# Patient Record
Sex: Male | Born: 1960 | Race: White | Hispanic: No | Marital: Married | Smoking: Light tobacco smoker
Health system: Southern US, Community
[De-identification: ages and names within clinical notes are randomized; demographics above are authoritative.]

## PROBLEM LIST (undated history)

## (undated) DIAGNOSIS — N529 Male erectile dysfunction, unspecified: Secondary | ICD-10-CM

## (undated) DIAGNOSIS — I10 Essential (primary) hypertension: Secondary | ICD-10-CM

## (undated) DIAGNOSIS — R0600 Dyspnea, unspecified: Secondary | ICD-10-CM

## (undated) DIAGNOSIS — Z8679 Personal history of other diseases of the circulatory system: Secondary | ICD-10-CM

## (undated) DIAGNOSIS — Z9289 Personal history of other medical treatment: Secondary | ICD-10-CM

## (undated) DIAGNOSIS — C61 Malignant neoplasm of prostate: Secondary | ICD-10-CM

## (undated) DIAGNOSIS — K219 Gastro-esophageal reflux disease without esophagitis: Secondary | ICD-10-CM

## (undated) DIAGNOSIS — Z87442 Personal history of urinary calculi: Secondary | ICD-10-CM

## (undated) DIAGNOSIS — Z8619 Personal history of other infectious and parasitic diseases: Secondary | ICD-10-CM

## (undated) DIAGNOSIS — Z9189 Other specified personal risk factors, not elsewhere classified: Secondary | ICD-10-CM

## (undated) DIAGNOSIS — E119 Type 2 diabetes mellitus without complications: Secondary | ICD-10-CM

## (undated) DIAGNOSIS — S83206A Unspecified tear of unspecified meniscus, current injury, right knee, initial encounter: Secondary | ICD-10-CM

## (undated) DIAGNOSIS — Z87448 Personal history of other diseases of urinary system: Secondary | ICD-10-CM

## (undated) DIAGNOSIS — Z973 Presence of spectacles and contact lenses: Secondary | ICD-10-CM

## (undated) HISTORY — PX: EXTRACORPOREAL SHOCK WAVE LITHOTRIPSY: SHX1557

## (undated) HISTORY — DX: Personal history of urinary calculi: Z87.442

## (undated) HISTORY — PX: PERCUTANEOUS NEPHROSTOLITHOTOMY: SHX2207

## (undated) HISTORY — PX: URETEROLITHOTOMY: SHX71

## (undated) HISTORY — DX: Essential (primary) hypertension: I10

## (undated) HISTORY — DX: Type 2 diabetes mellitus without complications: E11.9

## (undated) HISTORY — PX: TRIGGER FINGER RELEASE: SHX641

## (undated) HISTORY — DX: Male erectile dysfunction, unspecified: N52.9

## (undated) HISTORY — PX: CATARACT EXTRACTION W/ INTRAOCULAR LENS  IMPLANT, BILATERAL: SHX1307

## (undated) HISTORY — PX: PROSTATE BIOPSY: SHX241

## (undated) HISTORY — PX: ORCHIECTOMY: SHX2116

## (undated) HISTORY — PX: TONSILLECTOMY AND ADENOIDECTOMY: SUR1326

---

## 1998-08-13 ENCOUNTER — Encounter: Payer: Self-pay | Admitting: Urology

## 1998-08-13 ENCOUNTER — Emergency Department (HOSPITAL_COMMUNITY): Admission: EM | Admit: 1998-08-13 | Discharge: 1998-08-13 | Payer: Self-pay | Admitting: Emergency Medicine

## 1998-08-13 ENCOUNTER — Ambulatory Visit (HOSPITAL_COMMUNITY): Admission: RE | Admit: 1998-08-13 | Discharge: 1998-08-13 | Payer: Self-pay | Admitting: Urology

## 2005-02-27 ENCOUNTER — Encounter: Admission: RE | Admit: 2005-02-27 | Discharge: 2005-02-27 | Payer: Self-pay | Admitting: Urology

## 2005-03-05 ENCOUNTER — Ambulatory Visit (HOSPITAL_COMMUNITY): Admission: RE | Admit: 2005-03-05 | Discharge: 2005-03-05 | Payer: Self-pay | Admitting: Urology

## 2005-03-05 ENCOUNTER — Ambulatory Visit (HOSPITAL_BASED_OUTPATIENT_CLINIC_OR_DEPARTMENT_OTHER): Admission: RE | Admit: 2005-03-05 | Discharge: 2005-03-05 | Payer: Self-pay | Admitting: Urology

## 2010-09-10 ENCOUNTER — Other Ambulatory Visit: Payer: Self-pay | Admitting: Gastroenterology

## 2010-09-10 DIAGNOSIS — B182 Chronic viral hepatitis C: Secondary | ICD-10-CM

## 2010-10-03 ENCOUNTER — Ambulatory Visit
Admission: RE | Admit: 2010-10-03 | Discharge: 2010-10-03 | Disposition: A | Discharge status: Home / Self Care | Payer: 59 | Source: Ambulatory Visit | Attending: Gastroenterology | Admitting: Gastroenterology

## 2010-10-03 DIAGNOSIS — B182 Chronic viral hepatitis C: Secondary | ICD-10-CM

## 2011-11-03 ENCOUNTER — Ambulatory Visit (INDEPENDENT_AMBULATORY_CARE_PROVIDER_SITE_OTHER): Payer: 59 | Admitting: Internal Medicine

## 2011-11-03 ENCOUNTER — Encounter: Payer: Self-pay | Admitting: Internal Medicine

## 2011-11-03 VITALS — BP 121/79 | HR 68 | Temp 98.4°F | Resp 16 | Ht 67.25 in | Wt 232.8 lb

## 2011-11-03 DIAGNOSIS — I10 Essential (primary) hypertension: Secondary | ICD-10-CM

## 2011-11-03 DIAGNOSIS — B1920 Unspecified viral hepatitis C without hepatic coma: Secondary | ICD-10-CM

## 2011-11-03 DIAGNOSIS — N200 Calculus of kidney: Secondary | ICD-10-CM

## 2011-11-03 DIAGNOSIS — Z79899 Other long term (current) drug therapy: Secondary | ICD-10-CM

## 2011-11-03 DIAGNOSIS — N529 Male erectile dysfunction, unspecified: Secondary | ICD-10-CM

## 2011-11-03 MED ORDER — LISINOPRIL-HYDROCHLOROTHIAZIDE 20-12.5 MG PO TABS: 1.0000 | ORAL_TABLET | Freq: Every day | ORAL | Status: DC

## 2011-11-03 MED ORDER — SILDENAFIL CITRATE 100 MG PO TABS: 100.0000 mg | ORAL_TABLET | Freq: Every day | ORAL | Status: DC | PRN

## 2011-11-04 NOTE — Progress Notes (Signed)
  Subjective:    Patient ID: Travis Bowman, male    DOB: [birth year], 51 y.o.   MRN: [id number]  HPI  Doing well  Review of Systems     Objective:   Physical Exam  Normal      Assessment & Plan:  Continue same rx

## 2011-12-22 ENCOUNTER — Other Ambulatory Visit: Payer: Self-pay | Admitting: Family Medicine

## 2011-12-22 DIAGNOSIS — R140 Abdominal distension (gaseous): Secondary | ICD-10-CM

## 2011-12-23 ENCOUNTER — Ambulatory Visit
Admission: RE | Admit: 2011-12-23 | Discharge: 2011-12-23 | Disposition: A | Discharge status: Home / Self Care | Payer: 59 | Source: Ambulatory Visit | Attending: Family Medicine | Admitting: Family Medicine

## 2011-12-23 DIAGNOSIS — R140 Abdominal distension (gaseous): Secondary | ICD-10-CM

## 2011-12-24 ENCOUNTER — Other Ambulatory Visit: Payer: Self-pay | Admitting: Family Medicine

## 2011-12-24 DIAGNOSIS — K869 Disease of pancreas, unspecified: Secondary | ICD-10-CM

## 2011-12-25 ENCOUNTER — Ambulatory Visit
Admission: RE | Admit: 2011-12-25 | Discharge: 2011-12-25 | Disposition: A | Discharge status: Home / Self Care | Payer: 59 | Source: Ambulatory Visit | Attending: Family Medicine | Admitting: Family Medicine

## 2011-12-25 DIAGNOSIS — K869 Disease of pancreas, unspecified: Secondary | ICD-10-CM

## 2011-12-25 MED ORDER — IOHEXOL 350 MG/ML SOLN
125.0000 mL | Freq: Once | INTRAVENOUS | Status: AC | PRN
  Administered 2011-12-25: 125 mL via INTRAVENOUS

## 2012-01-29 ENCOUNTER — Other Ambulatory Visit: Payer: Self-pay | Admitting: Internal Medicine

## 2013-07-22 ENCOUNTER — Other Ambulatory Visit: Payer: Self-pay | Admitting: Gastroenterology

## 2013-07-22 DIAGNOSIS — B1920 Unspecified viral hepatitis C without hepatic coma: Secondary | ICD-10-CM

## 2013-07-27 ENCOUNTER — Ambulatory Visit
Admission: RE | Admit: 2013-07-27 | Discharge: 2013-07-27 | Disposition: A | Discharge status: Home / Self Care | Payer: 59 | Source: Ambulatory Visit | Attending: Gastroenterology | Admitting: Gastroenterology

## 2013-07-27 DIAGNOSIS — B1920 Unspecified viral hepatitis C without hepatic coma: Secondary | ICD-10-CM

## 2013-08-19 ENCOUNTER — Other Ambulatory Visit: Payer: Self-pay | Admitting: Gastroenterology

## 2013-08-19 DIAGNOSIS — B1920 Unspecified viral hepatitis C without hepatic coma: Secondary | ICD-10-CM

## 2013-08-26 ENCOUNTER — Ambulatory Visit
Admission: RE | Admit: 2013-08-26 | Discharge: 2013-08-26 | Disposition: A | Discharge status: Home / Self Care | Payer: 59 | Source: Ambulatory Visit | Attending: Gastroenterology | Admitting: Gastroenterology

## 2013-08-26 ENCOUNTER — Other Ambulatory Visit: Payer: 59

## 2013-08-26 DIAGNOSIS — B1920 Unspecified viral hepatitis C without hepatic coma: Secondary | ICD-10-CM

## 2013-08-26 MED ORDER — GADOXETATE DISODIUM 0.25 MMOL/ML IV SOLN
10.0000 mL | Freq: Once | INTRAVENOUS | Status: AC | PRN
  Administered 2013-08-26: 10 mL via INTRAVENOUS

## 2013-11-11 ENCOUNTER — Ambulatory Visit
Admission: RE | Admit: 2013-11-11 | Discharge: 2013-11-11 | Disposition: A | Discharge status: Home / Self Care | Payer: 59 | Source: Ambulatory Visit | Attending: Family Medicine | Admitting: Family Medicine

## 2013-11-11 ENCOUNTER — Other Ambulatory Visit: Payer: Self-pay | Admitting: Family Medicine

## 2013-11-11 DIAGNOSIS — M25439 Effusion, unspecified wrist: Secondary | ICD-10-CM

## 2013-11-25 ENCOUNTER — Ambulatory Visit
Admission: RE | Admit: 2013-11-25 | Discharge: 2013-11-25 | Disposition: A | Discharge status: Home / Self Care | Payer: 59 | Source: Ambulatory Visit | Attending: Family Medicine | Admitting: Family Medicine

## 2013-11-25 ENCOUNTER — Other Ambulatory Visit: Payer: Self-pay | Admitting: Family Medicine

## 2013-11-25 DIAGNOSIS — R0602 Shortness of breath: Secondary | ICD-10-CM

## 2013-12-05 ENCOUNTER — Inpatient Hospital Stay (HOSPITAL_COMMUNITY)
Admission: AD | Admit: 2013-12-05 | Discharge: 2013-12-09 | DRG: 682 | Disposition: A | Discharge status: Home / Self Care | Payer: 59 | Source: Ambulatory Visit | Attending: Internal Medicine | Admitting: Internal Medicine

## 2013-12-05 ENCOUNTER — Encounter (HOSPITAL_COMMUNITY): Payer: Self-pay | Admitting: *Deleted

## 2013-12-05 DIAGNOSIS — N049 Nephrotic syndrome with unspecified morphologic changes: Secondary | ICD-10-CM | POA: Diagnosis present

## 2013-12-05 DIAGNOSIS — Z8249 Family history of ischemic heart disease and other diseases of the circulatory system: Secondary | ICD-10-CM

## 2013-12-05 DIAGNOSIS — R809 Proteinuria, unspecified: Secondary | ICD-10-CM

## 2013-12-05 DIAGNOSIS — R7989 Other specified abnormal findings of blood chemistry: Secondary | ICD-10-CM

## 2013-12-05 DIAGNOSIS — I10 Essential (primary) hypertension: Secondary | ICD-10-CM

## 2013-12-05 DIAGNOSIS — Z9079 Acquired absence of other genital organ(s): Secondary | ICD-10-CM

## 2013-12-05 DIAGNOSIS — I509 Heart failure, unspecified: Secondary | ICD-10-CM | POA: Diagnosis present

## 2013-12-05 DIAGNOSIS — I129 Hypertensive chronic kidney disease with stage 1 through stage 4 chronic kidney disease, or unspecified chronic kidney disease: Secondary | ICD-10-CM | POA: Diagnosis present

## 2013-12-05 DIAGNOSIS — N189 Chronic kidney disease, unspecified: Secondary | ICD-10-CM | POA: Diagnosis present

## 2013-12-05 DIAGNOSIS — E1110 Type 2 diabetes mellitus with ketoacidosis without coma: Secondary | ICD-10-CM

## 2013-12-05 DIAGNOSIS — Z79899 Other long term (current) drug therapy: Secondary | ICD-10-CM

## 2013-12-05 DIAGNOSIS — B1920 Unspecified viral hepatitis C without hepatic coma: Secondary | ICD-10-CM | POA: Diagnosis present

## 2013-12-05 DIAGNOSIS — R319 Hematuria, unspecified: Secondary | ICD-10-CM

## 2013-12-05 DIAGNOSIS — B182 Chronic viral hepatitis C: Secondary | ICD-10-CM | POA: Diagnosis present

## 2013-12-05 DIAGNOSIS — N200 Calculus of kidney: Secondary | ICD-10-CM

## 2013-12-05 DIAGNOSIS — I160 Hypertensive urgency: Secondary | ICD-10-CM

## 2013-12-05 DIAGNOSIS — E119 Type 2 diabetes mellitus without complications: Secondary | ICD-10-CM

## 2013-12-05 DIAGNOSIS — Z87891 Personal history of nicotine dependence: Secondary | ICD-10-CM

## 2013-12-05 DIAGNOSIS — N179 Acute kidney failure, unspecified: Principal | ICD-10-CM | POA: Diagnosis present

## 2013-12-05 DIAGNOSIS — E1310 Other specified diabetes mellitus with ketoacidosis without coma: Secondary | ICD-10-CM

## 2013-12-05 LAB — COMPREHENSIVE METABOLIC PANEL
ALT: 14 U/L (ref 0–53)
AST: 19 U/L (ref 0–37)
Albumin: 3.1 g/dL — ABNORMAL LOW (ref 3.5–5.2)
Alkaline Phosphatase: 35 U/L — ABNORMAL LOW (ref 39–117)
BUN: 44 mg/dL — ABNORMAL HIGH (ref 6–23)
CO2: 24 mEq/L (ref 19–32)
Calcium: 10 mg/dL (ref 8.4–10.5)
Chloride: 104 mEq/L (ref 96–112)
Creatinine, Ser: 1.42 mg/dL — ABNORMAL HIGH (ref 0.50–1.35)
GFR calc Af Amer: 64 mL/min — ABNORMAL LOW (ref >90)
GFR calc non Af Amer: 55 mL/min — ABNORMAL LOW (ref >90)
Glucose, Bld: 96 mg/dL (ref 70–99)
Potassium: 4 mEq/L (ref 3.7–5.3)
Sodium: 141 mEq/L (ref 137–147)
Total Bilirubin: 0.5 mg/dL (ref 0.3–1.2)
Total Protein: 6.5 g/dL (ref 6.0–8.3)

## 2013-12-05 LAB — CBC WITH DIFFERENTIAL/PLATELET
Basophils Absolute: 0 10*3/uL (ref 0.0–0.1)
Basophils Relative: 0 % (ref 0–1)
Eosinophils Absolute: 0.3 10*3/uL (ref 0.0–0.7)
Eosinophils Relative: 3 % (ref 0–5)
HCT: 33.4 % — ABNORMAL LOW (ref 39.0–52.0)
Hemoglobin: 11.2 g/dL — ABNORMAL LOW (ref 13.0–17.0)
Lymphocytes Relative: 33 % (ref 12–46)
Lymphs Abs: 2.7 10*3/uL (ref 0.7–4.0)
MCH: 30.5 pg (ref 26.0–34.0)
MCHC: 33.5 g/dL (ref 30.0–36.0)
MCV: 91 fL (ref 78.0–100.0)
Monocytes Absolute: 0.6 10*3/uL (ref 0.1–1.0)
Monocytes Relative: 8 % (ref 3–12)
Neutro Abs: 4.7 10*3/uL (ref 1.7–7.7)
Neutrophils Relative %: 56 % (ref 43–77)
Platelets: 140 10*3/uL — ABNORMAL LOW (ref 150–400)
RBC: 3.67 MIL/uL — ABNORMAL LOW (ref 4.22–5.81)
RDW: 13.5 % (ref 11.5–15.5)
WBC: 8.3 10*3/uL (ref 4.0–10.5)

## 2013-12-05 LAB — GLUCOSE, CAPILLARY: Glucose-Capillary: 97 mg/dL (ref 70–99)

## 2013-12-05 LAB — PHOSPHORUS: Phosphorus: 5.7 mg/dL — ABNORMAL HIGH (ref 2.3–4.6)

## 2013-12-05 LAB — PRO B NATRIURETIC PEPTIDE: Pro B Natriuretic peptide (BNP): 1188 pg/mL — ABNORMAL HIGH (ref 0–125)

## 2013-12-05 LAB — TROPONIN I: Troponin I: <0.3 ng/mL (ref <0.30)

## 2013-12-05 LAB — MAGNESIUM: Magnesium: 2.1 mg/dL (ref 1.5–2.5)

## 2013-12-05 MED ORDER — FUROSEMIDE 80 MG PO TABS
80.0000 mg | ORAL_TABLET | Freq: Two times a day (BID) | ORAL | Status: DC
  Administered 2013-12-06 – 2013-12-09 (×7): 80 mg via ORAL
  Filled 2013-12-05 (×9): qty 1

## 2013-12-05 MED ORDER — ACETAMINOPHEN 650 MG RE SUPP: 650.0000 mg | Freq: Four times a day (QID) | RECTAL | Status: DC | PRN

## 2013-12-05 MED ORDER — ACETAMINOPHEN 325 MG PO TABS: 650.0000 mg | ORAL_TABLET | Freq: Four times a day (QID) | ORAL | Status: DC | PRN

## 2013-12-05 MED ORDER — INSULIN ASPART 100 UNIT/ML ~~LOC~~ SOLN
0.0000 [IU] | SUBCUTANEOUS | Status: DC
  Administered 2013-12-06: 1 [IU] via SUBCUTANEOUS
  Administered 2013-12-07: 5 [IU] via SUBCUTANEOUS
  Administered 2013-12-08: 2 [IU] via SUBCUTANEOUS
  Administered 2013-12-08: 5 [IU] via SUBCUTANEOUS
  Administered 2013-12-08 (×3): 2 [IU] via SUBCUTANEOUS

## 2013-12-05 MED ORDER — CLONIDINE HCL 0.1 MG PO TABS
0.1000 mg | ORAL_TABLET | Freq: Two times a day (BID) | ORAL | Status: DC
  Filled 2013-12-05 (×2): qty 1

## 2013-12-05 MED ORDER — SODIUM CHLORIDE 0.9 % IJ SOLN
3.0000 mL | Freq: Two times a day (BID) | INTRAMUSCULAR | Status: DC
  Administered 2013-12-05 – 2013-12-09 (×7): 3 mL via INTRAVENOUS

## 2013-12-05 MED ORDER — SODIUM CHLORIDE 0.9 % IJ SOLN: 3.0000 mL | INTRAMUSCULAR | Status: DC | PRN

## 2013-12-05 MED ORDER — HYDROCODONE-ACETAMINOPHEN 5-325 MG PO TABS: 1.0000 | ORAL_TABLET | ORAL | Status: DC | PRN

## 2013-12-05 MED ORDER — DOCUSATE SODIUM 100 MG PO CAPS
100.0000 mg | ORAL_CAPSULE | Freq: Two times a day (BID) | ORAL | Status: DC
Start: 2013-12-05 — End: 2013-12-06
  Filled 2013-12-05 (×3): qty 1

## 2013-12-05 MED ORDER — ONDANSETRON HCL 4 MG/2ML IJ SOLN: 4.0000 mg | Freq: Four times a day (QID) | INTRAMUSCULAR | Status: DC | PRN

## 2013-12-05 MED ORDER — ONDANSETRON HCL 4 MG PO TABS: 4.0000 mg | ORAL_TABLET | Freq: Four times a day (QID) | ORAL | Status: DC | PRN

## 2013-12-05 MED ORDER — LISINOPRIL 20 MG PO TABS
20.0000 mg | ORAL_TABLET | Freq: Every day | ORAL | Status: DC
  Filled 2013-12-05 (×2): qty 1

## 2013-12-05 MED ORDER — HYDRALAZINE HCL 20 MG/ML IJ SOLN
10.0000 mg | INTRAMUSCULAR | Status: DC | PRN
  Filled 2013-12-05 (×2): qty 0.5

## 2013-12-05 MED ORDER — SODIUM CHLORIDE 0.9 % IV SOLN: 250.0000 mL | INTRAVENOUS | Status: DC | PRN

## 2013-12-05 MED ORDER — AMLODIPINE BESYLATE 10 MG PO TABS
10.0000 mg | ORAL_TABLET | Freq: Every day | ORAL | Status: DC
  Administered 2013-12-06 – 2013-12-08 (×3): 10 mg via ORAL
  Filled 2013-12-05 (×4): qty 1

## 2013-12-05 NOTE — H&P (Signed)
PCP:  Lujean Amel, MD  [facility]  Chief Complaint:  Direct admit for further work up  HPI: Travis Bowman is a 53 y.o. male   has a past medical history of Hypertension; Hep C w/o coma, chronic; and ED (erectile dysfunction).   Patient have had 1 week hx of leg swelling and shortness of breath. CXR showed evidence of CHF. Patient states that  His PCP found blood and protein in his urine and he was sent to nephrology for further evaluation. Priror to this patient had HTN that was under good control but for the past 1 week has deteriorated. He was started on lasix 80 mg BID, amlodipine and clonidine. He denies any chest pain or tightness. Denies ever having a cardiac echo done.  Nephrology arranged for direct admit for further work up and possible kidney biopsy in AM Patient has hx of chronic hep C and was recently treated with harvoni.    Hospitalist was called for admission for further work up of possible nephrotic syndrome  Review of Systems:    Pertinent positives include:  Bilateral lower extremity swelling  shortness of breath at rest.foamy urine  Constitutional:  No weight loss, night sweats, Fevers, chills, fatigue, weight loss  HEENT:  No headaches, Difficulty swallowing,Tooth/dental problems,Sore throat,  No sneezing, itching, ear ache, nasal congestion, post nasal drip,  Cardio-vascular:  No chest pain, Orthopnea, PND, anasarca, dizziness, palpitations.no  GI:  No heartburn, indigestion, abdominal pain, nausea, vomiting, diarrhea, change in bowel habits, loss of appetite, melena, blood in stool, hematemesis Resp:   No dyspnea on exertion, No excess mucus, no productive cough, No non-productive cough, No coughing up of blood.No change in color of mucus.No wheezing. Skin:  no rash or lesions. No jaundice GU:  no dysuria, change in color of urine, no urgency or frequency. No straining to urinate.  No flank pain.  Musculoskeletal:  No joint pain or no  joint swelling. No decreased range of motion. No back pain.  Psych:  No change in mood or affect. No depression or anxiety. No memory loss.  Neuro: no localizing neurological complaints, no tingling, no weakness, no double vision, no gait abnormality, no slurred speech, no confusion  Otherwise ROS are negative except for above, 10 systems were reviewed  Past Medical History: Past Medical History  Diagnosis Date  . Hypertension   . Hep C w/o coma, chronic   . ED (erectile dysfunction)    Past Surgical History  Procedure Laterality Date  . Eye surgery    . Kidney stone surgery    . Testicle removed      age 89  . Tonsillectomy      adenoids removed also     Medications: Prior to Admission medications   Medication Sig Start Date End Date Taking? Authorizing Provider  acetaminophen (TYLENOL) 500 MG tablet Take 500-1,000 mg by mouth every 6 (six) hours as needed for headache.   Yes Historical Provider, MD  amLODipine (NORVASC) 10 MG tablet Take 10 mg by mouth at bedtime.  12/05/13  Yes Historical Provider, MD  cloNIDine (CATAPRES) 0.1 MG tablet Take 0.1 mg by mouth 2 (two) times daily.  12/05/13  Yes Historical Provider, MD  furosemide (LASIX) 40 MG tablet Take 80 mg by mouth 2 (two) times daily.  11/25/13  Yes Historical Provider, MD  glimepiride (AMARYL) 1 MG tablet Take 1 mg by mouth daily with breakfast.  12/05/13  Yes Historical Provider, MD  lisinopril (PRINIVIL,ZESTRIL) 20 MG tablet  Take 20 mg by mouth at bedtime.  11/25/13  Yes Historical Provider, MD  sildenafil (VIAGRA) 100 MG tablet Take 100 mg by mouth daily as needed for erectile dysfunction.   Yes Historical Provider, MD    Allergies:   Allergies  Allergen Reactions  . Morphine And Related Nausea And Vomiting    Social History:  Ambulatory   independently   Lives at home   With family     reports that he quit smoking about 8 years ago. He has never used smokeless tobacco. He reports that he drinks alcohol. He  reports that he does not use illicit drugs.    Family History: family history includes Alcohol abuse in his father; CAD in his mother; Stroke in his mother.    Physical Exam: Patient Vitals for the past 24 hrs:  BP Temp Temp src Pulse Resp SpO2 Height Weight  12/05/13 2244 173/90 mmHg 98 F (36.7 C) Oral 63 20 93 % - -  12/05/13 1851 195/102 mmHg 98.7 F (37.1 C) Oral 72 20 95 % 5\' 8"  (1.727 m) 104.327 kg (230 lb)    1. General:  in No Acute distress 2. Psychological: Alert and Oriented 3. Head/ENT:   Moist  Mucous Membranes                          Head Non traumatic, neck supple                          Normal  Dentition 4. SKIN: normal  skin turgor,  Skin clean Dry and intact no rash 5. Heart: Regular rate and rhythm no Murmur, Rub or gallop 6. Lungs:   no wheezes mild crackles   7. Abdomen: Soft, non-tender, Non distended 8. Lower extremities: no clubbing, cyanosis, trace edema 9. Neurologically Grossly intact, moving all 4 extremities equally 10. MSK: Normal range of motion  body mass index is 34.98 kg/(m^2).   Labs on Admission:  No results found for this basename: NA, K, CL, CO2, GLUCOSE, BUN, CREATININE, CALCIUM, MG, PHOS,  in the last 72 hours No results found for this basename: AST, ALT, ALKPHOS, BILITOT, PROT, ALBUMIN,  in the last 72 hours No results found for this basename: LIPASE, AMYLASE,  in the last 72 hours No results found for this basename: WBC, NEUTROABS, HGB, HCT, MCV, PLT,  in the last 72 hours No results found for this basename: CKTOTAL, CKMB, CKMBINDEX, TROPONINI,  in the last 72 hours No results found for this basename: TSH, T4TOTAL, FREET3, T3FREE, THYROIDAB,  in the last 72 hours No results found for this basename: VITAMINB12, FOLATE, FERRITIN, TIBC, IRON, RETICCTPCT,  in the last 72 hours No results found for this basename: HGBA1C    CrCl is unknown because no creatinine reading has been taken. ABG No results found for this basename: phart,  pco2, po2, hco3, tco2, acidbasedef, o2sat     No results found for this basename: DDIMER    BNP (last 3 results) No results found for this basename: PROBNP,  in the last 8760 hours  Filed Weights   12/05/13 1851  Weight: 104.327 kg (230 lb)   Cultures: No results found for this basename: sdes, specrequest, cult, reptstatus   Radiological Exams on Admission: No results found.  Chart has been reviewed  Assessment/Plan  53 year old gentleman with reportedly history of leg swelling and evidence of pulmonary edema over the past 2 weeks with proteinuria  and hematouria with worsening hypertension direct admit from nephrology for possible kidney biopsy in the morning  Present on Admission:  . Hypertensive urgency, malignant - will continue previously prescribed medications including Norvasc clonidine Lasix and lisinopril currently blood pressure improving  . DM (diabetes mellitus) type 2, uncontrolled, with ketoacidosis - sensitive sliding scale hold by mouth meds Proteinuria - as per renal may need renal biopsy pneumonia will keep n.p.o. obtain basic labs check INR History hepatitis C reportedly has been treated 2 weeks ago with Harvoni it seems to be leg swelling developed after the treatment. It is unclear this could be a complication of the therapy.  Prophylaxis: SCD   CODE STATUS:  FULL CODE   Other plan as per orders.  I have spent a total of 55 min on this admission  Travis Bowman 12/05/2013, 10:48 PM  [facility]  Pager [phone]   If 7AM-7PM, please contact the day team taking care of the patient  [email or web address]  Password [email or web address]

## 2013-12-05 NOTE — Significant Event (Signed)
Call received from Dr. Joelyn Oms of nephrology about this 53 year male with recent diagnosis of hepatitis he presented to his office with persisting edema hypertension and proteinuria found to have markedly elevated pressures 200/100 despite IV Lasix being given at some point. Patient is hemodynamically stable vital signs are stable and MedSurg bed was requested. Patient has been ordered multiple labs including serologies to rule out rapid aggressive pulmonary nephritis. Dr. Adin Hector opinion is that patient can be treated with by mouth antihypertensives including Lasix and is stable for MedSurg floor. Patient accepted to MedSurg bed team can please page or call flow manager on arrival to the floor for assignement of the patient for admission  Verneita Griffes, MD [facility] (P) [phone]

## 2013-12-06 ENCOUNTER — Encounter (HOSPITAL_COMMUNITY): Payer: Self-pay | Admitting: Radiology

## 2013-12-06 DIAGNOSIS — R799 Abnormal finding of blood chemistry, unspecified: Secondary | ICD-10-CM

## 2013-12-06 DIAGNOSIS — I517 Cardiomegaly: Secondary | ICD-10-CM

## 2013-12-06 DIAGNOSIS — R809 Proteinuria, unspecified: Secondary | ICD-10-CM | POA: Diagnosis present

## 2013-12-06 DIAGNOSIS — E119 Type 2 diabetes mellitus without complications: Secondary | ICD-10-CM

## 2013-12-06 DIAGNOSIS — R7989 Other specified abnormal findings of blood chemistry: Secondary | ICD-10-CM | POA: Diagnosis present

## 2013-12-06 DIAGNOSIS — R319 Hematuria, unspecified: Secondary | ICD-10-CM

## 2013-12-06 HISTORY — PX: TRANSTHORACIC ECHOCARDIOGRAM: SHX275

## 2013-12-06 LAB — CBC
HCT: 33.2 % — ABNORMAL LOW (ref 39.0–52.0)
Hemoglobin: 11.6 g/dL — ABNORMAL LOW (ref 13.0–17.0)
MCH: 31.5 pg (ref 26.0–34.0)
MCHC: 34.9 g/dL (ref 30.0–36.0)
MCV: 90.2 fL (ref 78.0–100.0)
Platelets: 150 10*3/uL (ref 150–400)
RBC: 3.68 MIL/uL — ABNORMAL LOW (ref 4.22–5.81)
RDW: 13.5 % (ref 11.5–15.5)
WBC: 8 10*3/uL (ref 4.0–10.5)

## 2013-12-06 LAB — COMPREHENSIVE METABOLIC PANEL
ALT: 14 U/L (ref 0–53)
AST: 20 U/L (ref 0–37)
Albumin: 3.1 g/dL — ABNORMAL LOW (ref 3.5–5.2)
Alkaline Phosphatase: 36 U/L — ABNORMAL LOW (ref 39–117)
BUN: 42 mg/dL — ABNORMAL HIGH (ref 6–23)
CO2: 24 mEq/L (ref 19–32)
Calcium: 9.8 mg/dL (ref 8.4–10.5)
Chloride: 103 mEq/L (ref 96–112)
Creatinine, Ser: 1.32 mg/dL (ref 0.50–1.35)
GFR calc Af Amer: 70 mL/min — ABNORMAL LOW (ref >90)
GFR calc non Af Amer: 61 mL/min — ABNORMAL LOW (ref >90)
Glucose, Bld: 101 mg/dL — ABNORMAL HIGH (ref 70–99)
Potassium: 3.9 mEq/L (ref 3.7–5.3)
Sodium: 140 mEq/L (ref 137–147)
Total Bilirubin: 0.5 mg/dL (ref 0.3–1.2)
Total Protein: 6.5 g/dL (ref 6.0–8.3)

## 2013-12-06 LAB — URINALYSIS, ROUTINE W REFLEX MICROSCOPIC
Bilirubin Urine: NEGATIVE
Glucose, UA: NEGATIVE mg/dL
Ketones, ur: NEGATIVE mg/dL
Leukocytes, UA: NEGATIVE
Nitrite: NEGATIVE
Protein, ur: 100 mg/dL — AB
Specific Gravity, Urine: 1.006 (ref 1.005–1.030)
Urobilinogen, UA: 0.2 mg/dL (ref 0.0–1.0)
pH: 6.5 (ref 5.0–8.0)

## 2013-12-06 LAB — URINE MICROSCOPIC-ADD ON

## 2013-12-06 LAB — GLUCOSE, CAPILLARY
Glucose-Capillary: 98 mg/dL (ref 70–99)
Glucose-Capillary: 107 mg/dL — ABNORMAL HIGH (ref 70–99)
Glucose-Capillary: 100 mg/dL — ABNORMAL HIGH (ref 70–99)
Glucose-Capillary: 109 mg/dL — ABNORMAL HIGH (ref 70–99)
Glucose-Capillary: 128 mg/dL — ABNORMAL HIGH (ref 70–99)

## 2013-12-06 LAB — PROTIME-INR
INR: 1.02 (ref 0.00–1.49)
Prothrombin Time: 13.2 seconds (ref 11.6–15.2)

## 2013-12-06 LAB — HEMOGLOBIN A1C
Hgb A1c MFr Bld: 6 % — ABNORMAL HIGH (ref <5.7)
Mean Plasma Glucose: 126 mg/dL — ABNORMAL HIGH (ref <117)

## 2013-12-06 LAB — TSH: TSH: 1.83 u[IU]/mL (ref 0.350–4.500)

## 2013-12-06 MED ORDER — CLONIDINE HCL 0.1 MG PO TABS
0.1000 mg | ORAL_TABLET | Freq: Three times a day (TID) | ORAL | Status: DC
  Administered 2013-12-06 (×2): 0.1 mg via ORAL
  Filled 2013-12-06 (×5): qty 1

## 2013-12-06 MED ORDER — HYDRALAZINE HCL 20 MG/ML IJ SOLN
10.0000 mg | Freq: Once | INTRAMUSCULAR | Status: AC
  Administered 2013-12-06: 10 mg via INTRAVENOUS
  Filled 2013-12-06: qty 0.5

## 2013-12-06 NOTE — Progress Notes (Addendum)
Progress Note   Travis Bowman IRJ:[id number] DOB: 1961-04-06 DOA: 12/05/2013 PCP: Lujean Amel, MD   Brief Narrative:   Travis Bowman is an 53 y.o. male with a PMH of hypertension and hepatitis C who was directly admitted on 12/05/13 with a chief complaint of one week history of leg swelling, worsening hypertension and shortness of breath. Upon initial evaluation in the hospital, chest x-ray showed CHF. He was also noted to have blood and protein in his urine by his PCP, and was sent to nephrology for further evaluation, who arranged for his erectile admission for further evaluation and possible kidney biopsy.  Assessment/Plan:   Principal Problem: Hypertensive urgency, malignant / elevated BNP  Continue Norvasc, clonidine, Lasix and lisinopril. Increase clonidine to 3 times a day.  Continue as needed hydralazine.  Followup 2-D echocardiogram.  Active Problems: Hepatitis C  Treated with Harvoni 2 weeks ago.  DM (diabetes mellitus) type 2  Continue insulin sensitive sliding scale.  CBGs 97-98.  [place]  Nephrology on board. Possible kidney biopsy today, if his blood pressure can be better controlled.  Albumin 3.1. 100 mg/dL protein on urinalysis, with small hemoglobin (7-10 RBCs per high-power field).  DVT Prophylaxis  SCDs ordered.  Code Status: Full. Family Communication: Wife at bedside. Disposition Plan: Home when stable.   IV Access:    Peripheral IV   Procedures:    None.   Medical Consultants:    Nephrology.   Other Consultants:    None.   Anti-Infectives:    None.  Subjective:   Travis Bowman has had some shortness of breath and lower extremity swelling. Otherwise, feels well.  Objective:    Filed Vitals:   12/05/13 1851 12/05/13 2244 12/06/13 0224 12/06/13 0443  BP: 195/102 173/90 162/87 173/91  Pulse: 72 63 66 67  Temp: 98.7 F (37.1 C) 98 F (36.7 C) 98.4 F (36.9 C) 98.2 F (36.8 C)  TempSrc:  Oral Oral Oral Oral  Resp: 20 20 18 18   Height: 5\' 8"  (1.727 m)     Weight: 104.327 kg (230 lb)     SpO2: 95% 93% 97% 93%    Intake/Output Summary (Last 24 hours) at 12/06/13 0809 Last data filed at 12/05/13 2100  Gross per 24 hour  Intake    240 ml  Output      0 ml  Net    240 ml    Exam: Gen:  NAD Cardiovascular:  RRR, No M/R/G Respiratory:  Lungs CTAB Gastrointestinal:  Abdomen soft, NT/ND, + BS Extremities:  2+ edema   Data Reviewed:   Labs: Basic Metabolic Panel:  Recent Labs Lab 12/05/13 2257 12/06/13 0425  NA 141 140  K 4.0 3.9  CL 104 103  CO2 24 24  GLUCOSE 96 101*  BUN 44* 42*  CREATININE 1.42* 1.32  CALCIUM 10.0 9.8  MG 2.1  --   PHOS 5.7*  --    GFR Estimated Creatinine Clearance: 76.7 ml/min (by C-G formula based on Cr of 1.32). Liver Function Tests:  Recent Labs Lab 12/05/13 2257 12/06/13 0425  AST 19 20  ALT 14 14  ALKPHOS 35* 36*  BILITOT 0.5 0.5  PROT 6.5 6.5  ALBUMIN 3.1* 3.1*   Coagulation profile  Recent Labs Lab 12/06/13 0425  INR 1.02    CBC:  Recent Labs Lab 12/05/13 2257 12/06/13 0425  WBC 8.3 8.0  NEUTROABS 4.7  --   HGB 11.2* 11.6*  HCT 33.4* 33.2*  MCV 91.0 90.2  PLT 140* 150   Cardiac Enzymes:  Recent Labs Lab 12/05/13 2257  TROPONINI <0.30   BNP (last 3 results)  Recent Labs  12/05/13 2257  PROBNP 1188.0*   CBG:  Recent Labs Lab 12/05/13 2356 12/06/13 0440  GLUCAP 97 98   Hgb A1c:  Recent Labs  12/05/13 2257  HGBA1C 6.0*   Thyroid function studies:  Recent Labs  12/05/13 2257  TSH 1.830   Microbiology No results found for this or any previous visit (from the past 240 hour(s)).   Radiographs/Studies:   Dg Chest 2 View  11/25/2013   CLINICAL DATA:  Five day history of bilateral lower extremity edema. Acute onset shortness of breath.  EXAM: CHEST  2 VIEW  COMPARISON:  Two-view chest x-ray 02/27/2005.  FINDINGS: Cardiac silhouette upper normal in size to slightly  enlarged. Hilar and mediastinal contours otherwise unremarkable. Mild diffuse interstitial pulmonary edema. Small bilateral pleural effusions. No confluent airspace consolidation. Mild degenerative changes involving the thoracic spine.  IMPRESSION: Mild diffuse interstitial pulmonary edema and small bilateral pleural effusions consistent with mild CHF and/or fluid overload.  These results will be called to the ordering clinician or representative by the Radiologist Assistant, and communication documented in the PACS or zVision Dashboard.   Electronically Signed   By: Evangeline Dakin M.D.   On: 11/25/2013 15:51   Dg Wrist Complete Right  11/11/2013   CLINICAL DATA:  Right wrist effusion  EXAM: RIGHT WRIST - COMPLETE 3+ VIEW  COMPARISON:  None.  FINDINGS: Four views of the right wrist submitted. No acute fracture or subluxation. No radiopaque foreign body.  IMPRESSION: Negative.   Electronically Signed   By: Lahoma Crocker M.D.   On: 11/11/2013 13:58    Medications:   . amLODipine  10 mg Oral QHS  . cloNIDine  0.1 mg Oral BID  . docusate sodium  100 mg Oral BID  . furosemide  80 mg Oral BID  . insulin aspart  0-9 Units Subcutaneous 6 times per day  . lisinopril  20 mg Oral QHS  . sodium chloride  3 mL Intravenous Q12H   Continuous Infusions:   Time spent: 35 minutes with > 50% of time discussing current diagnostic test results, clinical impression and plan of care.    LOS: 1 day   [place]  [facility] Pager [phone]. If unable to reach me by pager, please call my cell phone at [phone].  *Please refer to [email or web address], password [email or web address] to get updated schedule on who will round on this patient, as hospitalists switch teams weekly. If 7PM-7AM, please contact night-coverage at [email or web address], password [email or web address] for any overnight needs.  12/06/2013, 8:09 AM    **Disclaimer: This note was dictated with voice recognition software. Similar sounding words can inadvertently be transcribed and  this note may contain transcription errors which may not have been corrected upon publication of note.**

## 2013-12-06 NOTE — Progress Notes (Signed)
Echocardiogram 2D Echocardiogram has been performed.  Travis Bowman Travis Bowman 12/06/2013, 1:20 PM

## 2013-12-06 NOTE — Progress Notes (Signed)
S: no new CO.  Edema better O:BP 178/87  Pulse 68  Temp(Src) 97.8 F (36.6 C) (Oral)  Resp 16  Ht 5\' 8"  (1.727 m)  Wt 104.327 kg (230 lb)  BMI 34.98 kg/m2  SpO2 97%  Intake/Output Summary (Last 24 hours) at 12/06/13 1231 Last data filed at 12/05/13 2100  Gross per 24 hour  Intake    240 ml  Output      0 ml  Net    240 ml   Weight change:  QDI:YMEBR and alert CVS:RRR Resp: few basilar crackles Abd:+ BS NTND Ext:1-2+ edema NEURO:CNI M&SI OX3 Skin  Follicular, erythematous rash on face   . amLODipine  10 mg Oral QHS  . cloNIDine  0.1 mg Oral TID  . furosemide  80 mg Oral BID  . insulin aspart  0-9 Units Subcutaneous 6 times per day  . lisinopril  20 mg Oral QHS  . sodium chloride  3 mL Intravenous Q12H   No results found. BMET    Component Value Date/Time   NA 140 12/06/2013 0425   K 3.9 12/06/2013 0425   CL 103 12/06/2013 0425   CO2 24 12/06/2013 0425   GLUCOSE 101* 12/06/2013 0425   BUN 42* 12/06/2013 0425   CREATININE 1.32 12/06/2013 0425   CALCIUM 9.8 12/06/2013 0425   GFRNONAA 61* 12/06/2013 0425   GFRAA 70* 12/06/2013 0425   CBC    Component Value Date/Time   WBC 8.0 12/06/2013 0425   RBC 3.68* 12/06/2013 0425   HGB 11.6* 12/06/2013 0425   HCT 33.2* 12/06/2013 0425   PLT 150 12/06/2013 0425   MCV 90.2 12/06/2013 0425   MCH 31.5 12/06/2013 0425   MCHC 34.9 12/06/2013 0425   RDW 13.5 12/06/2013 0425   LYMPHSABS 2.7 12/05/2013 2257   MONOABS 0.6 12/05/2013 2257   EOSABS 0.3 12/05/2013 2257   BASOSABS 0.0 12/05/2013 2257     Assessment: 1. AKI sec nephrotic syndrome.  Pr/Cr [id number]. 2. HTN 3.  Hep C SP 12 weeks of Harvoni  Plan: 1.  He had a full set of serologic studies done in the office and will await the return of those. 2.  Renal Bx planned when BP controlled 3.  Will hold lisinopril for now until define his renal lesion and know that renal fx will be stable 4. Daily Scr 5. Cont lasix   Travis Bowman

## 2013-12-06 NOTE — Care Management Note (Signed)
    Page 1 of 1   12/06/2013     12:14:27 PM CARE MANAGEMENT NOTE 12/06/2013  Patient:  Travis Bowman, Travis Bowman   Account Number:  [id number]  Date Initiated:  12/06/2013  Documentation initiated by:  Sunday Spillers  Subjective/Objective Assessment:   53 yo male admitted with HTN, r/o nephrotic syndrome. PTA lived at home with spouse.     Action/Plan:   Home when stable   Anticipated DC Date:  12/09/2013   Anticipated DC Plan:  [place]  CM consult      Choice offered to / List presented to:             Status of service:  Completed, signed off Medicare Important Message given?  NA - LOS <3 / Initial given by admissions (If response is "NO", the following Medicare IM given date fields will be blank) Date Medicare IM given:   Date Additional Medicare IM given:    Discharge Disposition:  HOME/SELF CARE  Per UR Regulation:  Reviewed for med. necessity/level of care/duration of stay  If discussed at [facility] of Stay Meetings, dates discussed:    Comments:

## 2013-12-06 NOTE — Consult Note (Signed)
HPI: Travis Bowman is an 53 y.o. male who has been admitted with uncontrolled hypertension, ARF, and proteinuria. As part of his workup, a random renal biopsy has been ordered. Chart, PMHx, meds, and relevant imaging reviewed. Pt has been NPO today and is otherwise feeling ok.  Past Medical History:  Past Medical History  Diagnosis Date  . Hypertension   . Hep C w/o coma, chronic   . ED (erectile dysfunction)     Past Surgical History:  Past Surgical History  Procedure Laterality Date  . Eye surgery    . Kidney stone surgery    . Testicle removed      age 46  . Tonsillectomy      adenoids removed also    Family History:  Family History  Problem Relation Age of Onset  . Stroke Mother   . CAD Mother   . Alcohol abuse Father     Social History:  reports that he quit smoking about 8 years ago. He has never used smokeless tobacco. He reports that he drinks alcohol. He reports that he does not use illicit drugs.  Allergies:  Allergies  Allergen Reactions  . Morphine And Related Nausea And Vomiting    Medications:   Medication List    ASK your doctor about these medications       acetaminophen 500 MG tablet  Commonly known as:  TYLENOL  Take 500-1,000 mg by mouth every 6 (six) hours as needed for headache.     amLODipine 10 MG tablet  Commonly known as:  NORVASC  Take 10 mg by mouth at bedtime.     cloNIDine 0.1 MG tablet  Commonly known as:  CATAPRES  Take 0.1 mg by mouth 2 (two) times daily.     furosemide 40 MG tablet  Commonly known as:  LASIX  Take 80 mg by mouth 2 (two) times daily.     glimepiride 1 MG tablet  Commonly known as:  AMARYL  Take 1 mg by mouth daily with breakfast.     lisinopril 20 MG tablet  Commonly known as:  PRINIVIL,ZESTRIL  Take 20 mg by mouth at bedtime.     sildenafil 100 MG tablet  Commonly known as:  VIAGRA  Take 100 mg by mouth daily as needed for erectile dysfunction.        Please HPI for pertinent positives,  otherwise complete 10 system ROS negative.  Physical Exam: BP 173/91  Pulse 67  Temp(Src) 98.2 F (36.8 C) (Oral)  Resp 18  Ht '5\' 8"'  (1.727 m)  Wt 230 lb (104.327 kg)  BMI 34.98 kg/m2  SpO2 93% Body mass index is 34.98 kg/(m^2).   General Appearance:  Alert, cooperative, no distress, appears stated age  Head:  Normocephalic, without obvious abnormality, atraumatic  ENT: Unremarkable  Neck: Supple, symmetrical, trachea midline  Lungs:   Clear to auscultation bilaterally, no w/r/r, respirations unlabored without use of accessory muscles.  Chest Wall:  No tenderness or deformity  Heart:  Regular rate and rhythm, S1, S2 normal, no murmur, rub or gallop.  Abdomen:   Soft, non-tender, non distended.  Neurologic: Normal affect, no gross deficits.   Results for orders placed during the hospital encounter of 12/05/13 (from the past 48 hour(s))  HEMOGLOBIN A1C     Status: Abnormal   Collection Time    12/05/13 10:57 PM      Result Value Ref Range   Hemoglobin A1C 6.0 (*) <5.7 %   Comment: (NOTE)  According to the ADA Clinical Practice Recommendations for 2011, when     HbA1c is used as a screening test:      >=6.5%   Diagnostic of Diabetes Mellitus               (if abnormal result is confirmed)     5.7-6.4%   Increased risk of developing Diabetes Mellitus     References:Diagnosis and Classification of Diabetes Mellitus,Diabetes     GNOI,3704,88(QBVQX 1):S62-S69 and Standards of Medical Care in             Diabetes - 2011,Diabetes IHWT,8882,80 (Suppl 1):S11-S61.   Mean Plasma Glucose 126 (*) <117 mg/dL   Comment: Performed at [place]     Status: None   Collection Time    12/05/13 10:57 PM      Result Value Ref Range   Magnesium 2.1  1.5 - 2.5 mg/dL  PHOSPHORUS     Status: Abnormal   Collection Time    12/05/13 10:57 PM      Result Value Ref Range   Phosphorus 5.7 (*) 2.3 - 4.6  mg/dL  TSH     Status: None   Collection Time    12/05/13 10:57 PM      Result Value Ref Range   TSH 1.830  0.350 - 4.500 uIU/mL   Comment: Performed at [place]     Status: Abnormal   Collection Time    12/05/13 10:57 PM      Result Value Ref Range   Pro B Natriuretic peptide (BNP) 1188.0 (*) 0 - 125 pg/mL  TROPONIN I     Status: None   Collection Time    12/05/13 10:57 PM      Result Value Ref Range   Troponin I <0.30  <0.30 ng/mL   Comment:            Due to the release kinetics of cTnI,     a negative result within the first hours     of the onset of symptoms does not rule out     myocardial infarction with certainty.     If myocardial infarction is still suspected,     repeat the test at appropriate intervals.  COMPREHENSIVE METABOLIC PANEL     Status: Abnormal   Collection Time    12/05/13 10:57 PM      Result Value Ref Range   Sodium 141  137 - 147 mEq/L   Potassium 4.0  3.7 - 5.3 mEq/L   Chloride 104  96 - 112 mEq/L   CO2 24  19 - 32 mEq/L   Glucose, Bld 96  70 - 99 mg/dL   BUN 44 (*) 6 - 23 mg/dL   Creatinine, Ser 1.42 (*) 0.50 - 1.35 mg/dL   Calcium 10.0  8.4 - 10.5 mg/dL   Total Protein 6.5  6.0 - 8.3 g/dL   Albumin 3.1 (*) 3.5 - 5.2 g/dL   AST 19  0 - 37 U/L   ALT 14  0 - 53 U/L   Alkaline Phosphatase 35 (*) 39 - 117 U/L   Total Bilirubin 0.5  0.3 - 1.2 mg/dL   GFR calc non Af Amer 55 (*) >90 mL/min   GFR calc Af Amer 64 (*) >90 mL/min   Comment: (NOTE)     The eGFR has been calculated using the CKD EPI equation.     This calculation has not been validated  in all clinical situations.     eGFR's persistently <90 mL/min signify possible Chronic Kidney     Disease.  CBC WITH DIFFERENTIAL     Status: Abnormal   Collection Time    12/05/13 10:57 PM      Result Value Ref Range   WBC 8.3  4.0 - 10.5 K/uL   RBC 3.67 (*) 4.22 - 5.81 MIL/uL   Hemoglobin 11.2 (*) 13.0 - 17.0 g/dL   HCT 33.4 (*) 39.0 - 52.0 %   MCV 91.0   78.0 - 100.0 fL   MCH 30.5  26.0 - 34.0 pg   MCHC 33.5  30.0 - 36.0 g/dL   RDW 13.5  11.5 - 15.5 %   Platelets 140 (*) 150 - 400 K/uL   Neutrophils Relative % 56  43 - 77 %   Neutro Abs 4.7  1.7 - 7.7 K/uL   Lymphocytes Relative 33  12 - 46 %   Lymphs Abs 2.7  0.7 - 4.0 K/uL   Monocytes Relative 8  3 - 12 %   Monocytes Absolute 0.6  0.1 - 1.0 K/uL   Eosinophils Relative 3  0 - 5 %   Eosinophils Absolute 0.3  0.0 - 0.7 K/uL   Basophils Relative 0  0 - 1 %   Basophils Absolute 0.0  0.0 - 0.1 K/uL  URINALYSIS, ROUTINE W REFLEX MICROSCOPIC     Status: Abnormal   Collection Time    12/05/13 11:48 PM      Result Value Ref Range   Color, Urine YELLOW  YELLOW   APPearance CLEAR  CLEAR   Specific Gravity, Urine 1.006  1.005 - 1.030   pH 6.5  5.0 - 8.0   Glucose, UA NEGATIVE  NEGATIVE mg/dL   Hgb urine dipstick SMALL (*) NEGATIVE   Bilirubin Urine NEGATIVE  NEGATIVE   Ketones, ur NEGATIVE  NEGATIVE mg/dL   Protein, ur 100 (*) NEGATIVE mg/dL   Urobilinogen, UA 0.2  0.0 - 1.0 mg/dL   Nitrite NEGATIVE  NEGATIVE   Leukocytes, UA NEGATIVE  NEGATIVE  URINE MICROSCOPIC-ADD ON     Status: None   Collection Time    12/05/13 11:48 PM      Result Value Ref Range   Squamous Epithelial / LPF RARE  RARE   WBC, UA 0-2  <3 WBC/hpf   RBC / HPF 7-10  <3 RBC/hpf   Bacteria, UA RARE  RARE  GLUCOSE, CAPILLARY     Status: None   Collection Time    12/05/13 11:56 PM      Result Value Ref Range   Glucose-Capillary 97  70 - 99 mg/dL   Comment 1 Notify RN    COMPREHENSIVE METABOLIC PANEL     Status: Abnormal   Collection Time    12/06/13  4:25 AM      Result Value Ref Range   Sodium 140  137 - 147 mEq/L   Potassium 3.9  3.7 - 5.3 mEq/L   Chloride 103  96 - 112 mEq/L   CO2 24  19 - 32 mEq/L   Glucose, Bld 101 (*) 70 - 99 mg/dL   BUN 42 (*) 6 - 23 mg/dL   Creatinine, Ser 1.32  0.50 - 1.35 mg/dL   Calcium 9.8  8.4 - 10.5 mg/dL   Total Protein 6.5  6.0 - 8.3 g/dL   Albumin 3.1 (*) 3.5 - 5.2 g/dL    AST 20  0 - 37 U/L   ALT  14  0 - 53 U/L   Alkaline Phosphatase 36 (*) 39 - 117 U/L   Total Bilirubin 0.5  0.3 - 1.2 mg/dL   GFR calc non Af Amer 61 (*) >90 mL/min   GFR calc Af Amer 70 (*) >90 mL/min   Comment: (NOTE)     The eGFR has been calculated using the CKD EPI equation.     This calculation has not been validated in all clinical situations.     eGFR's persistently <90 mL/min signify possible Chronic Kidney     Disease.  CBC     Status: Abnormal   Collection Time    12/06/13  4:25 AM      Result Value Ref Range   WBC 8.0  4.0 - 10.5 K/uL   RBC 3.68 (*) 4.22 - 5.81 MIL/uL   Hemoglobin 11.6 (*) 13.0 - 17.0 g/dL   HCT 33.2 (*) 39.0 - 52.0 %   MCV 90.2  78.0 - 100.0 fL   MCH 31.5  26.0 - 34.0 pg   MCHC 34.9  30.0 - 36.0 g/dL   RDW 13.5  11.5 - 15.5 %   Platelets 150  150 - 400 K/uL  PROTIME-INR     Status: None   Collection Time    12/06/13  4:25 AM      Result Value Ref Range   Prothrombin Time 13.2  11.6 - 15.2 seconds   INR 1.02  0.00 - 1.49  GLUCOSE, CAPILLARY     Status: None   Collection Time    12/06/13  4:40 AM      Result Value Ref Range   Glucose-Capillary 98  70 - 99 mg/dL   Comment 1 Notify RN    GLUCOSE, CAPILLARY     Status: Abnormal   Collection Time    12/06/13  8:44 AM      Result Value Ref Range   Glucose-Capillary 107 (*) 70 - 99 mg/dL   Comment 1 Notify RN     Comment 2 Documented in Chart     No results found.  Assessment/Plan Acute renal insufficiency, proteinuria Uncontrolled HTN, improving since admission Discussed procedure with pt and family. Explained risks and complications including bleeding, hematoma, hematuria, infection, risks of sedation. Consent obtained BP currently too high to perform bx safely, but if BP continues to improve (<140/90) we can move forward with procedure. Labs reviewed, ok  Ascencion Dike PA-C 12/06/2013, 10:50 AM

## 2013-12-07 ENCOUNTER — Inpatient Hospital Stay (HOSPITAL_COMMUNITY): Payer: 59

## 2013-12-07 DIAGNOSIS — I10 Essential (primary) hypertension: Secondary | ICD-10-CM

## 2013-12-07 LAB — BASIC METABOLIC PANEL
BUN: 46 mg/dL — ABNORMAL HIGH (ref 6–23)
CO2: 24 mEq/L (ref 19–32)
Calcium: 9.3 mg/dL (ref 8.4–10.5)
Chloride: 103 mEq/L (ref 96–112)
Creatinine, Ser: 1.46 mg/dL — ABNORMAL HIGH (ref 0.50–1.35)
GFR calc Af Amer: 62 mL/min — ABNORMAL LOW (ref >90)
GFR calc non Af Amer: 54 mL/min — ABNORMAL LOW (ref >90)
Glucose, Bld: 107 mg/dL — ABNORMAL HIGH (ref 70–99)
Potassium: 4.2 mEq/L (ref 3.7–5.3)
Sodium: 140 mEq/L (ref 137–147)

## 2013-12-07 LAB — GLUCOSE, CAPILLARY
Glucose-Capillary: 106 mg/dL — ABNORMAL HIGH (ref 70–99)
Glucose-Capillary: 115 mg/dL — ABNORMAL HIGH (ref 70–99)
Glucose-Capillary: 111 mg/dL — ABNORMAL HIGH (ref 70–99)
Glucose-Capillary: 101 mg/dL — ABNORMAL HIGH (ref 70–99)
Glucose-Capillary: 87 mg/dL (ref 70–99)
Glucose-Capillary: 252 mg/dL — ABNORMAL HIGH (ref 70–99)

## 2013-12-07 MED ORDER — SODIUM CHLORIDE 0.9 % IV SOLN
500.0000 mg | Freq: Every day | INTRAVENOUS | Status: AC
  Administered 2013-12-07 – 2013-12-09 (×3): 500 mg via INTRAVENOUS
  Filled 2013-12-07 (×3): qty 4

## 2013-12-07 MED ORDER — MIDAZOLAM HCL 2 MG/2ML IJ SOLN
INTRAMUSCULAR | Status: AC | PRN
  Administered 2013-12-07: 2 mg via INTRAVENOUS

## 2013-12-07 MED ORDER — FENTANYL CITRATE 0.05 MG/ML IJ SOLN
INTRAMUSCULAR | Status: AC | PRN
  Administered 2013-12-07: 100 ug via INTRAVENOUS

## 2013-12-07 MED ORDER — CLONIDINE HCL 0.2 MG PO TABS
0.2000 mg | ORAL_TABLET | Freq: Three times a day (TID) | ORAL | Status: DC
Start: 2013-12-07 — End: 2013-12-09
  Administered 2013-12-07 – 2013-12-09 (×7): 0.2 mg via ORAL
  Filled 2013-12-07 (×9): qty 1

## 2013-12-07 MED ORDER — FENTANYL CITRATE 0.05 MG/ML IJ SOLN
INTRAMUSCULAR | Status: AC
  Filled 2013-12-07: qty 2

## 2013-12-07 MED ORDER — MIDAZOLAM HCL 2 MG/2ML IJ SOLN
INTRAMUSCULAR | Status: AC
  Filled 2013-12-07: qty 2

## 2013-12-07 MED ORDER — ZOLPIDEM TARTRATE 5 MG PO TABS: 5.0000 mg | ORAL_TABLET | Freq: Every evening | ORAL | Status: DC | PRN

## 2013-12-07 NOTE — Progress Notes (Addendum)
Progress Note   RITA PROM LKG:[id number] DOB: Dec 16, 1960 DOA: 12/05/2013 PCP: Lujean Amel, MD   Brief Narrative:   Travis Bowman is an 53 y.o. male with a PMH of hypertension and hepatitis C who was directly admitted on 12/05/13 with a chief complaint of one week history of leg swelling, worsening hypertension and shortness of breath. Upon initial evaluation in the hospital, chest x-ray showed CHF. He was also noted to have blood and protein in his urine by his PCP, and was sent to nephrology for further evaluation, who arranged for his erectile admission for further evaluation and possible kidney biopsy.  Assessment/Plan:   Principal Problem: Hypertensive urgency, malignant / elevated BNP  Continue Norvasc, clonidine, Lasix. Increase clonidine to 0.2 mg 3 times a day. Lisinopril on hold per nephrology.  Continue as needed hydralazine.  2-D echocardiogram shows EF 50-55% with grade 1 diastolic dysfunction.  Active Problems: Hepatitis C  Treated with Harvoni 2 weeks ago.  DM (diabetes mellitus) type 2  Continue insulin sensitive sliding scale.  CBGs [id number].  Travis Bowman  Nephrology on board. Possible kidney biopsy today, if his blood pressure can be better controlled.  Albumin 3.1. 100 mg/dL protein on urinalysis, with small hemoglobin (7-10 RBCs per high-power field).  DVT Prophylaxis  SCDs ordered.  Code Status: Full. Family Communication: Wife at bedside. Disposition Plan: Home when stable.   IV Access:    Peripheral IV   Procedures:    2-D echocardiogram 12/06/13: EF 66-44%, grade 1 diastolic dysfunction.   Medical Consultants:    Dr. Fleet Contras, Nephrology  IR   Other Consultants:    None.   Anti-Infectives:    None.  Subjective:   Travis Bowman denies any current shortness of breath, and says his lower extremity swelling is much better. Feels well with no complaints.  Objective:    Filed Vitals:   12/06/13 1701 12/06/13 2030 12/07/13 0045 12/07/13 0534  BP: 172/90 170/88 143/82 152/93  Pulse: 72 74 69 68  Temp: 98.3 F (36.8 C) 98.5 F (36.9 C) 98.2 F (36.8 C) 98.5 F (36.9 C)  TempSrc: Oral Oral Oral Oral  Resp: 16 20 20 20   Height:      Weight:      SpO2: 96% 93% 95% 93%    Intake/Output Summary (Last 24 hours) at 12/07/13 1046 Last data filed at 12/07/13 0949  Gross per 24 hour  Intake    240 ml  Output    900 ml  Net   -660 ml    Exam: Gen:  NAD Cardiovascular:  RRR, No M/R/G Respiratory:  Lungs CTAB Gastrointestinal:  Abdomen soft, NT/ND, + BS Extremities:  1+ edema   Data Reviewed:   Labs: Basic Metabolic Panel:  Recent Labs Lab 12/05/13 2257 12/06/13 0425 12/07/13 0407  NA 141 140 140  K 4.0 3.9 4.2  CL 104 103 103  CO2 24 24 24   GLUCOSE 96 101* 107*  BUN 44* 42* 46*  CREATININE 1.42* 1.32 1.46*  CALCIUM 10.0 9.8 9.3  MG 2.1  --   --   PHOS 5.7*  --   --    GFR Estimated Creatinine Clearance: 69.3 ml/min (by C-G formula based on Cr of 1.46). Liver Function Tests:  Recent Labs Lab 12/05/13 2257 12/06/13 0425  AST 19 20  ALT 14 14  ALKPHOS 35* 36*  BILITOT 0.5 0.5  PROT 6.5 6.5  ALBUMIN 3.1* 3.1*   Coagulation profile  Recent Labs Lab 12/06/13  0425  INR 1.02    CBC:  Recent Labs Lab 12/05/13 2257 12/06/13 0425  WBC 8.3 8.0  NEUTROABS 4.7  --   HGB 11.2* 11.6*  HCT 33.4* 33.2*  MCV 91.0 90.2  PLT 140* 150   Cardiac Enzymes:  Recent Labs Lab 12/05/13 2257  TROPONINI <0.30   BNP (last 3 results)  Recent Labs  12/05/13 2257  PROBNP 1188.0*   CBG:  Recent Labs Lab 12/06/13 1640 12/06/13 2026 12/07/13 0025 12/07/13 0528 12/07/13 0802  GLUCAP 109* 128* 106* 115* 111*   Hgb A1c:  Recent Labs  12/05/13 2257  HGBA1C 6.0*   Thyroid function studies:  Recent Labs  12/05/13 2257  TSH 1.830   Microbiology No results found for this or any previous visit (from the past 240  hour(s)).   Radiographs/Studies:   Dg Chest 2 View  11/25/2013   CLINICAL DATA:  Five day history of bilateral lower extremity edema. Acute onset shortness of breath.  EXAM: CHEST  2 VIEW  COMPARISON:  Two-view chest x-ray 02/27/2005.  FINDINGS: Cardiac silhouette upper normal in size to slightly enlarged. Hilar and mediastinal contours otherwise unremarkable. Mild diffuse interstitial pulmonary edema. Small bilateral pleural effusions. No confluent airspace consolidation. Mild degenerative changes involving the thoracic spine.  IMPRESSION: Mild diffuse interstitial pulmonary edema and small bilateral pleural effusions consistent with mild CHF and/or fluid overload.  These results will be called to the ordering clinician or representative by the Radiologist Assistant, and communication documented in the PACS or zVision Dashboard.   Electronically Signed   By: Evangeline Dakin M.D.   On: 11/25/2013 15:51   Dg Wrist Complete Right  11/11/2013   CLINICAL DATA:  Right wrist effusion  EXAM: RIGHT WRIST - COMPLETE 3+ VIEW  COMPARISON:  None.  FINDINGS: Four views of the right wrist submitted. No acute fracture or subluxation. No radiopaque foreign body.  IMPRESSION: Negative.   Electronically Signed   By: Lahoma Crocker M.D.   On: 11/11/2013 13:58    Medications:   . amLODipine  10 mg Oral QHS  . cloNIDine  0.2 mg Oral TID  . furosemide  80 mg Oral BID  . insulin aspart  0-9 Units Subcutaneous 6 times per day  . sodium chloride  3 mL Intravenous Q12H   Continuous Infusions:   Time spent: 25 minutes    LOS: 2 days   Travis Bowman  [facility] Pager [phone]. If unable to reach me by pager, please call my cell phone at [phone].  *Please refer to [email or web address], password [email or web address] to get updated schedule on who will round on this patient, as hospitalists switch teams weekly. If 7PM-7AM, please contact night-coverage at [email or web address], password [email or web address] for any overnight needs.  12/07/2013, 10:46 AM     **Disclaimer: This note was dictated with voice recognition software. Similar sounding words can inadvertently be transcribed and this note may contain transcription errors which may not have been corrected upon publication of note.**   Information printed out and reviewed with the patient/family:     In an effort to keep you and your family informed about your hospital stay, I am providing you with this information sheet. If you or your family have any questions, please do not hesitate to have the nursing staff page me to set up a meeting time.  Also note that the hospitalist doctors typically change on Tuesdays or Wednesdays to a different hospitalist doctor.  Travis Bowman 12/07/2013 2 (Number of days in the hospital)  Treatment team:  Dr. Jacquelynn Cree, Hospitalist (Internist)  Dr. Fleet Contras, Kidney Specialist  Pertinent labs / studies:  Your 2-D echocardiogram showed that your heart is strong (ejection fraction 50-55% which is in the normal range), but slightly stiff.  Your blood sugars have ranged from 106-128.   Plan for today: You should be able to have your kidney biopsy today. Your most recent blood pressures have been good 143/82, 152/93. We have increased your clonidine to keep your blood pressure better controlled so the biopsy can be performed today.  Anticipated discharge date: Possibly tomorrow.

## 2013-12-07 NOTE — Progress Notes (Signed)
S: no new CO.  Edema better O:BP 152/93  Pulse 68  Temp(Src) 98.5 F (36.9 C) (Oral)  Resp 20  Ht 5\' 8"  (1.727 m)  Wt 104.327 kg (230 lb)  BMI 34.98 kg/m2  SpO2 93%  Intake/Output Summary (Last 24 hours) at 12/07/13 1218 Last data filed at 12/07/13 0949  Gross per 24 hour  Intake    240 ml  Output    900 ml  Net   -660 ml   Weight change:  TIR:WERXV and alert CVS:RRR Resp: few basilar crackles Abd:+ BS NTND Ext:1-2+ edema NEURO:CNI M&SI OX3 Skin  Follicular, erythematous rash on face   . amLODipine  10 mg Oral QHS  . cloNIDine  0.2 mg Oral TID  . furosemide  80 mg Oral BID  . insulin aspart  0-9 Units Subcutaneous 6 times per day  . sodium chloride  3 mL Intravenous Q12H   No results found. BMET    Component Value Date/Time   NA 140 12/07/2013 0407   K 4.2 12/07/2013 0407   CL 103 12/07/2013 0407   CO2 24 12/07/2013 0407   GLUCOSE 107* 12/07/2013 0407   BUN 46* 12/07/2013 0407   CREATININE 1.46* 12/07/2013 0407   CALCIUM 9.3 12/07/2013 0407   GFRNONAA 54* 12/07/2013 0407   GFRAA 62* 12/07/2013 0407   CBC    Component Value Date/Time   WBC 8.0 12/06/2013 0425   RBC 3.68* 12/06/2013 0425   HGB 11.6* 12/06/2013 0425   HCT 33.2* 12/06/2013 0425   PLT 150 12/06/2013 0425   MCV 90.2 12/06/2013 0425   MCH 31.5 12/06/2013 0425   MCHC 34.9 12/06/2013 0425   RDW 13.5 12/06/2013 0425   LYMPHSABS 2.7 12/05/2013 2257   MONOABS 0.6 12/05/2013 2257   EOSABS 0.3 12/05/2013 2257   BASOSABS 0.0 12/05/2013 2257     Assessment: 1. AKI sec nephrotic syndrome.  Pr/Cr [id number].  His ANA is + (titer pend) and AntiDs DNA + though complements were NL 2. HTN 3.  Hep C SP 12 weeks of Harvoni  Plan: 1.  Renal bx 2. Discussed + ANA and how this could mean Lupus but bx is definitely needed.  Will start empiric solumedrol as I think benefits outweigh risks 3. Daily Scr 4. Check anti histone antibodies    Rollie Hynek T

## 2013-12-07 NOTE — Procedures (Signed)
Interventional Radiology Procedure Note  Procedure: US guided bx LEFT kidney.  Three 16G cores obtained Complications: None Recommendations: - Bedrest x 4 hrs - Path pending - NPO except for liquids until 3 hrs, if doing well may ADAT  Signed,  Criselda Peaches, MD [facility]

## 2013-12-08 LAB — CBC
HCT: 35.1 % — ABNORMAL LOW (ref 39.0–52.0)
Hemoglobin: 12.3 g/dL — ABNORMAL LOW (ref 13.0–17.0)
MCH: 31.1 pg (ref 26.0–34.0)
MCHC: 35 g/dL (ref 30.0–36.0)
MCV: 88.6 fL (ref 78.0–100.0)
Platelets: 164 10*3/uL (ref 150–400)
RBC: 3.96 MIL/uL — ABNORMAL LOW (ref 4.22–5.81)
RDW: 13 % (ref 11.5–15.5)
WBC: 5.2 10*3/uL (ref 4.0–10.5)

## 2013-12-08 LAB — GLUCOSE, CAPILLARY
Glucose-Capillary: 162 mg/dL — ABNORMAL HIGH (ref 70–99)
Glucose-Capillary: 169 mg/dL — ABNORMAL HIGH (ref 70–99)
Glucose-Capillary: 183 mg/dL — ABNORMAL HIGH (ref 70–99)
Glucose-Capillary: 191 mg/dL — ABNORMAL HIGH (ref 70–99)
Glucose-Capillary: 253 mg/dL — ABNORMAL HIGH (ref 70–99)
Glucose-Capillary: 260 mg/dL — ABNORMAL HIGH (ref 70–99)

## 2013-12-08 LAB — RENAL FUNCTION PANEL
Albumin: 3.1 g/dL — ABNORMAL LOW (ref 3.5–5.2)
BUN: 58 mg/dL — ABNORMAL HIGH (ref 6–23)
CO2: 23 mEq/L (ref 19–32)
Calcium: 9.3 mg/dL (ref 8.4–10.5)
Chloride: 100 mEq/L (ref 96–112)
Creatinine, Ser: 1.58 mg/dL — ABNORMAL HIGH (ref 0.50–1.35)
GFR calc Af Amer: 56 mL/min — ABNORMAL LOW (ref >90)
GFR calc non Af Amer: 49 mL/min — ABNORMAL LOW (ref >90)
Glucose, Bld: 195 mg/dL — ABNORMAL HIGH (ref 70–99)
Phosphorus: 5.2 mg/dL — ABNORMAL HIGH (ref 2.3–4.6)
Potassium: 4.6 mEq/L (ref 3.7–5.3)
Sodium: 137 mEq/L (ref 137–147)

## 2013-12-08 LAB — HISTONE ANTIBODIES, IGG, BLOOD: DNA-Histone: <1

## 2013-12-08 MED ORDER — INSULIN ASPART 100 UNIT/ML ~~LOC~~ SOLN
0.0000 [IU] | Freq: Three times a day (TID) | SUBCUTANEOUS | Status: DC
  Administered 2013-12-09 (×2): 4 [IU] via SUBCUTANEOUS

## 2013-12-08 MED ORDER — INSULIN ASPART 100 UNIT/ML ~~LOC~~ SOLN
0.0000 [IU] | Freq: Every day | SUBCUTANEOUS | Status: DC
  Administered 2013-12-08: 3 [IU] via SUBCUTANEOUS

## 2013-12-08 NOTE — Progress Notes (Signed)
Progress Note   Travis Bowman DOB: March 12, 1961 DOA: 12/05/2013 PCP: Lujean Amel, MD   Brief Narrative:   Travis Bowman is an 53 y.o. male with a PMH of hypertension and hepatitis C who was directly admitted on 12/05/13 with a chief complaint of one week history of leg swelling, worsening hypertension and shortness of breath. Upon initial evaluation in the hospital, chest x-ray showed CHF. He was also noted to have blood and protein in his urine by his PCP, and was sent to nephrology for further evaluation, who arranged for his erectile admission for further evaluation and possible kidney biopsy.  Assessment/Plan:   Principal Problem: Hypertensive urgency, malignant / elevated BNP  Continue Norvasc, clonidine, Lasix. Increase clonidine to 0.2 mg 3 times a day. Lisinopril on hold per nephrology. Blood pressure better controlled over the past 24 hours.  Continue as needed hydralazine.  2-D echocardiogram shows EF 50-55% with grade 1 diastolic dysfunction.  Active Problems: Hepatitis C  Treated with Harvoni 2 weeks ago.  DM (diabetes mellitus) type 2  Continue insulin sensitive sliding scale.  CBGs 87-252.  Proteinuria/Hematuria  Status post renal biopsy 12/07/13, ANA positive, empiric Solu-Medrol started 12/07/13. Followup anti-histone antibodies.  Albumin 3.1. 100 mg/dL protein on urinalysis, with small hemoglobin (7-10 RBCs per high-power field).  Nephrology following.  DVT Prophylaxis  SCDs ordered.  Code Status: Full. Family Communication: Wife at bedside. Disposition Plan: Home when stable.   IV Access:    Peripheral IV   Procedures:    2-D echocardiogram 12/06/13: EF 81-19%, grade 1 diastolic dysfunction.  Renal biopsy 12/07/13.   Medical Consultants:    Dr. Fleet Contras, Nephrology  Dr. Jacqulynn Cadet, IR   Other Consultants:    None.   Anti-Infectives:    None.  Subjective:   Travis Bowman is without  complaints today. He is eager to go home tomorrow. Concerned about hyperglycemia from steroids.  Objective:    Filed Vitals:   12/07/13 1452 12/07/13 1454 12/07/13 1630 12/07/13 2100  BP: 154/95 147/93 141/80 131/86  Pulse: 58 55 53 60  Temp:   97.6 F (36.4 C) 97.6 F (36.4 C)  TempSrc:   Oral Oral  Resp: 14 16 16 16   Height:      Weight:      SpO2: 95% 95% 97% 97%    Intake/Output Summary (Last 24 hours) at 12/08/13 0806 Last data filed at 12/08/13 0032  Gross per 24 hour  Intake    989 ml  Output    250 ml  Net    739 ml    Exam: Gen:  NAD Cardiovascular:  RRR, No M/R/G Respiratory:  Lungs CTAB Gastrointestinal:  Abdomen soft, NT/ND, + BS Extremities:  1+ edema   Data Reviewed:   Labs: Basic Metabolic Panel:  Recent Labs Lab 12/05/13 2257 12/06/13 0425 12/07/13 0407 12/08/13 0327  NA 141 140 140 137  K 4.0 3.9 4.2 4.6  CL 104 103 103 100  CO2 24 24 24 23   GLUCOSE 96 101* 107* 195*  BUN 44* 42* 46* 58*  CREATININE 1.42* 1.32 1.46* 1.58*  CALCIUM 10.0 9.8 9.3 9.3  MG 2.1  --   --   --   PHOS 5.7*  --   --  5.2*   GFR Estimated Creatinine Clearance: 64.1 ml/min (by C-G formula based on Cr of 1.58). Liver Function Tests:  Recent Labs Lab 12/05/13 2257 12/06/13 0425 12/08/13 0327  AST 19 20  --  ALT 14 14  --   ALKPHOS 35* 36*  --   BILITOT 0.5 0.5  --   PROT 6.5 6.5  --   ALBUMIN 3.1* 3.1* 3.1*   Coagulation profile  Recent Labs Lab 12/06/13 0425  INR 1.02    CBC:  Recent Labs Lab 12/05/13 2257 12/06/13 0425 12/08/13 0327  WBC 8.3 8.0 5.2  NEUTROABS 4.7  --   --   HGB 11.2* 11.6* 12.3*  HCT 33.4* 33.2* 35.1*  MCV 91.0 90.2 88.6  PLT 140* 150 164   Cardiac Enzymes:  Recent Labs Lab 12/05/13 2257  TROPONINI <0.30   BNP (last 3 results)  Recent Labs  12/05/13 2257  PROBNP 1188.0*   CBG:  Recent Labs Lab 12/07/13 1637 12/07/13 2026 12/08/13 0008 12/08/13 0417 12/08/13 0730  GLUCAP 87 252* 183* 162* 169*     Hgb A1c:  Recent Labs  12/05/13 2257  HGBA1C 6.0*   Thyroid function studies:  Recent Labs  12/05/13 2257  TSH 1.830   Microbiology No results found for this or any previous visit (from the past 240 hour(s)).   Radiographs/Studies:   Dg Chest 2 View  11/25/2013   CLINICAL DATA:  Five day history of bilateral lower extremity edema. Acute onset shortness of breath.  EXAM: CHEST  2 VIEW  COMPARISON:  Two-view chest x-ray 02/27/2005.  FINDINGS: Cardiac silhouette upper normal in size to slightly enlarged. Hilar and mediastinal contours otherwise unremarkable. Mild diffuse interstitial pulmonary edema. Small bilateral pleural effusions. No confluent airspace consolidation. Mild degenerative changes involving the thoracic spine.  IMPRESSION: Mild diffuse interstitial pulmonary edema and small bilateral pleural effusions consistent with mild CHF and/or fluid overload.  These results will be called to the ordering clinician or representative by the Radiologist Assistant, and communication documented in the PACS or zVision Dashboard.   Electronically Signed   By: Evangeline Dakin M.D.   On: 11/25/2013 15:51   Dg Wrist Complete Right  11/11/2013   CLINICAL DATA:  Right wrist effusion  EXAM: RIGHT WRIST - COMPLETE 3+ VIEW  COMPARISON:  None.  FINDINGS: Four views of the right wrist submitted. No acute fracture or subluxation. No radiopaque foreign body.  IMPRESSION: Negative.   Electronically Signed   By: Lahoma Crocker M.D.   On: 11/11/2013 13:58    Medications:   . amLODipine  10 mg Oral QHS  . cloNIDine  0.2 mg Oral TID  . furosemide  80 mg Oral BID  . insulin aspart  0-9 Units Subcutaneous 6 times per day  . methylPREDNISolone (SOLU-MEDROL) injection  500 mg Intravenous Daily  . sodium chloride  3 mL Intravenous Q12H   Continuous Infusions:   Time spent: 25 minutes    LOS: 3 days   Neidra Girvan  [facility] Pager [phone]. If unable to reach me by pager, please call my  cell phone at [phone].  *Please refer to [email or web address], password [email or web address] to get updated schedule on who will round on this patient, as hospitalists switch teams weekly. If 7PM-7AM, please contact night-coverage at [email or web address], password [email or web address] for any overnight needs.  12/08/2013, 8:06 AM    **Disclaimer: This note was dictated with voice recognition software. Similar sounding words can inadvertently be transcribed and this note may contain transcription errors which may not have been corrected upon publication of note.**

## 2013-12-08 NOTE — Progress Notes (Signed)
S: Renal bx went well.  No gross hematuria O:BP 127/73  Pulse 56  Temp(Src) 97.4 F (36.3 C) (Oral)  Resp 16  Ht 5\' 8"  (1.727 m)  Wt 104.327 kg (230 lb)  BMI 34.98 kg/m2  SpO2 92%  Intake/Output Summary (Last 24 hours) at 12/08/13 0916 Last data filed at 12/08/13 0032  Gross per 24 hour  Intake    989 ml  Output    250 ml  Net    739 ml   Weight change:  DVV:OHYWV and alert CVS:RRR Resp: clear Abd:+ BS NTND Ext: 0-tr edema NEURO:CNI M&SI OX3    . amLODipine  10 mg Oral QHS  . cloNIDine  0.2 mg Oral TID  . furosemide  80 mg Oral BID  . insulin aspart  0-9 Units Subcutaneous 6 times per day  . methylPREDNISolone (SOLU-MEDROL) injection  500 mg Intravenous Daily  . sodium chloride  3 mL Intravenous Q12H   US Biopsy  12/07/2013   CLINICAL DATA:  53 year old male recently admitted with uncontrolled hypertension, acute renal failure and proteinuria. Renal biopsy is required to facilitate clinical diagnosis. His hypertension is relatively well controlled at this time with systolic pressures consistently less than 371 mmHg and diastolic pressure is less than 90 mmHg. We will proceed with ultrasound-guided renal biopsy.  EXAM: ULTRASOUND BIOPSY CORE RENAL  Date: 12/07/2013  PROCEDURE: 1. Ultrasound-guided core biopsy of the lower pole cortex of the left kidney Interventional Radiologist:  Criselda Peaches, MD  ANESTHESIA/SEDATION: Moderate (conscious) sedation was used. Two mg Versed, 100 mcg Fentanyl were administered intravenously. The patient's vital signs were monitored continuously by radiology nursing throughout the procedure.  Sedation Time: 20 minutes  TECHNIQUE: Informed consent was obtained from the patient following explanation of the procedure, risks, benefits and alternatives. The patient understands, agrees and consents for the procedure. All questions were addressed. A time out was performed.  The left flank was interrogated with ultrasound. The lower pole of the left  kidney was easily identified. A suitable skin entry site was selected and marked. The region was sterilely prepped and draped in the usual fashion using Betadine skin prep.  Local anesthesia was attained by infiltration with 1% lidocaine. A small dermatotomy was made. Under real-time ultrasound guidance, a total of three 16 gauge core biopsies were obtained using the Bard automated biopsy device. The biopsy specimens were evaluated by the pathology physician assistant and deemed to be adequate.  Post biopsy ultrasound imaging demonstrates no significant perinephric hematoma or evidence of active hemorrhage. The patient has tolerated the procedure very well.  COMPLICATIONS: None immediate  IMPRESSION: Technically successful ultrasound-guided core biopsy of the lower pole cortex of the left kidney.  Signed,  Criselda Peaches, MD  [facility]  [facility]   Electronically Signed   By: Jacqulynn Cadet M.D.   On: 12/07/2013 15:27   BMET    Component Value Date/Time   NA 137 12/08/2013 0327   K 4.6 12/08/2013 0327   CL 100 12/08/2013 0327   CO2 23 12/08/2013 0327   GLUCOSE 195* 12/08/2013 0327   BUN 58* 12/08/2013 0327   CREATININE 1.58* 12/08/2013 0327   CALCIUM 9.3 12/08/2013 0327   GFRNONAA 49* 12/08/2013 0327   GFRAA 56* 12/08/2013 0327   CBC    Component Value Date/Time   WBC 5.2 12/08/2013 0327   RBC 3.96* 12/08/2013 0327   HGB 12.3* 12/08/2013 0327   HCT 35.1* 12/08/2013 0327   PLT 164 12/08/2013  0327   MCV 88.6 12/08/2013 0327   MCH 31.1 12/08/2013 0327   MCHC 35.0 12/08/2013 0327   RDW 13.0 12/08/2013 0327   LYMPHSABS 2.7 12/05/2013 2257   MONOABS 0.6 12/05/2013 2257   EOSABS 0.3 12/05/2013 2257   BASOSABS 0.0 12/05/2013 2257     Assessment: 1. AKI sec nephrotic syndrome.  Pr/Cr [id number].  His ANA is + (titer pend) and AntiDs DNA + though complements were NL 2. HTN, BP under better control 3.  Hep C SP 12 weeks of Harvoni  Plan: 1.  One more day of IV  solumedrol and if renal fx stable tomorrow, he can be DC'd.  Will plan to DC on prednisone 60mg  q d. 2.  I would resume his oral hypoglycemic med and his BS will certainly increase but at a higher dose.  His PCP should be informed of the need for closer FU of BS. 3.  Cont current BP meds  Lauree Yurick T

## 2013-12-09 LAB — BASIC METABOLIC PANEL
BUN: 65 mg/dL — ABNORMAL HIGH (ref 6–23)
CO2: 21 mEq/L (ref 19–32)
Calcium: 8.9 mg/dL (ref 8.4–10.5)
Chloride: 102 mEq/L (ref 96–112)
Creatinine, Ser: 1.33 mg/dL (ref 0.50–1.35)
GFR calc Af Amer: 70 mL/min — ABNORMAL LOW (ref >90)
GFR calc non Af Amer: 60 mL/min — ABNORMAL LOW (ref >90)
Glucose, Bld: 203 mg/dL — ABNORMAL HIGH (ref 70–99)
Potassium: 4.3 mEq/L (ref 3.7–5.3)
Sodium: 137 mEq/L (ref 137–147)

## 2013-12-09 LAB — GLUCOSE, CAPILLARY
Glucose-Capillary: 153 mg/dL — ABNORMAL HIGH (ref 70–99)
Glucose-Capillary: 186 mg/dL — ABNORMAL HIGH (ref 70–99)

## 2013-12-09 MED ORDER — INSULIN ASPART 100 UNIT/ML FLEXPEN: PEN_INJECTOR | SUBCUTANEOUS | Status: DC

## 2013-12-09 MED ORDER — AMLODIPINE BESYLATE 10 MG PO TABS: 10.0000 mg | ORAL_TABLET | Freq: Every day | ORAL | Status: DC

## 2013-12-09 MED ORDER — FUROSEMIDE 80 MG PO TABS: 80.0000 mg | ORAL_TABLET | Freq: Two times a day (BID) | ORAL | Status: DC

## 2013-12-09 MED ORDER — PREDNISONE 10 MG PO TABS: 60.0000 mg | ORAL_TABLET | Freq: Every day | ORAL | Status: DC

## 2013-12-09 MED ORDER — CLONIDINE HCL 0.2 MG PO TABS: 0.2000 mg | ORAL_TABLET | Freq: Three times a day (TID) | ORAL | Status: DC

## 2013-12-09 NOTE — Discharge Instructions (Signed)
Hypertension As your heart beats, it forces blood through your arteries. This force is your blood pressure. If the pressure is too high, it is called hypertension (HTN) or high blood pressure. HTN is dangerous because you may have it and not know it. High blood pressure may mean that your heart has to work harder to pump blood. Your arteries may be narrow or stiff. The extra work puts you at risk for heart disease, stroke, and other problems.  Blood pressure consists of two numbers, a higher number over a lower, 110/72, for example. It is stated as "110 over 72." The ideal is below 120 for the top number (systolic) and under 80 for the bottom (diastolic). Write down your blood pressure today. You should pay close attention to your blood pressure if you have certain conditions such as:  Heart failure.  Prior heart attack.  Diabetes  Chronic kidney disease.  Prior stroke.  Multiple risk factors for heart disease. To see if you have HTN, your blood pressure should be measured while you are seated with your arm held at the level of the heart. It should be measured at least twice. A one-time elevated blood pressure reading (especially in the Emergency Department) does not mean that you need treatment. There may be conditions in which the blood pressure is different between your right and left arms. It is important to see your caregiver soon for a recheck. Most people have essential hypertension which means that there is not a specific cause. This type of high blood pressure may be lowered by changing lifestyle factors such as:  Stress.  Smoking.  Lack of exercise.  Excessive weight.  Drug/tobacco/alcohol use.  Eating less salt. Most people do not have symptoms from high blood pressure until it has caused damage to the body. Effective treatment can often prevent, delay or reduce that damage. TREATMENT  When a cause has been identified, treatment for high blood pressure is directed at the  cause. There are a large number of medications to treat HTN. These fall into several categories, and your caregiver will help you select the medicines that are best for you. Medications may have side effects. You should review side effects with your caregiver. If your blood pressure stays high after you have made lifestyle changes or started on medicines,   Your medication(s) may need to be changed.  Other problems may need to be addressed.  Be certain you understand your prescriptions, and know how and when to take your medicine.  Be sure to follow up with your caregiver within the time frame advised (usually within two weeks) to have your blood pressure rechecked and to review your medications.  If you are taking more than one medicine to lower your blood pressure, make sure you know how and at what times they should be taken. Taking two medicines at the same time can result in blood pressure that is too low. SEEK IMMEDIATE MEDICAL CARE IF:  You develop a severe headache, blurred or changing vision, or confusion.  You have unusual weakness or numbness, or a faint feeling.  You have severe chest or abdominal pain, vomiting, or breathing problems. MAKE SURE YOU:   Understand these instructions.  Will watch your condition.  Will get help right away if you are not doing well or get worse. Document Released: 06/16/2005 Document Revised: 09/08/2011 Document Reviewed: 02/04/2008 [facility] Patient Information 2014 [place]. Proteinuria Proteinuria is a condition in which urine contains more protein than is normal. Proteinuria is  either a sign that your body is producing too much protein or a sign that there is a problem with the kidneys. Healthy kidneys prevent most substances that the body needs, including proteins, from leaving the bloodstream and ending up in urine. CAUSES  Proteinuria may be caused by a temporary event or condition such as stress, exercise, or fever, and go away on  its own. Proteinuria may also be a symptom of a more serious condition or disease. Causes of proteinuria include:  A kidney disease caused by:  Diabetes.  High blood pressure (hypertension).   A disease that affects the immune system, such as lupus.  A genetic disease, such as Alport's syndrome.  Medicines that damage the kidneys, such as long-term nonsteroidal anti-inflammatory drugs (NSAIDs).  Poisoning or exposure to toxic substances.  A reoccurring kidney or urinary infection.  Excess protein production in the body caused by:  Multiple myeloma.  Amyloidosis. SYMPTOMS You may have proteinuria without having noticeable symptoms. If there is a large amount of protein in your urine, your urine may look foamy. You may also notice swelling (edema) in your hands, feet, abdomen, or face. DIAGNOSIS To determine whether you have proteinuria, you will need to provide a urine sample. Your urine will then be tested for too much protein and the main blood protein albumin. If your test shows that you have proteinuria, you may need to take additional tests to determine its cause, how much protein is in your urine, and what type of protein is being lost. Tests may include:  Blood tests.  Urine tests.  A blood pressure measurement.  Imaging tests. TREATMENT  Treatment will depend on the cause of your proteinuria. Your caregiver will discuss treatment options with you after you have been diagnosed. If your proteinuria is mild or temporary, no treatment may be necessary. HOME CARE INSTRUCTIONS Ask your caregiver if monitoring the level of protein in your urine at home using simple testing strips is appropriate for you. Early detection of proteinuria can lead to early and often successful treatment of the condition causing it. Document Released: 08/06/2005 Document Revised: 03/10/2012 Document Reviewed: 11/14/2011 [facility] Patient Information 2014 [place]. Blood Glucose Monitoring,  Adult Monitoring your blood glucose (also know as blood sugar) helps you to manage your diabetes. It also helps you and your health care provider monitor your diabetes and determine how well your treatment plan is working. WHY SHOULD YOU MONITOR YOUR BLOOD GLUCOSE?  It can help you understand how food, exercise, and medicine affect your blood glucose.  It allows you to know what your blood glucose is at any given moment. You can quickly tell if you are having low blood glucose (hypoglycemia) or high blood glucose (hyperglycemia).  It can help you and your health care provider know how to adjust your medicines.  It can help you understand how to manage an illness or adjust medicine for exercise. WHEN SHOULD YOU TEST? Your health care provider will help you decide how often you should check your blood glucose. This may depend on the type of diabetes you have, your diabetes control, or the types of medicines you are taking. Be sure to write down all of your blood glucose readings so that this information can be reviewed with your health care provider. See below for examples of testing times that your health care provider may suggest. Type 1 Diabetes  Test 4 times a day if you are in good control, using an insulin pump, or perform multiple daily injections.  If your diabetes is not well-controlled or if you are sick, you may need to monitor more often.  It is a good idea to also monitor:  Before and after exercise.  Between meals and 2 hours after a meal.  Occasionally between 2:00 to 3:00 am. Type 2 Diabetes  It can vary with each person, but generally, if you are on insulin, test 4 times a day.  If you take medicines by mouth (orally), test 2 times a day.  If you are on a controlled diet, test once a day.  If your diabetes is not well controlled or if you are sick, you may need to monitor more often. HOW TO MONITOR YOUR BLOOD GLUCOSE Supplies Needed  Blood glucose meter.  Test  strips for your meter. Each meter has its own strips. You must use the strips that go with your own meter.  A pricking needle (lancet).  A device that holds the lancet (lancing device).  A journal or log book to write down your results. Procedure  Wash your hands with soap and water. Alcohol is not preferred.  Prick the side of your finger (not the tip) with the lancet.  Gently milk the finger until a small drop of blood appears.  Follow the instructions that come with your meter for inserting the test strip, applying blood to the strip, and using your blood glucose meter. Other Areas to Get Blood for Testing Some meters allow you to use other areas of your body (other than your finger) to test your blood. These areas are called alternative sites. The most common alternative sites are:  The forearm.  The thigh.  The back area of the lower leg.  The palm of the hand. The blood flow in these areas is slower. Therefore, the blood glucose values you get may be delayed, and the numbers are different from what you would get from your fingers. Do not use alternative sites if you think you are having hypoglycemia. Your reading will not be accurate. Always use a finger if you are having hypoglycemia. Also, if you cannot feel your lows (hypoglycemia unawareness), always use your fingers for your blood glucose checks. ADDITIONAL TIPS FOR GLUCOSE MONITORING  Do not reuse lancets.  Always carry your supplies with you.  All blood glucose meters have a 24-hour "hotline" number to call if you have questions or need help.  Adjust (calibrate) your blood glucose meter with a control solution after finishing a few boxes of strips. BLOOD GLUCOSE RECORD KEEPING It is a good idea to keep a daily record or log of your blood glucose readings. Most glucose meters, if not all, keep your glucose records stored in the meter. Some meters come with the ability to download your records to your home computer.  Keeping a record of your blood glucose readings is especially helpful if you are wanting to look for patterns. Make notes to go along with the blood glucose readings because you might forget what happened at that exact time. Keeping good records helps you and your health care provider to work together to achieve good diabetes management.  Document Released: 06/19/2003 Document Revised: 02/16/2013 Document Reviewed: 11/08/2012 [facility] Patient Information 2014 [place].

## 2013-12-09 NOTE — Progress Notes (Signed)
12/09/13 1200  Reviewed discharge instructions with patient and his wife. Both verbalized understanding of discharge instructions. Copy of discharge papers and prescriptions given to patient.

## 2013-12-09 NOTE — Discharge Summary (Addendum)
Physician Discharge Summary  Travis Bowman QIO:[id number] DOB: [birth year] DOA: 12/05/2013  PCP: Lujean Amel, MD  Admit date: 12/05/2013 Discharge date: 12/09/2013  Recommendations for Outpatient Follow-up:  Use sliding scale as prescribed:    CBG 70 - 120: 0 units      CBG > 400 call MD and obtain STAT lab verification      CBG 121 - 150: 3 units      CBG 151 - 200: 4 units      CBG 201 - 250: 7 units      CBG 251 - 300: 11 units      CBG 301 - 350: 15 units      CBG 351 - 400: 20 units   Before a meal (preprandial plasma glucose): 70-130   1-2 hours after beginning of the meal (Postprandial plasma glucose): Less than 180    See more at: www.diabetes.org/living-with-diabetes/treatment-and-care/blood-glucose-control/checking-your-blood-glucose.htm  Per renal MD recommendations please continue prednisone 60 mg daily. Please follow with renal to make sure symptoms are stable and for them to instruct you on how to taper steroids. Do not stop taking prednisone on your own. If your are taking steroids for longer than 2 week period you need to have slow taper down to 0 mg but we will leave it up to your renal doctor to decide when this is appropriate.  Discharge Diagnoses:  Principal Problem:   Hypertensive urgency, malignant Active Problems:   Hepatitis C   DM (diabetes mellitus)   Proteinuria   Hematuria   Elevated brain natriuretic peptide (BNP) level   Discharge Condition: stable   Diet recommendation: as tolerated   History of present illness:  53 y.o. male with a PMH of hypertension and hepatitis C who was directly admitted on 12/05/13 with a chief complaint of one week history of leg swelling, worsening hypertension and shortness of breath. Upon initial evaluation in the hospital, chest x-ray showed CHF. He was also noted to have blood and protein in his urine by his PCP, and was sent to nephrology for further evaluation, who arranged for his erectile admission for further  evaluation and possible kidney biopsy.   Assessment/Plan:    Principal Problem:  Hypertensive urgency, malignant / elevated BNP  Continue Norvasc, clonidine, Lasix, clonidine which was increased to 0.2 mg 3 times a day. Lisinopril on hold per nephrology. Blood pressure 140/79. 2-D echocardiogram shows EF 50-55% with grade 1 diastolic dysfunction. Active Problems:  Hepatitis C  Treated with Harvoni 2 weeks ago. DM (diabetes mellitus) type 2  Continue insulin sensitive sliding scale. Continue Amaryl. Proteinuria/Hematuria  Status post renal biopsy 12/07/13, ANA positive, empiric Solu-Medrol started 12/07/13. Will be discharged on prednisone 60 mg daily per renal recommendations.  Followup anti-histone antibodies - negative Albumin 3.1. 100 mg/dL protein on urinalysis, with small hemoglobin (7-10 RBCs per high-power field).  DVT Prophylaxis  SCDs ordered.    IV Access:   Peripheral IV Procedures:   2-D echocardiogram 12/06/13: EF 32-44%, grade 1 diastolic dysfunction.  Renal biopsy 12/07/13. Medical Consultants:   Dr. Fleet Contras, Nephrology  Dr. Jacqulynn Cadet, IR   Signed:  Leisa Lenz, MD  [facility] 12/09/2013, 12:05 PM  Pager #: [phone]   Discharge Exam: Filed Vitals:   12/09/13 0934  BP: 140/79  Pulse: 71  Temp:   Resp:    Filed Vitals:   12/08/13 1457 12/08/13 2149 12/09/13 0506 12/09/13 0934  BP: 141/89 139/83 127/75 140/79  Pulse: 101 71 64 71  Temp: 97.5 F (36.4  C) 97.5 F (36.4 C) 97.8 F (36.6 C)   TempSrc: Oral Oral Oral   Resp: 17 16 16    Height:      Weight:      SpO2: 96% 97% 97%     General: Pt is alert, follows commands appropriately, not in acute distress Cardiovascular: Regular rate and rhythm, S1/S2 +, no murmurs Respiratory: Clear to auscultation bilaterally, no wheezing, no crackles, no rhonchi Abdominal: Soft, non tender, non distended, bowel sounds +, no guarding Extremities: +1 edema, no cyanosis, pulses  palpable bilaterally DP and PT Neuro: Grossly nonfocal  Discharge Instructions  Discharge Instructions   Call MD for:  difficulty breathing, headache or visual disturbances    Complete by:  As directed      Call MD for:  persistant dizziness or light-headedness    Complete by:  As directed      Call MD for:  persistant nausea and vomiting    Complete by:  As directed      Call MD for:  severe uncontrolled pain    Complete by:  As directed      Diet - low sodium heart healthy    Complete by:  As directed      Discharge instructions    Complete by:  As directed   Use sliding scale as prescribed:    CBG 70 - 120: 0 units      CBG > 400 call MD and obtain STAT lab verification      CBG 121 - 150: 3 units      CBG 151 - 200: 4 units      CBG 201 - 250: 7 units      CBG 251 - 300: 11 units      CBG 301 - 350: 15 units      CBG 351 - 400: 20 units   Before a meal (preprandial plasma glucose): 70-130   1-2 hours after beginning of the meal (Postprandial plasma glucose): Less than 180    See more at: www.diabetes.org/living-with-diabetes/treatment-and-care/blood-glucose-control/checking-your-blood-glucose.htm  Per renal MD recommendations please continue prednisone 60 mg daily. Please follow with renal to make sure symptoms are stable and for them to instruct you on how to taper steroids. Do not stop taking prednisone on your own. If your are taking steroids for longer than 2 week period you need to have slow taper down to 0 mg but we will leave it up to your renal doctor to decide when this is appropriate.     Increase activity slowly    Complete by:  As directed             Medication List    STOP taking these medications       lisinopril 20 MG tablet  Commonly known as:  PRINIVIL,ZESTRIL      TAKE these medications       acetaminophen 500 MG tablet  Commonly known as:  TYLENOL  Take 500-1,000 mg by mouth every 6 (six) hours as needed for headache.     amLODipine 10 MG  tablet  Commonly known as:  NORVASC  Take 1 tablet (10 mg total) by mouth at bedtime.     cloNIDine 0.2 MG tablet  Commonly known as:  CATAPRES  Take 1 tablet (0.2 mg total) by mouth 3 (three) times daily.     furosemide 80 MG tablet  Commonly known as:  LASIX  Take 1 tablet (80 mg total) by mouth 2 (two) times daily.  glimepiride 1 MG tablet  Commonly known as:  AMARYL  Take 1 mg by mouth daily with breakfast.     insulin aspart 100 UNIT/ML FlexPen  Commonly known as:  NOVOLOG FLEXPEN  - Use sliding scale as prescribed:  -   -   CBG 70 - 120: 0 units     -   CBG > 400 call MD and obtain STAT lab verification     -   CBG 121 - 150: 3 units     -   CBG 151 - 200: 4 units     -   CBG 201 - 250: 7 units     -   CBG 251 - 300: 11 units     -   CBG 301 - 350: 15 units     -   CBG 351 - 400: 20 units     predniSONE 10 MG tablet  Commonly known as:  DELTASONE  Take 6 tablets (60 mg total) by mouth daily with breakfast.     sildenafil 100 MG tablet  Commonly known as:  VIAGRA  Take 100 mg by mouth daily as needed for erectile dysfunction.           Follow-up Information   Follow up with MATTINGLY,MICHAEL T, MD. Schedule an appointment as soon as possible for a visit in 2 weeks. (follow up on prednisone dosing and when tapering required)    Specialty:  Nephrology   Contact information:   [place] [address] [phone]       Follow up with Lujean Amel, MD. Schedule an appointment as soon as possible for a visit in 2 weeks.   Specialty:  Family Medicine   Contact information:   [address] [place] [address] [phone]        The results of significant diagnostics from this hospitalization (including imaging, microbiology, ancillary and laboratory) are listed below for reference.    Significant Diagnostic Studies: Dg Chest 2 View 11/25/2013    IMPRESSION: Mild diffuse interstitial pulmonary edema and small  bilateral pleural effusions consistent with mild CHF and/or fluid overload.  These results will be called to the ordering clinician or representative by the Radiologist Assistant, and communication documented in the PACS or zVision Dashboard.   Electronically Signed   By: Evangeline Dakin M.D.   On: 11/25/2013 15:51   Dg Wrist Complete Right 11/11/2013   IMPRESSION: Negative.   Electronically Signed   By: Lahoma Crocker M.D.   On: 11/11/2013 13:58   US Biopsy 12/07/2013    IMPRESSION: Technically successful ultrasound-guided core biopsy of the lower pole cortex of the left kidney.  Signed,  Criselda Peaches, MD  [facility]  [facility]   Electronically Signed   By: Jacqulynn Cadet M.D.   On: 12/07/2013 15:27    Microbiology: No results found for this or any previous visit (from the past 240 hour(s)).   Labs: Basic Metabolic Panel:  Recent Labs Lab 12/05/13 2257 12/06/13 0425 12/07/13 0407 12/08/13 0327 12/09/13 0320  NA 141 140 140 137 137  K 4.0 3.9 4.2 4.6 4.3  CL 104 103 103 100 102  CO2 24 24 24 23 21   GLUCOSE 96 101* 107* 195* 203*  BUN 44* 42* 46* 58* 65*  CREATININE 1.42* 1.32 1.46* 1.58* 1.33  CALCIUM 10.0 9.8 9.3 9.3 8.9  MG 2.1  --   --   --   --  PHOS 5.7*  --   --  5.2*  --    Liver Function Tests:  Recent Labs Lab 12/05/13 2257 12/06/13 0425 12/08/13 0327  AST 19 20  --   ALT 14 14  --   ALKPHOS 35* 36*  --   BILITOT 0.5 0.5  --   PROT 6.5 6.5  --   ALBUMIN 3.1* 3.1* 3.1*   No results found for this basename: LIPASE, AMYLASE,  in the last 168 hours No results found for this basename: AMMONIA,  in the last 168 hours CBC:  Recent Labs Lab 12/05/13 2257 12/06/13 0425 12/08/13 0327  WBC 8.3 8.0 5.2  NEUTROABS 4.7  --   --   HGB 11.2* 11.6* 12.3*  HCT 33.4* 33.2* 35.1*  MCV 91.0 90.2 88.6  PLT 140* 150 164   Cardiac Enzymes:  Recent Labs Lab 12/05/13 2257  TROPONINI <0.30   BNP: BNP (last 3  results)  Recent Labs  12/05/13 2257  PROBNP 1188.0*   CBG:  Recent Labs Lab 12/08/13 0730 12/08/13 1234 12/08/13 1639 12/08/13 2152 12/09/13 0722  GLUCAP 169* 191* 260* 253* 153*    Time coordinating discharge: Over 30 minutes

## 2013-12-14 ENCOUNTER — Other Ambulatory Visit: Payer: Self-pay | Admitting: Gastroenterology

## 2013-12-14 DIAGNOSIS — B1920 Unspecified viral hepatitis C without hepatic coma: Secondary | ICD-10-CM

## 2013-12-28 ENCOUNTER — Other Ambulatory Visit: Payer: 59

## 2014-02-24 ENCOUNTER — Encounter (HOSPITAL_COMMUNITY): Payer: Self-pay

## 2014-06-19 ENCOUNTER — Ambulatory Visit
Admission: RE | Admit: 2014-06-19 | Discharge: 2014-06-19 | Disposition: A | Discharge status: Home / Self Care | Payer: 59 | Source: Ambulatory Visit | Attending: Family Medicine | Admitting: Family Medicine

## 2014-06-19 ENCOUNTER — Other Ambulatory Visit: Payer: Self-pay | Admitting: Family Medicine

## 2014-06-19 DIAGNOSIS — M25562 Pain in left knee: Principal | ICD-10-CM

## 2014-06-19 DIAGNOSIS — M25561 Pain in right knee: Secondary | ICD-10-CM

## 2014-07-04 ENCOUNTER — Other Ambulatory Visit: Payer: Self-pay | Admitting: Gastroenterology

## 2014-07-04 DIAGNOSIS — B182 Chronic viral hepatitis C: Secondary | ICD-10-CM

## 2014-07-10 ENCOUNTER — Ambulatory Visit
Admission: RE | Admit: 2014-07-10 | Discharge: 2014-07-10 | Disposition: A | Discharge status: Home / Self Care | Payer: 59 | Source: Ambulatory Visit | Attending: Gastroenterology | Admitting: Gastroenterology

## 2014-07-10 DIAGNOSIS — B182 Chronic viral hepatitis C: Secondary | ICD-10-CM

## 2014-08-30 ENCOUNTER — Telehealth (HOSPITAL_COMMUNITY): Payer: Self-pay | Admitting: Internal Medicine

## 2014-08-30 NOTE — Telephone Encounter (Signed)
Received records from [facility] for appointment on 09/01/14 with Dr Debara Pickett.  Records given to [facility] (medical records) for Dr Lysbeth Penner schedule on 09/01/14.  lp

## 2014-09-01 ENCOUNTER — Ambulatory Visit (INDEPENDENT_AMBULATORY_CARE_PROVIDER_SITE_OTHER): Payer: 59 | Admitting: Internal Medicine

## 2014-09-01 ENCOUNTER — Encounter: Payer: Self-pay | Admitting: Internal Medicine

## 2014-09-01 VITALS — BP 138/80 | HR 65 | Ht 68.0 in | Wt 231.7 lb

## 2014-09-01 DIAGNOSIS — R0789 Other chest pain: Secondary | ICD-10-CM

## 2014-09-01 DIAGNOSIS — I10 Essential (primary) hypertension: Secondary | ICD-10-CM

## 2014-09-01 DIAGNOSIS — K219 Gastro-esophageal reflux disease without esophagitis: Secondary | ICD-10-CM

## 2014-09-01 NOTE — Progress Notes (Signed)
OFFICE NOTE  Chief Complaint:  Epigastric pain  Primary Care Physician: Lujean Amel, MD  HPI:  Travis Bowman is a pleasant 54 year old male who presents for evaluation of what sounds like epigastric pain. He's had some episodes of discomfort and fullness in his abdomen which is improved with belching. Denies any pain with exertion in his chest or worsening shortness of breath. 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 Fortunately he developed recovery of his renal function. He also had some significant hypertension associated with that which is improved. He does have a history of very mild diabetes on oral agents. There is no significant family history of coronary disease. He previously had reflux symptoms and although now is not having any acid taste in his mouth does feel a fullness in the stomach again and does report belching and some improvement in his symptoms.  PMHx:  Past Medical History  Diagnosis Date  . Hypertension   . Hep C w/o coma, chronic   . ED (erectile dysfunction)   . Type 2 diabetes mellitus   . HCV infection   . History of nephrolithiasis   . MPGN (membranoproliferative glomerulonephritides)     Past Surgical History  Procedure Laterality Date  . Eye surgery    . Kidney stone surgery    . Testicle removed      age 89  . Tonsillectomy      adenoids removed also    FAMHx:  Family History  Problem Relation Age of Onset  . Stroke Mother   . CAD Mother   . Alcohol abuse Father   . Diabetes Brother   . Marfan syndrome Brother     SOCHx:   reports that he quit smoking about 9 years ago. He has never used smokeless tobacco. He reports that he drinks alcohol. He reports that he does not use illicit drugs.  ALLERGIES:  Allergies  Allergen Reactions  . Morphine And Related Nausea And Vomiting    ROS: A comprehensive review  of systems was negative except for: Cardiovascular: positive for chest pressure/discomfort Gastrointestinal: positive for reflux symptoms  HOME MEDS: Current Outpatient Prescriptions  Medication Sig Dispense Refill  . acetaminophen (TYLENOL) 500 MG tablet Take 500-1,000 mg by mouth every 6 (six) hours as needed for headache.    [name] aspirin 81 MG tablet Take 81 mg by mouth daily.    . chlorthalidone (HYGROTON) 25 MG tablet Take 1 tablet by mouth daily.  12  . glimepiride (AMARYL) 1 MG tablet Take 1 tablet by mouth daily.  5  . lisinopril (PRINIVIL,ZESTRIL) 40 MG tablet Take 1 tablet by mouth daily.  3  . meloxicam (MOBIC) 15 MG tablet Take 1 tablet by mouth daily.  2  . VIAGRA 100 MG tablet as needed.  5   No current facility-administered medications for this visit.    LABS/IMAGING: No results found for this or any previous visit (from the past 48 hour(s)). No results found.  VITALS: BP 138/80 mmHg  Pulse 65  Ht 5\' 8"  (1.727 m)  Wt 231 lb 11.2 oz (105.098 kg)  BMI 35.24 kg/m2  EXAM: General appearance: alert and no distress Neck: no carotid bruit and no JVD Lungs: clear to auscultation bilaterally Heart: regular rate and rhythm, S1, S2 normal, no murmur, click, rub or gallop Abdomen: soft, non-tender; bowel sounds normal; no masses,  no organomegaly Extremities: extremities normal, atraumatic, no cyanosis  or edema Pulses: 2+ and symmetric Skin: Skin color, texture, turgor normal. No rashes or lesions Neurologic: Grossly normal Psych: Pleasant  EKG: Sinus rhythm at 65, no ischemic changes  ASSESSMENT: 1. Atypical chest pain, likely GERD 2. Hypertension-controlled 3. Diabetes type 2  PLAN: 1.   Mr. Landess has what sounds like atypical chest discomfort which is actually most the midepigastric and associated with belching. I suspect this is reflux. He has been seen by GI in the past in plans to make an appointment to see them in the near future. His EKG is unremarkable. His  diabetes is minimal and well controlled. Blood pressure is also at goal. Renal function is recovered and his hepatitis C is now cured. Overall I think he is doing pretty well. I've low suspicion that this is coronary disease. I think he should continue to pursue a GI workup, however this is unremarkable her symptoms worsen, I'm happy to do further ischemic testing.  He can contact me with follow-up as needed. Thanks for the kind referral.  Pixie Casino, MD, [facility] Attending Cardiologist [place] 09/01/2014, 12:29 PM

## 2014-09-01 NOTE — Patient Instructions (Signed)
Your physician recommends that you schedule a follow-up appointment as needed  

## 2015-02-02 ENCOUNTER — Other Ambulatory Visit: Payer: Self-pay | Admitting: Orthopedic Surgery

## 2015-02-02 ENCOUNTER — Encounter (HOSPITAL_BASED_OUTPATIENT_CLINIC_OR_DEPARTMENT_OTHER): Payer: Self-pay | Admitting: *Deleted

## 2015-02-02 NOTE — Progress Notes (Signed)
NPO AFTER MN WITH EXCEPTION CLEAR LIQUIDS UNTIL 0700 (NO CREAM/MILK PRODUCTS).  ARRIVE AT 1115.  NEEDS ISTAT.  CURRENT EKG IN CHART AND EPIC.  WILL TAKE PRILOSEC AM DOS W/ SIPS OF WATER AND IF NEEDED TAKE TYLENOL.

## 2015-02-02 NOTE — Progress Notes (Signed)
   02/02/15 1330  OBSTRUCTIVE SLEEP APNEA  Have you ever been diagnosed with sleep apnea through a sleep study? No  Do you snore loudly (loud enough to be heard through closed doors)?  1  Do you often feel tired, fatigued, or sleepy during the daytime? 0  Has anyone observed you stop breathing during your sleep? 1  Do you have, or are you being treated for high blood pressure? 1  BMI more than 35 kg/m2? 1  Age over 53 years old? 1  Neck circumference greater than 40 cm/16 inches? 1  Gender: 1  Obstructive Sleep Apnea Score 7

## 2015-02-08 ENCOUNTER — Encounter (HOSPITAL_BASED_OUTPATIENT_CLINIC_OR_DEPARTMENT_OTHER): Payer: Self-pay | Admitting: *Deleted

## 2015-02-08 ENCOUNTER — Ambulatory Visit (HOSPITAL_BASED_OUTPATIENT_CLINIC_OR_DEPARTMENT_OTHER)
Admission: RE | Admit: 2015-02-08 | Discharge: 2015-02-08 | Disposition: A | Discharge status: Home / Self Care | Payer: Commercial Managed Care - HMO | Source: Ambulatory Visit | Attending: Specialist | Admitting: Specialist

## 2015-02-08 ENCOUNTER — Ambulatory Visit (HOSPITAL_BASED_OUTPATIENT_CLINIC_OR_DEPARTMENT_OTHER): Payer: Commercial Managed Care - HMO | Admitting: Anesthesiology

## 2015-02-08 ENCOUNTER — Encounter (HOSPITAL_BASED_OUTPATIENT_CLINIC_OR_DEPARTMENT_OTHER)
Admission: RE | Disposition: A | Discharge status: Home / Self Care | Payer: Self-pay | Source: Ambulatory Visit | Attending: Specialist

## 2015-02-08 DIAGNOSIS — F17290 Nicotine dependence, other tobacco product, uncomplicated: Secondary | ICD-10-CM | POA: Diagnosis not present

## 2015-02-08 DIAGNOSIS — N529 Male erectile dysfunction, unspecified: Secondary | ICD-10-CM | POA: Insufficient documentation

## 2015-02-08 DIAGNOSIS — M23221 Derangement of posterior horn of medial meniscus due to old tear or injury, right knee: Secondary | ICD-10-CM | POA: Insufficient documentation

## 2015-02-08 DIAGNOSIS — K219 Gastro-esophageal reflux disease without esophagitis: Secondary | ICD-10-CM | POA: Diagnosis not present

## 2015-02-08 DIAGNOSIS — E119 Type 2 diabetes mellitus without complications: Secondary | ICD-10-CM | POA: Insufficient documentation

## 2015-02-08 DIAGNOSIS — Z7982 Long term (current) use of aspirin: Secondary | ICD-10-CM | POA: Diagnosis not present

## 2015-02-08 DIAGNOSIS — Z87442 Personal history of urinary calculi: Secondary | ICD-10-CM | POA: Insufficient documentation

## 2015-02-08 DIAGNOSIS — M94261 Chondromalacia, right knee: Secondary | ICD-10-CM | POA: Insufficient documentation

## 2015-02-08 DIAGNOSIS — I10 Essential (primary) hypertension: Secondary | ICD-10-CM | POA: Diagnosis not present

## 2015-02-08 DIAGNOSIS — Z885 Allergy status to narcotic agent status: Secondary | ICD-10-CM | POA: Diagnosis not present

## 2015-02-08 DIAGNOSIS — I509 Heart failure, unspecified: Secondary | ICD-10-CM | POA: Diagnosis not present

## 2015-02-08 DIAGNOSIS — Z8619 Personal history of other infectious and parasitic diseases: Secondary | ICD-10-CM | POA: Insufficient documentation

## 2015-02-08 DIAGNOSIS — Z9889 Other specified postprocedural states: Secondary | ICD-10-CM

## 2015-02-08 DIAGNOSIS — M1711 Unilateral primary osteoarthritis, right knee: Secondary | ICD-10-CM | POA: Insufficient documentation

## 2015-02-08 HISTORY — DX: Unspecified tear of unspecified meniscus, current injury, right knee, initial encounter: S83.206A

## 2015-02-08 HISTORY — DX: Other specified personal risk factors, not elsewhere classified: Z91.89

## 2015-02-08 HISTORY — DX: Personal history of other infectious and parasitic diseases: Z86.19

## 2015-02-08 HISTORY — DX: Gastro-esophageal reflux disease without esophagitis: K21.9

## 2015-02-08 HISTORY — DX: Essential (primary) hypertension: I10

## 2015-02-08 HISTORY — PX: KNEE ARTHROSCOPY WITH MEDIAL MENISECTOMY: SHX5651

## 2015-02-08 HISTORY — DX: Personal history of other diseases of the circulatory system: Z86.79

## 2015-02-08 HISTORY — DX: Presence of spectacles and contact lenses: Z97.3

## 2015-02-08 HISTORY — DX: Personal history of other diseases of urinary system: Z87.448

## 2015-02-08 LAB — POCT I-STAT 4, (NA,K, GLUC, HGB,HCT)
Glucose, Bld: 131 mg/dL — ABNORMAL HIGH (ref 65–99)
HCT: 49 % (ref 39.0–52.0)
Hemoglobin: 16.7 g/dL (ref 13.0–17.0)
Potassium: 3.8 mmol/L (ref 3.5–5.1)
Sodium: 138 mmol/L (ref 135–145)

## 2015-02-08 LAB — GLUCOSE, CAPILLARY: Glucose-Capillary: 119 mg/dL — ABNORMAL HIGH (ref 65–99)

## 2015-02-08 SURGERY — ARTHROSCOPY, KNEE, WITH MEDIAL MENISCECTOMY
Anesthesia: General | Site: Knee | Laterality: Right

## 2015-02-08 MED ORDER — LACTATED RINGERS IV SOLN
INTRAVENOUS | Status: DC
  Administered 2015-02-08 (×2): via INTRAVENOUS
  Filled 2015-02-08: qty 1000

## 2015-02-08 MED ORDER — POVIDONE-IODINE 7.5 % EX SOLN
Freq: Once | CUTANEOUS | Status: DC
  Filled 2015-02-08: qty 118

## 2015-02-08 MED ORDER — LIDOCAINE HCL (CARDIAC) 20 MG/ML IV SOLN
INTRAVENOUS | Status: DC | PRN
  Administered 2015-02-08: 100 mg via INTRAVENOUS

## 2015-02-08 MED ORDER — ONDANSETRON HCL 4 MG/2ML IJ SOLN
INTRAMUSCULAR | Status: DC | PRN
  Administered 2015-02-08: 4 mg via INTRAVENOUS

## 2015-02-08 MED ORDER — FENTANYL CITRATE (PF) 100 MCG/2ML IJ SOLN
INTRAMUSCULAR | Status: AC
  Filled 2015-02-08: qty 4

## 2015-02-08 MED ORDER — MIDAZOLAM HCL 2 MG/2ML IJ SOLN
INTRAMUSCULAR | Status: AC
  Filled 2015-02-08: qty 2

## 2015-02-08 MED ORDER — MIDAZOLAM HCL 5 MG/5ML IJ SOLN
INTRAMUSCULAR | Status: DC | PRN
  Administered 2015-02-08 (×2): 1 mg via INTRAVENOUS

## 2015-02-08 MED ORDER — ASPIRIN EC 325 MG PO TBEC: 325.0000 mg | DELAYED_RELEASE_TABLET | Freq: Two times a day (BID) | ORAL | Status: DC

## 2015-02-08 MED ORDER — CEPHALEXIN 500 MG PO CAPS: 500.0000 mg | ORAL_CAPSULE | Freq: Three times a day (TID) | ORAL | Status: DC

## 2015-02-08 MED ORDER — CEFAZOLIN SODIUM-DEXTROSE 2-3 GM-% IV SOLR
2.0000 g | INTRAVENOUS | Status: AC
  Administered 2015-02-08: 2 g via INTRAVENOUS
  Filled 2015-02-08: qty 50

## 2015-02-08 MED ORDER — PROPOFOL 10 MG/ML IV BOLUS
INTRAVENOUS | Status: DC | PRN
  Administered 2015-02-08: 100 mg via INTRAVENOUS
  Administered 2015-02-08: 200 mg via INTRAVENOUS

## 2015-02-08 MED ORDER — BUPIVACAINE HCL 0.25 % IJ SOLN
INTRAMUSCULAR | Status: DC | PRN
  Administered 2015-02-08: 30 mL

## 2015-02-08 MED ORDER — SODIUM CHLORIDE 0.9 % IR SOLN
Status: DC | PRN
  Administered 2015-02-08: 6000 mL

## 2015-02-08 MED ORDER — FENTANYL CITRATE (PF) 100 MCG/2ML IJ SOLN
25.0000 ug | INTRAMUSCULAR | Status: DC | PRN
  Filled 2015-02-08: qty 1

## 2015-02-08 MED ORDER — KETOROLAC TROMETHAMINE 30 MG/ML IJ SOLN
30.0000 mg | Freq: Once | INTRAMUSCULAR | Status: DC
  Filled 2015-02-08: qty 1

## 2015-02-08 MED ORDER — PROMETHAZINE HCL 25 MG/ML IJ SOLN
6.2500 mg | INTRAMUSCULAR | Status: DC | PRN
Start: 2015-02-08 — End: 2015-02-08
  Filled 2015-02-08: qty 1

## 2015-02-08 MED ORDER — HYDROCODONE-ACETAMINOPHEN 5-325 MG PO TABS: 1.0000 | ORAL_TABLET | ORAL | Status: DC | PRN

## 2015-02-08 MED ORDER — DEXAMETHASONE SODIUM PHOSPHATE 4 MG/ML IJ SOLN
INTRAMUSCULAR | Status: DC | PRN
  Administered 2015-02-08: 10 mg via INTRAVENOUS

## 2015-02-08 MED ORDER — FENTANYL CITRATE (PF) 100 MCG/2ML IJ SOLN
INTRAMUSCULAR | Status: DC | PRN
  Administered 2015-02-08 (×4): 25 ug via INTRAVENOUS
  Administered 2015-02-08 (×2): 50 ug via INTRAVENOUS

## 2015-02-08 MED ORDER — CEFAZOLIN SODIUM-DEXTROSE 2-3 GM-% IV SOLR
INTRAVENOUS | Status: AC
  Filled 2015-02-08: qty 50

## 2015-02-08 MED ORDER — ACETAMINOPHEN 10 MG/ML IV SOLN
INTRAVENOUS | Status: DC | PRN
  Administered 2015-02-08: 1000 mg via INTRAVENOUS

## 2015-02-08 SURGICAL SUPPLY — 59 items
BANDAGE ESMARK 6X9 LF (GAUZE/BANDAGES/DRESSINGS) ×1 IMPLANT
BLADE 4.2CUDA (BLADE) IMPLANT
BLADE CUDA 4.2 (BLADE) IMPLANT
BLADE CUDA GRT WHITE 3.5 (BLADE) ×2 IMPLANT
BLADE CUDA SHAVER 3.5 (BLADE) IMPLANT
BNDG CMPR 9X6 STRL LF SNTH (GAUZE/BANDAGES/DRESSINGS) ×1
BNDG ESMARK 6X9 LF (GAUZE/BANDAGES/DRESSINGS) ×2
BNDG GAUZE ELAST 4 BULKY (GAUZE/BANDAGES/DRESSINGS) ×3 IMPLANT
CANISTER SUCT LVC 12 LTR MEDI- (MISCELLANEOUS) ×5 IMPLANT
CANISTER SUCTION 1200CC (MISCELLANEOUS) ×2 IMPLANT
CLOTH BEACON ORANGE TIMEOUT ST (SAFETY) ×2 IMPLANT
CUFF TOURNIQUET SINGLE 34IN LL (TOURNIQUET CUFF) ×2 IMPLANT
DRAPE ARTHROSCOPY W/POUCH 114 (DRAPES) ×2 IMPLANT
DRAPE INCISE 23X17 IOBAN STRL (DRAPES) ×1
DRAPE INCISE 23X17 STRL (DRAPES) ×1 IMPLANT
DRAPE INCISE IOBAN 23X17 STRL (DRAPES) ×1 IMPLANT
DRAPE INCISE IOBAN 66X45 STRL (DRAPES) ×2 IMPLANT
DRSG PAD ABDOMINAL 8X10 ST (GAUZE/BANDAGES/DRESSINGS) ×1 IMPLANT
DURAPREP 26ML APPLICATOR (WOUND CARE) ×2 IMPLANT
ELECT MENISCUS 165MM 90D (ELECTRODE) IMPLANT
ELECT REM PT RETURN 9FT ADLT (ELECTROSURGICAL)
ELECTRODE REM PT RTRN 9FT ADLT (ELECTROSURGICAL) IMPLANT
GAUZE XEROFORM 1X8 LF (GAUZE/BANDAGES/DRESSINGS) ×2 IMPLANT
GLOVE BIO SURGEON STRL SZ7.5 (GLOVE) ×2 IMPLANT
GLOVE BIO SURGEON STRL SZ8 (GLOVE) ×4 IMPLANT
GLOVE BIOGEL PI IND STRL 7.5 (GLOVE) IMPLANT
GLOVE BIOGEL PI INDICATOR 7.5 (GLOVE) ×2
GLOVE INDICATOR 8.0 STRL GRN (GLOVE) ×4 IMPLANT
GLOVE SURG SS PI 7.5 STRL IVOR (GLOVE) ×1 IMPLANT
GOWN STRL REUS W/ TWL LRG LVL3 (GOWN DISPOSABLE) ×1 IMPLANT
GOWN STRL REUS W/ TWL XL LVL3 (GOWN DISPOSABLE) ×1 IMPLANT
GOWN STRL REUS W/TWL LRG LVL3 (GOWN DISPOSABLE) ×3 IMPLANT
GOWN STRL REUS W/TWL XL LVL3 (GOWN DISPOSABLE) ×4
IMMOBILIZER KNEE 22 UNIV (SOFTGOODS) IMPLANT
IMMOBILIZER KNEE 24 THIGH 36 (MISCELLANEOUS) IMPLANT
IMMOBILIZER KNEE 24 UNIV (MISCELLANEOUS)
IV NS IRRIG 3000ML ARTHROMATIC (IV SOLUTION) ×2 IMPLANT
KNEE WRAP E Z 3 GEL PACK (MISCELLANEOUS) ×2 IMPLANT
MANIFOLD NEPTUNE II (INSTRUMENTS) IMPLANT
MINI VAC (SURGICAL WAND) ×2 IMPLANT
NEEDLE HYPO 22GX1.5 SAFETY (NEEDLE) ×1 IMPLANT
PACK ARTHROSCOPY DSU (CUSTOM PROCEDURE TRAY) ×2 IMPLANT
PACK BASIN DAY SURGERY FS (CUSTOM PROCEDURE TRAY) ×2 IMPLANT
PAD ABD 8X10 STRL (GAUZE/BANDAGES/DRESSINGS) ×4 IMPLANT
PAD ARMBOARD 7.5X6 YLW CONV (MISCELLANEOUS) IMPLANT
PADDING CAST ABS 4INX4YD NS (CAST SUPPLIES) ×1
PADDING CAST ABS COTTON 4X4 ST (CAST SUPPLIES) ×1 IMPLANT
PENCIL BUTTON HOLSTER BLD 10FT (ELECTRODE) IMPLANT
SET ARTHROSCOPY TUBING (MISCELLANEOUS) ×2
SET ARTHROSCOPY TUBING LN (MISCELLANEOUS) ×1 IMPLANT
SPONGE GAUZE 4X4 12PLY (GAUZE/BANDAGES/DRESSINGS) ×2 IMPLANT
SPONGE GAUZE 4X4 12PLY STER LF (GAUZE/BANDAGES/DRESSINGS) ×1 IMPLANT
SUT ETHILON 4 0 PS 2 18 (SUTURE) ×2 IMPLANT
SYR CONTROL 10ML LL (SYRINGE) ×2 IMPLANT
TOWEL OR 17X24 6PK STRL BLUE (TOWEL DISPOSABLE) ×2 IMPLANT
TUBE CONNECTING 12X1/4 (SUCTIONS) ×2 IMPLANT
WAND 30 DEG SABER W/CORD (SURGICAL WAND) IMPLANT
WAND 90 DEG TURBOVAC W/CORD (SURGICAL WAND) IMPLANT
WATER STERILE IRR 500ML POUR (IV SOLUTION) ×2 IMPLANT

## 2015-02-08 NOTE — Op Note (Signed)
[phone]

## 2015-02-08 NOTE — Interval H&P Note (Signed)
History and Physical Interval Note:  02/08/2015 12:43 PM  Travis Bowman  has presented today for surgery, with the diagnosis of RIGHT KNEE TORN MEDIAL MENISCUS, TORN LATERAL MENISCUS  The various methods of treatment have been discussed with the patient and family. After consideration of risks, benefits and other options for treatment, the patient has consented to  Procedure(s) with comments: RIGHT KNEE ARTHROSCOPY DEBRIDEMENT, PARTIAL MEDIAL MENISECTOMY, PARTIAL LATERAL MENISCECTOMY, CHRONDROPLASTY (Right) - ANESTHESIA: GENERAL, KNEE BLOCK as a surgical intervention .  The patient's history has been reviewed, patient examined, no change in status, stable for surgery.  I have reviewed the patient's chart and labs.  Questions were answered to the patient's satisfaction.     Raheel Kunkle,Victor ANDREW

## 2015-02-08 NOTE — Anesthesia Postprocedure Evaluation (Signed)
  Anesthesia Post-op Note  Patient: Travis Bowman  Procedure(s) Performed: Procedure(s) (LRB): RIGHT KNEE ARTHROSCOPY, PARTIAL MEDIAL MENISCECTOMY, CHRONDROPLASTY (Right)  Patient Location: PACU  Anesthesia Type: General  Level of Consciousness: awake and alert   Airway and Oxygen Therapy: Patient Spontanous Breathing  Post-op Pain: mild  Post-op Assessment: Post-op Vital signs reviewed, Patient's Cardiovascular Status Stable, Respiratory Function Stable, Patent Airway and No signs of Nausea or vomiting  Last Vitals:  Filed Vitals:   02/08/15 1457  BP: 134/84  Pulse: 62  Temp: 36.4 C  Resp: 14    Post-op Vital Signs: stable   Complications: No apparent anesthesia complications

## 2015-02-08 NOTE — Discharge Instructions (Signed)

## 2015-02-08 NOTE — Transfer of Care (Signed)
Immediate Anesthesia Transfer of Care Note  Patient: Travis Bowman  Procedure(s) Performed: Procedure(s) (LRB): RIGHT KNEE ARTHROSCOPY, PARTIAL MEDIAL MENISCECTOMY, CHRONDROPLASTY (Right)  Patient Location: PACU  Anesthesia Type: General  Level of Consciousness: awake, sedated, patient cooperative and responds to stimulation  Airway & Oxygen Therapy: Patient Spontanous Breathing and Patient connected to face mask oxygen  Post-op Assessment: Report given to PACU RN, Post -op Vital signs reviewed and stable and Patient moving all extremities  Post vital signs: Reviewed and stable  Complications: No apparent anesthesia complications

## 2015-02-08 NOTE — H&P (Signed)
Travis Bowman is an 54 y.o. male.   Chief Complaint: Right knee pain HPI: Patient presents with joint discomfort that had been persistent for several months now. Despite conservative treatments, his discomfort has not improved. Imaging was obtained. Other conservative and surgical treatments were discussed in detail. Patient wishes to proceed with surgery as consented. Denies SOB, CP, or calf pain. No Fever, chills, or nausea/ vomiting.   Past Medical History  Diagnosis Date  . ED (erectile dysfunction)   . Type 2 diabetes mellitus   . History of nephrolithiasis   . 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 Right knee meniscal tear   . GERD (gastroesophageal reflux disease)   . Malignant essential hypertension   . History of CHF (congestive heart failure)     mild acute episode 12-05-2013 in setting of HTN crisis--  resolved  . History of renal disease nephrologist-  dr Joni Fears  w/ [facility]    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 May 2015--  renal function recovered  . Wears contact lenses   . At risk for sleep apnea     STOP-BANG= 7   SENT TO PCP 02-02-2015    Past Surgical History  Procedure Laterality Date  . Orchiectomy Right age 82  . Transthoracic echocardiogram  12-06-2013    mild LVH,  ef 50-55%,  mild LAE and RAE,  mild dilated aortic root  . Tonsillectomy and adenoidectomy  as child  . Trigger finger release Bilateral last one 2013  . Cataract extraction w/ intraocular lens  implant, bilateral  2003/  2006  . Extracorporeal shock wave lithotripsy  x5  last one 2006  . Percutaneous nephrostolithotomy  x2  last one 1980's  . Ureterolithotomy  1990's    Family History  Problem Relation Age of Onset  . Stroke Mother   . CAD Mother   . Alcohol abuse Father   . Diabetes Brother   . Marfan syndrome Brother    Social History:  reports that he has been smoking Cigars.  He  has never used smokeless tobacco. He reports that he drinks about 1.8 oz of alcohol per week. He reports that he does not use illicit drugs.  Allergies:  Allergies  Allergen Reactions  . Morphine And Related Nausea And Vomiting    Medications Prior to Admission  Medication Sig Dispense Refill  . aspirin 81 MG tablet Take 81 mg by mouth daily.    . chlorthalidone (HYGROTON) 25 MG tablet Take 1 tablet by mouth every morning.   12  . glimepiride (AMARYL) 1 MG tablet Take 1 tablet by mouth daily with breakfast.   5  . lisinopril (PRINIVIL,ZESTRIL) 40 MG tablet Take 1 tablet by mouth every evening.   3  . omeprazole (PRILOSEC) 40 MG capsule Take 40 mg by mouth every morning.    [name] VIAGRA 100 MG tablet as needed.  5  . acetaminophen (TYLENOL) 500 MG tablet Take 500-1,000 mg by mouth every 6 (six) hours as needed for headache.      Results for orders placed or performed during the hospital encounter of 02/08/15 (from the past 48 hour(s))  I-STAT 4, (NA,K, GLUC, HGB,HCT)     Status: Abnormal   Collection Time: 02/08/15 11:54 AM  Result Value Ref Range   Sodium 138 135 - 145 mmol/Bowman   Potassium 3.8 3.5 -  5.1 mmol/Bowman   Glucose, Bld 131 (H) 65 - 99 mg/dL   HCT 49.0 39.0 - 52.0 %   Hemoglobin 16.7 13.0 - 17.0 g/dL   No results found.  Review of Systems  Constitutional: Negative.   HENT: Negative.   Eyes: Negative.   Respiratory: Negative.   Cardiovascular: Negative.   Gastrointestinal: Negative.   Genitourinary: Negative.   Musculoskeletal: Positive for joint pain.  Skin: Negative.   Neurological: Negative.   Endo/Heme/Allergies: Negative.   Psychiatric/Behavioral: Negative.     Blood pressure 130/82, pulse 66, temperature 98.6 F (37 C), temperature source Oral, resp. rate 20, height 5\' 8"  (1.727 m), weight 106.595 kg (235 lb), SpO2 97 %. Physical Exam  Constitutional: He is oriented to person, place, and time. He appears well-developed.  HENT:  Head: Normocephalic.  Eyes: EOM  are normal.  Neck: Normal range of motion.  Cardiovascular: Normal rate, normal heart sounds and intact distal pulses.   Respiratory: Effort normal and breath sounds normal.  GI: Soft. Bowel sounds are normal.  Genitourinary:  Deferred  Musculoskeletal:  Right knee pain with ROM. RLE grossly n/v intact.  Neurological: He is alert and oriented to person, place, and time.  Skin: Skin is warm and dry.  Psychiatric: His behavior is normal.     Assessment/Plan Right knee OA, TLM, and TMM Right knee scope as consented D/c home today Follow instructions F/u in office  Travis Bowman 02/08/2015, 12:27 PM

## 2015-02-08 NOTE — Anesthesia Procedure Notes (Signed)
Procedure Name: LMA Insertion Date/Time: 02/08/2015 1:02 PM Performed by: Justice Rocher Pre-anesthesia Checklist: Patient identified, Emergency Drugs available, Suction available and Patient being monitored Patient Re-evaluated:Patient Re-evaluated prior to inductionOxygen Delivery Method: Circle System Utilized Preoxygenation: Pre-oxygenation with 100% oxygen Intubation Type: IV induction Ventilation: Mask ventilation without difficulty LMA: LMA inserted LMA Size: 5.0 Number of attempts: 1 Airway Equipment and Method: Bite block Placement Confirmation: positive ETCO2 Tube secured with: Tape Dental Injury: Teeth and Oropharynx as per pre-operative assessment

## 2015-02-08 NOTE — H&P (View-Only) (Signed)
   02/02/15 1330  OBSTRUCTIVE SLEEP APNEA  Have you ever been diagnosed with sleep apnea through a sleep study? No  Do you snore loudly (loud enough to be heard through closed doors)?  1  Do you often feel tired, fatigued, or sleepy during the daytime? 0  Has anyone observed you stop breathing during your sleep? 1  Do you have, or are you being treated for high blood pressure? 1  BMI more than 35 kg/m2? 1  Age over 53 years old? 1  Neck circumference greater than 40 cm/16 inches? 1  Gender: 1  Obstructive Sleep Apnea Score 7

## 2015-02-08 NOTE — Anesthesia Preprocedure Evaluation (Addendum)
Anesthesia Evaluation  Patient identified by MRN, date of birth, ID band Patient awake    Reviewed: Allergy & Precautions, NPO status , Patient's Chart, lab work & pertinent test results  Airway Mallampati: II  TM Distance: <3 FB Neck ROM: Full    Dental no notable dental hx. (+) Teeth Intact, Dental Advisory Given   Pulmonary neg pulmonary ROS, Current Smoker,  breath sounds clear to auscultation  Pulmonary exam normal       Cardiovascular Exercise Tolerance: Good hypertension, Pt. on medications Normal cardiovascular examRhythm:Regular Rate:Normal     Neuro/Psych negative neurological ROS  negative psych ROS   GI/Hepatic Neg liver ROS, GERD-  Medicated and Controlled,Hx of Hepatitis C- resolved   Endo/Other  diabetes, Well Controlled, Type 2  Renal/GU negative Renal ROSChronic calculus  negative genitourinary   Musculoskeletal negative musculoskeletal ROS (+)   Abdominal   Peds negative pediatric ROS (+)  Hematology negative hematology ROS (+)   Anesthesia Other Findings   Reproductive/Obstetrics negative OB ROS                            Anesthesia Physical Anesthesia Plan  ASA: II  Anesthesia Plan: General   Post-op Pain Management:    Induction: Intravenous  Airway Management Planned: LMA  Additional Equipment:   Intra-op Plan:   Post-operative Plan: Extubation in OR  Informed Consent: I have reviewed the patients History and Physical, chart, labs and discussed the procedure including the risks, benefits and alternatives for the proposed anesthesia with the patient or authorized representative who has indicated his/her understanding and acceptance.   Dental advisory given  Plan Discussed with: CRNA and Surgeon  Anesthesia Plan Comments:         Anesthesia Quick Evaluation

## 2015-02-09 ENCOUNTER — Encounter (HOSPITAL_BASED_OUTPATIENT_CLINIC_OR_DEPARTMENT_OTHER): Payer: Self-pay | Admitting: Specialist

## 2015-02-09 NOTE — Op Note (Signed)
NAME:  Travis Bowman, Travis Bowman NO.:  [id number]  MEDICAL RECORD NO.:  [id number]  LOCATION:                               FACILITY:  [facility]  PHYSICIAN:  Cynda Familia, M.D.DATE OF BIRTH:  January 25, 1961  DATE OF PROCEDURE:  02/08/2015 DATE OF DISCHARGE:  02/08/2015                              OPERATIVE REPORT   PREOPERATIVE DIAGNOSES:  Right knee possible torn medial meniscus, complex with possible torn lateral meniscus, chondromalacia.  POSTOPERATIVE DIAGNOSES: 1. Right knee horizontal cleavage tear, posterior horn medial     meniscus. 2. 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  PROCEDURES: 1. Right knee arthroscopic partial medial meniscectomy. 2. Complex medial femoral condyle.  SURGEON:  Cynda Familia, M.D.  ASSISTANT:  Jerilynn Mages, PA-C.  ANESTHESIA:  General with intraoperative knee block.  ESTIMATED BLOOD LOSS:  Minimal.  DRAINS:  None.  COMPLICATIONS:  None.  TOURNIQUET TIME:  30 minutes at 250 mmHg.  DISPOSITION:  PACU, stable.  OPERATIVE DETAILS:  The patient and family were counseled in the holding area.  Correct site was marked and signed appropriately.  IV was started.  Sedation was given and IV antibiotics.  In the OR, placed in supine position under general anesthesia.  Following this, the right thigh was placed in a thigh holder.  Prepped with DuraPrep and draped in sterile fashion and TED hose had been applied on uninvolved leg.  The right thigh was then prepped with DuraPrep and draped in sterile fashion.  Time-out was done confirming the right side.  Exsanguinated with Esmarch.  Tourniquet was inflated to 300 mmHg.  Arthroscopic portals were established; proximal medial, inferomedial, and inferolateral.  Diagnostic arthroscopy revealed normal tracking in the patellofemoral joint and mild chondromalacia.  Suprapatellar pouch, medial and lateral gutters unremarkable.   ACL and PCL were intact. Lateral side inspected.  Lateral femoral condyle unremarkable.  Lateral meniscus was intact, thoroughly inspected and probed.  Lateral tibial plateau showed some very mild softening and did not require chondroplasty.  Medial side was inspected.  There was extensive grade 3 and grade 4 chondromalacia of the medial femoral condyle.  Chondroplasty was utilized at the stable base.  There was also grade 1 and 2 medial tibial plateau.  Then, the medial meniscus was thoroughly inspected and probed, and found to have concealed horizontal cleavage tear.  Utilized the baskets and motorized shaver and MiniVac system, partial medial meniscectomy was performed back to a stable base and properly contoured. There were no abnormalities noted.  The knee was irrigated and arthroscopic equipment was removed.  There were no left much meniscus as possible and in fact there was degeneration in the medial condyle. Following this, arthroscopic portals were then closed with 4-0 nylon and 10 mL of Sensorcaine was placed in the skin and 4 mL of morphine sulfate was injected in the knee joint.  Sterile dressing applied, TED hose, ice pack.  No complications.  He was then awakened after tourniquet was deflated.  He was taken from the operating room to the PACU in stable condition.  He will be stabilized in PACU and discharged home.  ______________________________ Cynda Familia, M.D.     RAC/MEDQ  D:  02/08/2015  T:  02/09/2015  Job:  [id number]

## 2015-11-30 ENCOUNTER — Other Ambulatory Visit: Payer: Self-pay | Admitting: Urology

## 2015-11-30 DIAGNOSIS — C61 Malignant neoplasm of prostate: Secondary | ICD-10-CM

## 2015-12-11 ENCOUNTER — Encounter (HOSPITAL_COMMUNITY)
Admission: RE | Admit: 2015-12-11 | Discharge: 2015-12-11 | Disposition: A | Discharge status: Home / Self Care | Payer: Commercial Managed Care - HMO | Source: Ambulatory Visit | Attending: Urology | Admitting: Urology

## 2015-12-11 ENCOUNTER — Encounter: Payer: Self-pay | Admitting: Medical Oncology

## 2015-12-11 ENCOUNTER — Telehealth: Payer: Self-pay | Admitting: Medical Oncology

## 2015-12-11 DIAGNOSIS — C61 Malignant neoplasm of prostate: Secondary | ICD-10-CM | POA: Insufficient documentation

## 2015-12-11 MED ORDER — TECHNETIUM TC 99M MEDRONATE IV KIT
26.6000 | PACK | Freq: Once | INTRAVENOUS | Status: AC | PRN
  Administered 2015-12-11: 26.6 via INTRAVENOUS

## 2015-12-11 NOTE — Telephone Encounter (Signed)
Oncology Nurse Navigator Documentation  Oncology Nurse Navigator Flowsheets 12/11/2015  Navigator Location [facility]  Navigator Encounter Type Introductory phone call- Left message requesting return call regarding referral to Prostate [facility]  Abnormal Finding Date 09/13/2015  Confirmed Diagnosis Date 11/28/2015  Acuity Level 1  Acuity Level 1 Initial guidance, education and coordination as needed  Time Spent with Patient 15

## 2015-12-18 ENCOUNTER — Encounter: Payer: Self-pay | Admitting: Medical Oncology

## 2015-12-24 ENCOUNTER — Telehealth: Payer: Self-pay | Admitting: Medical Oncology

## 2015-12-24 NOTE — Progress Notes (Signed)
                               Care Plan Summary  Name: Travis Bowman DOB: 19-Sep-1960   Your Medical Team:   Urologist -  Dr. Raynelle Bring, [facility]  Radiation Oncologist - Dr. Tyler Pita, [facility]   Medical Oncologist - Dr. Zola Button, [place]  Recommendations: 1) Prostatectomy  2) Radiation with Androgen Deprivation   * These recommendations are based on information available as of today's consult.      Recommendations may change depending on the results of further tests or exams.  Next Steps: 1) Dr. Lynne Logan  Office will call to schedule   When appointments need to be scheduled, you will be contacted by [facility] and/or [facility].  Questions?  Please do not hesitate to call Cira Rue, RN, BSN, OCN at [phone] any questions or concerns.  Shirlean Mylar is your Oncology Nurse Navigator and is available to assist you while you're receiving your medical care at [facility].

## 2015-12-24 NOTE — Telephone Encounter (Signed)
Oncology Nurse Navigator Documentation  Oncology Nurse Navigator Flowsheets 12/11/2015 12/18/2015 12/24/2015  Navigator Location [facility] - -  Navigator Encounter Type Introductory phone call Telephone Telephone  Telephone - [place] Call Outgoing Call;Appt Confirmation/Clarification- Spoke with [name] to confirm his appointment for Prostate [facility] 6/27 arriving at 12:30pm. I reminded him to bring his completed medical forms and to have lunch before his arrival. He voiced understanding.  Abnormal Finding Date 09/13/2015 - -  Confirmed Diagnosis Date 11/28/2015 - -  Barriers/Navigation Needs - Coordination of Care No barriers at this time  Support Groups/Services - - Friends and Family  Acuity Level 1 - -  Acuity Level 1 Initial guidance, education and coordination as needed - -  Time Spent with Patient 15 15 15

## 2015-12-24 NOTE — Progress Notes (Signed)
I called pt to introduce myself as the Prostate Nurse Navigator and the Coordinator of the Prostate [place].  1. I confirmed with the patient he is aware of his referral to the clinic 12/25/15 arriving at 12:30pm. I asked him to have lunch before his arrival due to the length of the clinic.  2. I discussed the format of the clinic and the physicians he will be seeing that day.  3. I discussed where the clinic is located and how to contact me.  4. I confirmed his address and informed him I would be mailing a packet of information and forms to be completed. I asked him to bring them with him the day of his appointment.   He voiced understanding of the above. I asked him to call me if he has any questions or concerns regarding his appointments or the forms he needs to complete.

## 2015-12-25 ENCOUNTER — Ambulatory Visit
Admission: RE | Admit: 2015-12-25 | Discharge: 2015-12-25 | Disposition: A | Discharge status: Home / Self Care | Payer: Commercial Managed Care - HMO | Source: Ambulatory Visit | Attending: Radiation Oncology | Admitting: Radiation Oncology

## 2015-12-25 ENCOUNTER — Encounter: Payer: Self-pay | Admitting: Radiation Oncology

## 2015-12-25 ENCOUNTER — Encounter: Payer: Self-pay | Admitting: Medical Oncology

## 2015-12-25 ENCOUNTER — Ambulatory Visit (HOSPITAL_BASED_OUTPATIENT_CLINIC_OR_DEPARTMENT_OTHER): Payer: Commercial Managed Care - HMO | Admitting: Oncology

## 2015-12-25 VITALS — BP 149/82 | HR 76 | Temp 98.4°F | Resp 16 | Wt 250.1 lb

## 2015-12-25 DIAGNOSIS — C61 Malignant neoplasm of prostate: Secondary | ICD-10-CM

## 2015-12-25 DIAGNOSIS — E669 Obesity, unspecified: Secondary | ICD-10-CM | POA: Diagnosis not present

## 2015-12-25 DIAGNOSIS — E119 Type 2 diabetes mellitus without complications: Secondary | ICD-10-CM | POA: Diagnosis not present

## 2015-12-25 DIAGNOSIS — Z833 Family history of diabetes mellitus: Secondary | ICD-10-CM

## 2015-12-25 HISTORY — DX: Malignant neoplasm of prostate: C61

## 2015-12-25 NOTE — Progress Notes (Addendum)
GU Location of Tumor / Histology: prostatic adenocarcinoma  If Prostate Cancer, Gleason Score is (4 + 5) and PSA is (4.59) February 2017  Travis Bowman has hx of stones thus, was known to Dr. Jeffie Pollock. Patient referred himself back to Dr. Jeffie Pollock for an elevated PSA  Biopsies of prostate (if applicable) revealed:    Past/Anticipated interventions by urology, if any: biopsy and referral to [facility]  Past/Anticipated interventions by medical oncology, if any:   Weight changes, if any: no  Bowel/Bladder complaints, if any: nocturia and ED   Nausea/Vomiting, if any: no  Pain issues, if any:  no  SAFETY ISSUES:  Prior radiation? no  Pacemaker/ICD? no  Possible current pregnancy? no  Is the patient on methotrexate? no  Current Complaints / other details:  55 year old male. 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 AX: Morphine.

## 2015-12-25 NOTE — Addendum Note (Signed)
Encounter addended by: Raynelle Bring, MD on: 12/25/2015  4:40 PM<BR>     Documentation filed: Clinical Notes

## 2015-12-25 NOTE — Progress Notes (Signed)
Reason for Referral: Prostate cancer.   HPI: 55 year old gentleman currently of [place] where he lived the majority of his life. He is a gentleman with history of obesity and diabetes and currently works as a Airline pilot in the [employer]. He was found to have an elevated PSA in February 2017 up to 4.59. Prostate biopsy obtained on 11/28/2015 done by Dr. Jeffie Pollock showed a prostate adenocarcinoma Gleason score 4+4 = 8 in 1 core and 4+5 = 9 in another core. He also had a pattern of 7 and 6 and 3 other cores. His staging workup was unrevealing for metastatic disease. His digital rectal examination did show a tumor at the right base. He does have urinary symptoms including frequency and nocturia. No hematuria or dysuria. He was referred to the prostate cancer multidisciplinary clinic for discussion. He continues to be active and perform activities of daily living.  He does not report any headaches, blurry vision, syncope or seizures. He does not report any fevers or chills or sweats. He does not report any cough or hemoptysis. He does not report any nausea, vomiting or abdominal pain. He does not report any hematuria or dysuria. Remaining review of systems unremarkable.   Past Medical History  Diagnosis Date  . ED (erectile dysfunction)   . Type 2 diabetes mellitus ([place])   . History of nephrolithiasis   . 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 Right knee meniscal tear   . GERD (gastroesophageal reflux disease)   . Malignant essential hypertension   . History of CHF (congestive heart failure)     mild acute episode 12-05-2013 in setting of HTN crisis--  resolved  . 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 Wears contact lenses   . At risk for sleep apnea     STOP-BANG= 7   SENT TO  PCP 02-02-2015  . Prostate cancer [facility])   :  Past Surgical History  Procedure Laterality Date  . Orchiectomy Right age 63  . Transthoracic echocardiogram  12-06-2013    mild LVH,  ef 50-55%,  mild LAE and RAE,  mild dilated aortic root  . Tonsillectomy and adenoidectomy  as child  . Trigger finger release Bilateral last one 2013  . Cataract extraction w/ intraocular lens  implant, bilateral  2003/  2006  . Extracorporeal shock wave lithotripsy  x5  last one 2006  . Percutaneous nephrostolithotomy  x2  last one 1980's  . Ureterolithotomy  1990's  . Knee arthroscopy with medial menisectomy Right 02/08/2015    Procedure: RIGHT KNEE ARTHROSCOPY, PARTIAL MEDIAL MENISCECTOMY, CHRONDROPLASTY;  Surgeon: Sydnee Cabal, MD;  Location: [place];  Service: Orthopedics;  Laterality: Right;  . Prostate biopsy    :   Current outpatient prescriptions:  .  aspirin EC 325 MG tablet, Take 1 tablet (325 mg total) by mouth 2 (two) times daily., Disp: 30 tablet, Rfl: 0 .  cephALEXin (KEFLEX) 500 MG capsule, Take 1 capsule (500 mg total) by mouth 3 (three) times daily., Disp: 12 capsule, Rfl: 0 .  chlorthalidone (HYGROTON) 25 MG tablet, Take 1 tablet by mouth every morning. , Disp: , Rfl: 12 .  glimepiride (AMARYL) 1 MG tablet, Take 1 tablet by mouth daily with breakfast. ,  Disp: , Rfl: 5 .  HYDROcodone-acetaminophen (NORCO) 5-325 MG per tablet, Take 1-2 tablets by mouth every 4 (four) hours as needed for moderate pain., Disp: 60 tablet, Rfl: 0 .  lisinopril (PRINIVIL,ZESTRIL) 40 MG tablet, Take 1 tablet by mouth every evening. , Disp: , Rfl: 3 .  omeprazole (PRILOSEC) 40 MG capsule, Take 40 mg by mouth every morning., Disp: , Rfl:  .  VIAGRA 100 MG tablet, as needed., Disp: , Rfl: 5:  Allergies  Allergen Reactions  . Morphine And Related Nausea And Vomiting  :  Family History  Problem Relation Age of Onset  . Stroke Mother   . CAD Mother   . Alcohol abuse Father   . Diabetes  Brother   . Marfan syndrome Brother   :  Social History   Social History  . Marital Status: Married    Spouse Name: N/A  . Number of Children: 3  . Years of Education: 12   Occupational History  . firefighter [employer] History Main Topics  . Smoking status: Current Some Day Smoker -- 1.00 packs/day    Types: Cigars    Last Attempt to Quit: 08/07/2005  . Smokeless tobacco: Never Used     Comment: currently occasoinal cigar--  quit cigarettes in 2007 had smoked for 25 yrs  . Alcohol Use: 1.8 oz/week    3 Cans of beer per week     Comment: occasional drink  . Drug Use: No  . Sexual Activity: Yes   Other Topics Concern  . Not on file   Social History Narrative  :  Pertinent items are noted in HPI.  Exam: There were no vitals taken for this visit. General appearance: alert and cooperative Head: Normocephalic, without obvious abnormality Throat: lips, mucosa, and tongue normal; teeth and gums normal Neck: no adenopathy Back: negative Resp: clear to auscultation bilaterally Chest wall: no tenderness Cardio: regular rate and rhythm, S1, S2 normal, no murmur, click, rub or gallop GI: soft, non-tender; bowel sounds normal; no masses,  no organomegaly Extremities: extremities normal, atraumatic, no cyanosis or edema Skin: Skin color, texture, turgor normal. No rashes or lesions  CBC    Component Value Date/Time   WBC 5.2 12/08/2013 0327   RBC 3.96* 12/08/2013 0327   HGB 16.7 02/08/2015 1154   HCT 49.0 02/08/2015 1154   PLT 164 12/08/2013 0327   MCV 88.6 12/08/2013 0327   MCH 31.1 12/08/2013 0327   MCHC 35.0 12/08/2013 0327   RDW 13.0 12/08/2013 0327   LYMPHSABS 2.7 12/05/2013 2257   MONOABS 0.6 12/05/2013 2257   EOSABS 0.3 12/05/2013 2257   BASOSABS 0.0 12/05/2013 2257      Nm Bone Scan Whole Body  12/11/2015  CLINICAL DATA:  Newly diagnosed prostate carcinoma with elevated PSA EXAM: NUCLEAR MEDICINE WHOLE BODY BONE SCAN TECHNIQUE: Whole  body anterior and posterior images were obtained approximately 3 hours after intravenous injection of radiopharmaceutical. RADIOPHARMACEUTICALS:  26.6 mCi Technetium-70m medronate IV COMPARISON:  None. FINDINGS: There is adequate uptake of radioactive tracer throughout the bony skeleton. Bilateral renal activity is noted. Some degenerative changes are noted about the knee joints and shoulder joints. No focal area of increased activity is identified to suggest metastatic disease. IMPRESSION: No evidence of metastatic disease. Electronically Signed   By: Inez Catalina M.D.   On: 12/11/2015 14:48    Assessment and Plan:   55 year old gentleman with prostate cancer diagnosed in May 2017. He had a high grade disease with  Gleason score of 4+5 = 9 at the right base. His PSA was 4.59. His staging workup did not reveal any evidence of metastatic disease.  The natural course of this disease was reviewed today and options of therapy were discussed as a part of the multidisciplinary clinic after reviewing his pathology as well as imaging studies. He is an excellent candidate for primary surgical therapy with the potential of using adjuvant radiation therapy depending on his final pathology. Alternatively, primary external beam radiation with androgen deprivation would be an alternative. He is leaning towards having surgery despite knowing that potential adjuvant radiation therapy might be in his future.  The need for definitive therapy is important as his disease at a high risk and might develop metastatic disease. This complication and ramifications were discussed today and he understands the importance of obtaining curative therapy at this time.

## 2015-12-25 NOTE — Consult Note (Signed)
Office Visit Report     12/23/2015   --------------------------------------------------------------------------------   Travis Bowman  MRN: [id number]  PRIMARY CARE:  Lujean Amel, MD  DOB: [birth year], 55 year old Male  REFERRING:    SSN: [id number]  PROVIDER:  Raynelle Bring, M.D.    LOCATION:  [facility] - [phone]   --------------------------------------------------------------------------------   CC: Prostate Cancer  HPI: Travis Bowman is a 55 year-old male patient who is here to discuss treatment/management options for prostate cancer.  Physician requesting consult: Dr. Irine Seal.   Location of consult: [facility].   Primary care provider: Dr. Burnadette Pop.   He was noted to have a PSA of 3.68. He was noted to have a(n) abnormal digital rectal exam. His suspicious abnormality was noted at the right base of the prostate. He underwent a TRUS biopsy of the prostate on 11/28/2015. This confirmed Gleason 4+5=9 adenocarcinoma of the prostate. 5 out of 12 cores were positive for malignancy.   His past medical history is significant for diabetes, hypertension, gout, hepatitis C (s/p successful treatment), and urolithiasis.   TNM stage: cT2a N0 M0.   Gleason score: 4+5=9.   Biopsy date: 11/28/2015.   Biopsy: 5/12 cores positive -- R apex (< 5%, 3+4=7), R mid (95%, 4+3=7), R lateral mid (40%, 4+5=9), R base (95%, 4+4=8), R lateral base (90%, 4+4=8, PNI).   Prostate volume: 24.5 cc .   Organ Confined disease: 26%.   Extraprostatic extension: 72%.   Seminal vesicle involvement: 20%.   LNI: 23%.   5 year PFS (surgery): 53%.   10 year PFS (surgery): 37%.   He underwent staging studies on 12/11/15 including a CT scan of the pelvis and a bone scan that were both negative for metastatic disease.     ALLERGIES: morphine    MEDICATIONS: None   GU PSH: Cystoscopy Ureteroscopy - 10/22/2015 Remove Kidney  Stone - 10/22/2015      PSH Notes: Lithotomy, Cystoscopy With Ureteroscopy, Lithotripsy, Hand Surgery   NON-GU PSH: None   GU PMH: Prostate Cancer, Prostate cancer - 11/30/2015    NON-GU PMH: Personal history of other diseases of the circulatory system, History of hypertension - 10/22/2015 Personal history of other diseases of the musculoskeletal system and connective tissue, History of gout - 10/22/2015 Personal history of other endocrine, nutritional and metabolic disease, History of diabetes mellitus - 10/22/2015 Personal history of other infectious and parasitic diseases, History of hepatitis - 10/22/2015    FAMILY HISTORY: cerebrovascular accident (CVA) - Runs In Family sepsis - Runs In Family   SOCIAL HISTORY: None    Notes: Occupation, Married, Alcohol use, Number of children, Former smoker, Caffeine use   REVIEW OF SYSTEMS:    GU Review Male:   Patient denies frequent urination, hard to postpone urination, burning/ pain with urination, get up at night to urinate, leakage of urine, stream starts and stops, trouble starting your streams, and have to strain to urinate .  Gastrointestinal (Lower):   Patient denies diarrhea and constipation.  Gastrointestinal (Upper):   Patient denies nausea and vomiting.  Constitutional:   Patient denies fever, night sweats, weight loss, and fatigue.  Skin:   Patient denies skin rash/ lesion and itching.  Eyes:   Patient denies blurred vision and double vision.  Ears/ Nose/ Throat:   Patient denies sore throat and sinus problems.  Hematologic/Lymphatic:   Patient denies swollen glands and easy bruising.  Cardiovascular:   Patient denies leg swelling  and chest pains.  Respiratory:   Patient denies cough and shortness of breath.  Endocrine:   Patient denies excessive thirst.  Musculoskeletal:   Patient denies back pain and joint pain.  Neurological:   Patient denies headaches and dizziness.  Psychologic:   Patient denies depression and anxiety.    VITAL SIGNS: None   GU PHYSICAL EXAMINATION:    Prostate: 40 cc. He does have significant nodularity of the right base with induration extending down the right lateral aspect of the prostate toward the apex. There is no definite extra prostatic extension.   MULTI-SYSTEM PHYSICAL EXAMINATION:    Constitutional: Well-nourished. No physical deformities. Normally developed. Good grooming.  Neck: Neck symmetrical, not swollen. Normal tracheal position.  Respiratory: No labored breathing, no use of accessory muscles.   Cardiovascular: Normal temperature, normal extremity pulses, no swelling, no varicosities.  Lymphatic: No enlargement of neck, axillae, groin.  Skin: No paleness, no jaundice, no cyanosis. No lesion, no ulcer, no rash.  Neurologic / Psychiatric: Oriented to time, oriented to place, oriented to person. No depression, no anxiety, no agitation.  Gastrointestinal: No mass, no tenderness, no rigidity, non obese abdomen.  Eyes: Normal conjunctivae. Normal eyelids.  Ears, Nose, Mouth, and Throat: Left ear no scars, no lesions, no masses. Right ear no scars, no lesions, no masses. Nose no scars, no lesions, no masses. Normal hearing. Normal lips.  Musculoskeletal: Normal gait and station of head and neck.     PAST DATA REVIEWED:  Source Of History:  Patient  Lab Test Review:   PSA  Records Review:   Pathology Reports, Previous Patient Records  X-Ray Review: C.T. Pelvis: Reviewed Films.  Bone Scan: Reviewed Films.     PROCEDURES: None   ASSESSMENT:      ICD-10 Details  1 GU:   Prostate Cancer - C61    PLAN:           Document Letter(s):  Created for Patient: Clinical Summary    The patient was counseled about the natural history of prostate cancer and the standard treatment options that are available for prostate cancer. It was explained to him how his age and life expectancy, clinical stage, Gleason score, and PSA affect his prognosis, the decision to proceed with  additional staging studies, as well as how that information influences recommended treatment strategies. We discussed the roles for active surveillance, radiation therapy, surgical therapy, androgen deprivation, as well as ablative therapy options for the treatment of prostate cancer as appropriate to his individual cancer situation. We discussed the risks and benefits of these options with regard to their impact on cancer control and also in terms of potential adverse events, complications, and impact on quality of life particularly related to urinary and sexual function. The patient was encouraged to ask questions throughout the discussion today and all questions were answered to his stated satisfaction. In addition, the patient was provided with and/or directed to appropriate resources and literature for further education about prostate cancer and treatment options.        We discussed surgical therapy for prostate cancer including the different available surgical approaches. We discussed, in detail, the risks and expectations of surgery with regard to cancer control, urinary control, and erectile function as well as the expected postoperative recovery process. Additional risks of surgery including but not limited to bleeding, infection, hernia formation, nerve damage, lymphocele formation, bowel/rectal injury potentially necessitating colostomy, damage to the urinary tract resulting in urine leakage, urethral stricture, and the cardiopulmonary risks such  as myocardial infarction, stroke, death, venothromboembolism, etc. were explained. The risk of open surgical conversion for robotic/laparoscopic prostatectomy was also discussed.           Notes:   1. Prostate cancer: After an extensive discussion with Mr. Fondren and his wife regarding treatment options, he has elected to proceed with primary surgical therapy. We discussed the likelihood that he will require multimodality therapy considering his  high-risk disease and he is prepared to proceed with adjuvant radiation therapy if necessary. He is scheduled to meet with Dr. Tammi Klippel later today.   My plan is to proceed with a unilateral left nerve sparing robot-assisted laparoscopic radical prostatectomy and pelvic lymphadenectomy.   Cc: Dr. Tyler Pita  Dr. Zola Button  Dr. Irine Seal  Dr. Burnadette Pop

## 2015-12-25 NOTE — Progress Notes (Signed)
Radiation Oncology         [phone] ________________________________  Initial Outpatient Consultation: [facility]  Name: Travis Bowman MRN: [id number]  Date: 12/25/2015  DOB: 04/28/1961  [id number], MD  Travis Bring, MD   REFERRING PHYSICIAN: Raynelle Bring, MD   DIAGNOSIS: 55 year old gentleman with a T2B adenocarcinoma of the prostate with a PSA of 3.68 and a max Gleason score of 4+5.     ICD-9-CM ICD-10-CM   1. Malignant neoplasm of prostate (HCC) 185 C61     HISTORY OF PRESENT ILLNESS: Travis Bowman is a 55 y.o. male seen at the request of Dr. Roni Bowman for a new diagnosis of prostate cancer. The patient was found to have an elevated PSA by his primary provider in February 2017 this was 4.59, and repeat on 10/15/2015 3.68. He was sent for evaluation with Dr. Jerlyn Bowman, and has been noted patient to practice because of previous renal calculi. He has had multiple procedures to remove these. During his examination he was found to have a nodule on the right lobe, and was encouraged to undergo biopsy. On 11/28/2015 he underwent transrectal ultrasound revealing a 27.47 mL and out of 12 cores, 5 revealed malignancy, with a maximum Gleason score of 4+5 in the right midlateral gland, 4+4 in the right base lateral and right base, a 4+3 in the right mid gland, and a 3+4 in the right apex. He did have high volume disease in the high risk locations. He did undergo a bone scan on 12/11/2015 revealed no evidence of metastatic disease. He comes today for further recommendations of care in the multidisciplinary clinic and specifically to meet with Dr. Tammi Klippel for discussion of the role of radiotherapy.     PREVIOUS RADIATION THERAPY: No  PAST MEDICAL HISTORY:  Past Medical History  Diagnosis Date  . ED (erectile dysfunction)   . Type 2 diabetes mellitus ([place])   . History of nephrolithiasis   . 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 Right knee meniscal tear   . GERD (gastroesophageal reflux disease)   . Malignant essential hypertension   . History of CHF (congestive heart failure)     mild acute episode 12-05-2013 in setting of HTN crisis--  resolved  . 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 Wears contact lenses   . At risk for sleep apnea     STOP-BANG= 7   SENT TO PCP 02-02-2015  . Prostate cancer [facility])       PAST SURGICAL HISTORY: Past Surgical History  Procedure Laterality Date  . Orchiectomy Right age 67  . Transthoracic echocardiogram  12-06-2013    mild LVH,  ef 50-55%,  mild LAE and RAE,  mild dilated aortic root  . Tonsillectomy and adenoidectomy  as child  . Trigger finger release Bilateral last one 2013  . Cataract extraction w/ intraocular lens  implant, bilateral  2003/  2006  . Extracorporeal shock wave lithotripsy  x5  last one 2006  . Percutaneous nephrostolithotomy  x2  last one 1980's  . Ureterolithotomy  1990's  . Knee arthroscopy with medial menisectomy Right 02/08/2015    Procedure: RIGHT KNEE ARTHROSCOPY, PARTIAL MEDIAL MENISCECTOMY, CHRONDROPLASTY;  Surgeon: Travis Cabal, MD;  Location:  [place];  Service: Orthopedics;  Laterality: Right;  . Prostate biopsy      FAMILY HISTORY:  Family History  Problem Relation Age of Onset  . Stroke Mother   . CAD Mother   . Alcohol abuse Father   . Diabetes Brother   . Marfan syndrome Brother     SOCIAL HISTORY:  Social History   Social History  . Marital Status: Married    Spouse Name: N/A  . Number of Children: 3  . Years of Education: 12   Occupational History  . 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 Smoking status: Current Some Day Smoker -- 1.00 packs/day    Types: Cigars    Last Attempt to Quit: 08/07/2005  . Smokeless  tobacco: Never Used     Comment: currently occasoinal cigar--  quit cigarettes in 2007 had smoked for 25 yrs  . Alcohol Use: 1.8 oz/week    3 Cans of beer per week     Comment: occasional drink  . Drug Use: No  . Sexual Activity: Yes   Other Topics Concern  . Not on file   Social History Narrative  The patient is married and resides in [place]. He works as a Sport and exercise psychologist.  ALLERGIES: Morphine and related  MEDICATIONS:  Current Outpatient Prescriptions  Medication Sig Dispense Refill  . aspirin EC 325 MG tablet Take 1 tablet (325 mg total) by mouth 2 (two) times daily. 30 tablet 0  . cephALEXin (KEFLEX) 500 MG capsule Take 1 capsule (500 mg total) by mouth 3 (three) times daily. 12 capsule 0  . chlorthalidone (HYGROTON) 25 MG tablet Take 1 tablet by mouth every morning.   12  . glimepiride (AMARYL) 1 MG tablet Take 1 tablet by mouth daily with breakfast.   5  . HYDROcodone-acetaminophen (NORCO) 5-325 MG per tablet Take 1-2 tablets by mouth every 4 (four) hours as needed for moderate pain. 60 tablet 0  . lisinopril (PRINIVIL,ZESTRIL) 40 MG tablet Take 1 tablet by mouth every evening.   3  . omeprazole (PRILOSEC) 40 MG capsule Take 40 mg by mouth every morning.    [name] VIAGRA 100 MG tablet as needed.  5   No current facility-administered medications for this encounter.    REVIEW OF SYSTEMS:  On review of systems, the patient reports that he is doing well overall. He denies any chest pain, shortness of breath, cough, fevers, chills, night sweats, unintended weight changes. He denies any bowel disturbances, and denies abdominal pain, nausea or vomiting. He reports that his urinary habits include normal voiding with the exception of occasional weak stream, and his IPSS score is 9. He reports some erectile dysfunction, and is able to complete sexual activity with the aid of 50 mg with Viagra. He denies any new musculoskeletal or joint aches or pains. A complete review of systems is  obtained and is otherwise negative.    PHYSICAL EXAM:   weight is 250 lb 1.6 oz (113.445 kg). His oral temperature is 98.4 F (36.9 C). His blood pressure is 149/82 and his pulse is 76. His respiration is 16 and oxygen saturation is 100%.   Pain Scale 0/10 In general this is a well appearing Caucasian male in no acute distress. He is alert and oriented x4 and appropriate throughout the examination. HEENT reveals that the patient is normocephalic, atraumatic. EOMs are intact. PERRLA. Skin is intact without any evidence of gross lesions. Cardiovascular exam reveals a regular  rate and rhythm, no clicks rubs or murmurs are auscultated. Chest is clear to auscultation bilaterally. Lymphatic assessment is performed and does not reveal any adenopathy in the cervical, supraclavicular, axillary, or inguinal chains. Abdomen has active bowel sounds in all quadrants and is intact. The abdomen is soft, non tender, non distended. Lower extremities are negative for pretibial pitting edema, deep calf tenderness, cyanosis or clubbing. Patient denies blood in urine or urinary pain. Patient also denies pain in bowels or abdomen. Patient denies vomiting and nausea.    KPS = 100  100 - Normal; no complaints; no evidence of disease. 90   - Able to carry on normal activity; minor signs or symptoms of disease. 80   - Normal activity with effort; some signs or symptoms of disease. 36   - Cares for self; unable to carry on normal activity or to do active work. 60   - Requires occasional assistance, but is able to care for most of his personal needs. 50   - Requires considerable assistance and frequent medical care. 4   - Disabled; requires special care and assistance. 29   - Severely disabled; hospital admission is indicated although death not imminent. 72   - Very sick; hospital admission necessary; active supportive treatment necessary. 10   - Moribund; fatal processes progressing rapidly. 0     - Dead  Karnofsky  DA, Abelmann Sharpes, Craver LS and Burchenal [facility] [phone]) The use of the nitrogen mustards in the palliative treatment of carcinoma: with particular reference to bronchogenic carcinoma Cancer 1 634-56  LABORATORY DATA:  Lab Results  Component Value Date   WBC 5.2 12/08/2013   HGB 16.7 02/08/2015   HCT 49.0 02/08/2015   MCV 88.6 12/08/2013   PLT 164 12/08/2013   Lab Results  Component Value Date   NA 138 02/08/2015   K 3.8 02/08/2015   CL 102 12/09/2013   CO2 21 12/09/2013   Lab Results  Component Value Date   ALT 14 12/06/2013   AST 20 12/06/2013   ALKPHOS 36* 12/06/2013   BILITOT 0.5 12/06/2013     RADIOGRAPHY: Nm Bone Scan Whole Body  12/11/2015  CLINICAL DATA:  Newly diagnosed prostate carcinoma with elevated PSA EXAM: NUCLEAR MEDICINE WHOLE BODY BONE SCAN TECHNIQUE: Whole body anterior and posterior images were obtained approximately 3 hours after intravenous injection of radiopharmaceutical. RADIOPHARMACEUTICALS:  26.6 mCi Technetium-42m medronate IV COMPARISON:  None. FINDINGS: There is adequate uptake of radioactive tracer throughout the bony skeleton. Bilateral renal activity is noted. Some degenerative changes are noted about the knee joints and shoulder joints. No focal area of increased activity is identified to suggest metastatic disease. IMPRESSION: No evidence of metastatic disease. Electronically Signed   By: Inez Catalina M.D.   On: 12/11/2015 14:48      IMPRESSION: 55 y.o. male with a high risk T2B, adenocarcinoma of the prostate with a Gleason's Score of 4+5 and a PSA of 3.68.  PLAN: Dr. Tammi Klippel discusses the workup thus far including biopsies, imaging studies, and PSA. Dr. Tammi Klippel discusses the options for prostatectomy versus radiotherapy and details the options of each treatment including risks, benefits, short and long-term effects of each modality of treatment. Patient is leaning toward surgical treatment. Dr. Tammi Klippel discusses the options for salvage radiotherapy  should disease to be present at the time of the procedure. The patient states agreement and understanding and is encouraged if any questions or concerns regarding her discussion and a plexiform narrative.   The above documentation  reflects my direct findings during this shared patient visit. Please see the separate note by Dr. Tammi Klippel on this date for the remainder of the patient's plan of care.   Carola Rhine, PAC     This document serves as a record of services personally performed by Tyler Pita, MD. It was created on his behalf by Truddie Hidden, a trained medical scribe. The creation of this record is based on the scribe's personal observations and the provider's statements to them. This document has been checked and approved by the attending provider.

## 2015-12-26 ENCOUNTER — Other Ambulatory Visit: Payer: Self-pay | Admitting: Urology

## 2015-12-26 ENCOUNTER — Encounter: Payer: Self-pay | Admitting: General Practice

## 2015-12-26 NOTE — Progress Notes (Signed)
[place] Psychosocial Distress Screening Spiritual Care  Met with pt and wife Lovena Le at [facility] to introduce [facility] team/resources, reviewing distress screen per protocol.  The patient scored a 5 on the Psychosocial Distress Thermometer which indicates moderate distress. Also assessed for distress and other psychosocial needs.   ONCBCN DISTRESS SCREENING 12/26/2015  Screening Type Initial Screening  Distress experienced in past week (1-10) 5  Emotional problem type Adjusting to illness  Information Concerns Type Lack of info about diagnosis;Lack of info about treatment  Physical Problem type Changes in urination  Referral to support programs Yes  Other Spiritual Care   Per pt, fear of the unknown was his biggest stressor; resolving information concerns and receiving comprehensive care at [facility] helped reduce this distress.  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  Follow up needed: No.  Couple is aware of ongoing Support Team/programming availability, but please also page if needs arise/circumstances change.  Thank you.  [place], [place], [facility] Pager [phone] Voicemail [phone]

## 2016-01-14 ENCOUNTER — Telehealth: Payer: Self-pay | Admitting: Medical Oncology

## 2016-01-14 NOTE — Telephone Encounter (Signed)
Oncology Nurse Navigator Documentation  Oncology Nurse Navigator Flowsheets 12/24/2015 12/25/2015 01/14/2016  Navigator Location - - -  Navigator Encounter Type Telephone Clinic/MDC Telephone;MDC Follow-up  Telephone- Outgoing Call;Appt Confirmation/Clarification - Outgoing Call;Clinic/MDC Follow-up- Left message following up post Prostate MDC. I asked him to call me with any questions or concerns. He is scheduled for surgery 7/31.  Abnormal Finding Date - - -  Confirmed Diagnosis Date - - -  Patient Visit Type - Initial -  Barriers/Navigation Needs No barriers at this time Education -  Education - Occupational psychologist Treatment Options -  Interventions - Education Method -  Education Method - Teach-back;Verbal;Written -  Support Groups/Services Friends and Family Friends and Family -  Acuity - - -  Acuity Level 1 - - -  Time Spent with Patient 15 > 120 15

## 2016-01-22 ENCOUNTER — Encounter (HOSPITAL_COMMUNITY): Payer: Self-pay

## 2016-01-22 NOTE — Progress Notes (Signed)
eccho 9/15 epic

## 2016-01-22 NOTE — Patient Instructions (Signed)
Zackarey Kienle Raineri  01/22/2016   Your procedure is scheduled on: 01/28/16  Report to [facility] Main  Entrance take [place]  elevators to 3rd floor to  [place] at 0930 AM.  Call this number if you have problems the morning of surgery [phone]   Remember: ONLY 1 PERSON MAY GO WITH YOU TO SHORT STAY TO GET  READY MORNING OF [place].  Do not  Drink any  liquids :After Midnight.   BOWEL PREP AS PER OFFICE  Take these medicines the morning of surgery with A SIP OF WATER: Omeprazole DO NOT TAKE ANY DIABETIC MEDICATIONS DAY OF YOUR SURGERY                               You may not have any metal on your body including hair pins and              piercings  Do not wear jewelry, make-up, lotions, powders or perfumes, deodorant             Do not wear nail polish.  Do not shave  48 hours prior to surgery.              Men may shave face and neck.   Do not bring valuables to the hospital. [place].  Contacts, dentures or bridgework may not be worn into surgery.  Leave suitcase in the car. After surgery it may be brought to your room.            [place] - Preparing for Surgery Before surgery, you can play an important role.  Because skin is not sterile, your skin needs to be as free of germs as possible.  You can reduce the number of germs on your skin by washing with CHG (chlorahexidine gluconate) soap before surgery.  CHG is an antiseptic cleaner which kills germs and bonds with the skin to continue killing germs even after washing. Please DO NOT use if you have an allergy to CHG or antibacterial soaps.  If your skin becomes reddened/irritated stop using the CHG and inform your nurse when you arrive at Short Stay. Do not shave (including legs and underarms) for at least 48 hours prior to the first CHG shower.  You may shave your face/neck. Please follow these instructions carefully:  1.  Shower with CHG  Soap the night before surgery and the  morning of Surgery.  2.  If you choose to wash your hair, wash your hair first as usual with your  normal  shampoo.  3.  After you shampoo, rinse your hair and body thoroughly to remove the  shampoo.                           4.  Use CHG as you would any other liquid soap.  You can apply chg directly  to the skin and wash                       Gently with a scrungie or clean washcloth.  5.  Apply the CHG Soap to your body ONLY FROM THE NECK DOWN.   Do not use on face/ open  Wound or open sores. Avoid contact with eyes, ears mouth and genitals (private parts).                       Wash face,  Genitals (private parts) with your normal soap.             6.  Wash thoroughly, paying special attention to the area where your surgery  will be performed.  7.  Thoroughly rinse your body with warm water from the neck down.  8.  DO NOT shower/wash with your normal soap after using and rinsing off  the CHG Soap.                9.  Pat yourself dry with a clean towel.            10.  Wear clean pajamas.            11.  Place clean sheets on your bed the night of your first shower and do not  sleep with pets. Day of Surgery : Do not apply any lotions/deodorants the morning of surgery.  Please wear clean clothes to the hospital/surgery center.  FAILURE TO FOLLOW THESE INSTRUCTIONS MAY RESULT IN THE CANCELLATION OF YOUR SURGERY PATIENT SIGNATURE_________________________________  NURSE SIGNATURE__________________________________  ________________________________________________________________________  WHAT IS A BLOOD TRANSFUSION? Blood Transfusion Information  A transfusion is the replacement of blood or some of its parts. Blood is made up of multiple cells which provide different functions.  Red blood cells carry oxygen and are used for blood loss replacement.  White blood cells fight against infection.  Platelets control  bleeding.  Plasma helps clot blood.  Other blood products are available for specialized needs, such as hemophilia or other clotting disorders. BEFORE THE TRANSFUSION  Who gives blood for transfusions?   Healthy volunteers who are fully evaluated to make sure their blood is safe. This is blood bank blood. Transfusion therapy is the safest it has ever been in the practice of medicine. Before blood is taken from a donor, a complete history is taken to make sure that person has no history of diseases nor engages in risky social behavior (examples are intravenous drug use or sexual activity with multiple partners). The donor's travel history is screened to minimize risk of transmitting infections, such as malaria. The donated blood is tested for signs of infectious diseases, such as HIV and hepatitis. The blood is then tested to be sure it is compatible with you in order to minimize the chance of a transfusion reaction. If you or a relative donates blood, this is often done in anticipation of surgery and is not appropriate for emergency situations. It takes many days to process the donated blood. RISKS AND COMPLICATIONS Although transfusion therapy is very safe and saves many lives, the main dangers of transfusion include:   Getting an infectious disease.  Developing a transfusion reaction. This is an allergic reaction to something in the blood you were given. Every precaution is taken to prevent this. The decision to have a blood transfusion has been considered carefully by your caregiver before blood is given. Blood is not given unless the benefits outweigh the risks. AFTER THE TRANSFUSION  Right after receiving a blood transfusion, you will usually feel much better and more energetic. This is especially true if your red blood cells have gotten low (anemic). The transfusion raises the level of the red blood cells which carry oxygen, and this usually causes an energy increase.  The  nurse  administering the transfusion will monitor you carefully for complications. HOME CARE INSTRUCTIONS  No special instructions are needed after a transfusion. You may find your energy is better. Speak with your caregiver about any limitations on activity for underlying diseases you may have. SEEK MEDICAL CARE IF:   Your condition is not improving after your transfusion.  You develop redness or irritation at the intravenous (IV) site. SEEK IMMEDIATE MEDICAL CARE IF:  Any of the following symptoms occur over the next 12 hours:  Shaking chills.  You have a temperature by mouth above 102 F (38.9 C), not controlled by medicine.  Chest, back, or muscle pain.  People around you feel you are not acting correctly or are confused.  Shortness of breath or difficulty breathing.  Dizziness and fainting.  You get a rash or develop hives.  You have a decrease in urine output.  Your urine turns a dark color or changes to pink, red, or brown. Any of the following symptoms occur over the next 10 days:  You have a temperature by mouth above 102 F (38.9 C), not controlled by medicine.  Shortness of breath.  Weakness after normal activity.  The white part of the eye turns yellow (jaundice).  You have a decrease in the amount of urine or are urinating less often.  Your urine turns a dark color or changes to pink, red, or brown. Document Released: 06/13/2000 Document Revised: 09/08/2011 Document Reviewed: 01/31/2008 ExitCare Patient Information 2014 ExitCare, [place].  _______________________________________________________________________   CLEAR LIQUID DIET   Foods Allowed                                                                     Foods Excluded  Coffee and tea, regular and decaf                             liquids that you cannot  Plain Jell-O in any flavor                                             see through such as: Fruit ices (not with fruit pulp)                                      milk, soups, orange juice  Iced Popsicles                                    All solid food Carbonated beverages, regular and diet                                    Cranberry, grape and apple juices Sports drinks like Gatorade Lightly seasoned clear broth or consume(fat free) Sugar, honey syrup  Sample Menu Breakfast  Lunch                                     Supper Cranberry juice                    Beef broth                            Chicken broth Jell-O                                     Grape juice                           Apple juice Coffee or tea                        Jell-O                                      Popsicle                                                Coffee or tea                        Coffee or tea  _____________________________________________________________________

## 2016-01-24 ENCOUNTER — Encounter (HOSPITAL_COMMUNITY): Payer: Self-pay

## 2016-01-24 ENCOUNTER — Ambulatory Visit (HOSPITAL_COMMUNITY)
Admission: RE | Admit: 2016-01-24 | Discharge: 2016-01-24 | Disposition: A | Discharge status: Home / Self Care | Payer: Commercial Managed Care - HMO | Source: Ambulatory Visit | Attending: Urology | Admitting: Urology

## 2016-01-24 ENCOUNTER — Encounter (INDEPENDENT_AMBULATORY_CARE_PROVIDER_SITE_OTHER): Payer: Self-pay

## 2016-01-24 ENCOUNTER — Encounter (HOSPITAL_COMMUNITY)
Admission: RE | Admit: 2016-01-24 | Discharge: 2016-01-24 | Disposition: A | Discharge status: Home / Self Care | Payer: Commercial Managed Care - HMO | Source: Ambulatory Visit | Attending: Urology | Admitting: Urology

## 2016-01-24 DIAGNOSIS — R918 Other nonspecific abnormal finding of lung field: Secondary | ICD-10-CM | POA: Diagnosis not present

## 2016-01-24 DIAGNOSIS — E119 Type 2 diabetes mellitus without complications: Secondary | ICD-10-CM | POA: Insufficient documentation

## 2016-01-24 DIAGNOSIS — C61 Malignant neoplasm of prostate: Secondary | ICD-10-CM | POA: Insufficient documentation

## 2016-01-24 DIAGNOSIS — Z87891 Personal history of nicotine dependence: Secondary | ICD-10-CM | POA: Diagnosis not present

## 2016-01-24 HISTORY — DX: Personal history of other medical treatment: Z92.89

## 2016-01-24 LAB — COMPREHENSIVE METABOLIC PANEL
ALT: 39 U/L (ref 17–63)
AST: 31 U/L (ref 15–41)
Albumin: 4.3 g/dL (ref 3.5–5.0)
Alkaline Phosphatase: 47 U/L (ref 38–126)
Anion gap: 6 (ref 5–15)
BUN: 20 mg/dL (ref 6–20)
CO2: 29 mmol/L (ref 22–32)
Calcium: 9.2 mg/dL (ref 8.9–10.3)
Chloride: 104 mmol/L (ref 101–111)
Creatinine, Ser: 1.12 mg/dL (ref 0.61–1.24)
GFR calc Af Amer: >60 mL/min (ref >60)
GFR calc non Af Amer: >60 mL/min (ref >60)
Glucose, Bld: 271 mg/dL — ABNORMAL HIGH (ref 65–99)
Potassium: 4.5 mmol/L (ref 3.5–5.1)
Sodium: 139 mmol/L (ref 135–145)
Total Bilirubin: 0.6 mg/dL (ref 0.3–1.2)
Total Protein: 7.1 g/dL (ref 6.5–8.1)

## 2016-01-24 LAB — ABO/RH: ABO/RH(D): A POS

## 2016-01-24 LAB — CBC
HCT: 44.9 % (ref 39.0–52.0)
Hemoglobin: 15.7 g/dL (ref 13.0–17.0)
MCH: 31 pg (ref 26.0–34.0)
MCHC: 35 g/dL (ref 30.0–36.0)
MCV: 88.6 fL (ref 78.0–100.0)
Platelets: 168 10*3/uL (ref 150–400)
RBC: 5.07 MIL/uL (ref 4.22–5.81)
RDW: 13.3 % (ref 11.5–15.5)
WBC: 8.7 10*3/uL (ref 4.0–10.5)

## 2016-01-24 NOTE — Progress Notes (Signed)
eccho 9/15 epic

## 2016-01-24 NOTE — Progress Notes (Signed)
   01/24/16 0947  OBSTRUCTIVE SLEEP APNEA  Score 5 or greater  (score 7 for STOP BANG)

## 2016-01-25 LAB — HEMOGLOBIN A1C
Hgb A1c MFr Bld: 7.7 % — ABNORMAL HIGH (ref 4.8–5.6)
Mean Plasma Glucose: 174 mg/dL

## 2016-01-25 NOTE — H&P (Signed)
CC: Prostate Cancer   HPI: Travis Bowman is a 55 year-old male patient who is here to discuss treatment/management options for prostate cancer.   He was noted to have a PSA of 3.68. He was noted to have a(n) abnormal digital rectal exam. His suspicious abnormality was noted at the right base of the prostate. He underwent a TRUS biopsy of the prostate on 11/28/2015. This confirmed Gleason 4+5=9 adenocarcinoma of the prostate. 5 out of 12 cores were positive for malignancy.   His past medical history is significant for diabetes, hypertension, gout, hepatitis C (s/p successful treatment), and urolithiasis.   TNM stage: cT2a N0 M0.   Gleason score: 4+5=9.   Biopsy date: 11/28/2015.   Biopsy: 5/12 cores positive -- R apex (< 5%, 3+4=7), R mid (95%, 4+3=7), R lateral mid (40%, 4+5=9), R base (95%, 4+4=8), R lateral base (90%, 4+4=8, PNI).   Prostate volume: 24.5 cc .  Organ Confined disease: 26%.  Extraprostatic extension: 72%.  Seminal vesicle involvement: 20%.  LNI: 23%.  5 year PFS (surgery): 53%.  10 year PFS (surgery): 37%.   He underwent staging studies on 12/11/15 including a CT scan of the pelvis and a bone scan that were both negative for metastatic disease.   IPSS: 9  SHIM score: 24 (Viagra 50 mg)     ALLERGIES: morphine    MEDICATIONS: None   GU PSH: Cystoscopy Ureteroscopy - 10/22/2015 Remove Kidney Stone - 10/22/2015      PSH Notes: Lithotomy, Cystoscopy With Ureteroscopy, Lithotripsy, Hand Surgery   NON-GU PSH: None   GU PMH: Prostate Cancer, Prostate cancer - 11/30/2015    NON-GU PMH: Personal history of other diseases of the circulatory system, History of hypertension - 10/22/2015 Personal history of other diseases of the musculoskeletal system and connective tissue, History of gout - 10/22/2015 Personal history of other endocrine, nutritional and metabolic disease, History of diabetes mellitus - 10/22/2015 Personal history of other infectious and parasitic  diseases, History of hepatitis - 10/22/2015    FAMILY HISTORY: cerebrovascular accident (CVA) - Runs In Family sepsis - Runs In Family   SOCIAL HISTORY: None    Notes: Occupation, Married, Alcohol use, Number of children, Former smoker, Caffeine use   REVIEW OF SYSTEMS:    GU Review Male:   Patient denies frequent urination, hard to postpone urination, burning/ pain with urination, get up at night to urinate, leakage of urine, stream starts and stops, trouble starting your streams, and have to strain to urinate .  Gastrointestinal (Lower):   Patient denies diarrhea and constipation.  Gastrointestinal (Upper):   Patient denies nausea and vomiting.  Constitutional:   Patient denies fever, night sweats, weight loss, and fatigue.  Skin:   Patient denies skin rash/ lesion and itching.  Eyes:   Patient denies blurred vision and double vision.  Ears/ Nose/ Throat:   Patient denies sore throat and sinus problems.  Hematologic/Lymphatic:   Patient denies swollen glands and easy bruising.  Cardiovascular:   Patient denies leg swelling and chest pains.  Respiratory:   Patient denies cough and shortness of breath.  Endocrine:   Patient denies excessive thirst.  Musculoskeletal:   Patient denies back pain and joint pain.  Neurological:   Patient denies headaches and dizziness.  Psychologic:   Patient denies depression and anxiety.     MULTI-SYSTEM PHYSICAL EXAMINATION:    Constitutional: Well-nourished. No physical deformities. Normally developed. Good grooming.  Neck: Neck symmetrical, not swollen. Normal tracheal position.  Respiratory: No labored breathing,  no use of accessory muscles.   Cardiovascular: Normal temperature, normal extremity pulses, no swelling, no varicosities.  Lymphatic: No enlargement of neck, axillae, groin.  Skin: No paleness, no jaundice, no cyanosis. No lesion, no ulcer, no rash.  Neurologic / Psychiatric: Oriented to time, oriented to place, oriented to person. No  depression, no anxiety, no agitation.  Gastrointestinal: No mass, no tenderness, no rigidity, non obese abdomen.  Eyes: Normal conjunctivae. Normal eyelids.  Ears, Nose, Mouth, and Throat: Left ear no scars, no lesions, no masses. Right ear no scars, no lesions, no masses. Nose no scars, no lesions, no masses. Normal hearing. Normal lips.  Musculoskeletal: Normal gait and station of head and neck.       ASSESSMENT:     1 GU:   Prostate Cancer - C61    PLAN:        1. Prostate cancer: After an extensive discussion with [name] and his wife regarding treatment options, he has elected to proceed with primary surgical therapy. He will undergo a unilateral left nerve sparing robot-assisted laparoscopic radical prostatectomy and pelvic lymphadenectomy.

## 2016-01-28 ENCOUNTER — Inpatient Hospital Stay (HOSPITAL_COMMUNITY): Payer: Commercial Managed Care - HMO | Admitting: Anesthesiology

## 2016-01-28 ENCOUNTER — Encounter: Payer: Self-pay | Admitting: Medical Oncology

## 2016-01-28 ENCOUNTER — Inpatient Hospital Stay (HOSPITAL_COMMUNITY)
Admission: RE | Admit: 2016-01-28 | Discharge: 2016-01-29 | DRG: 708 | Disposition: A | Discharge status: Home / Self Care | Payer: Commercial Managed Care - HMO | Source: Ambulatory Visit | Attending: Urology | Admitting: Urology

## 2016-01-28 ENCOUNTER — Encounter (HOSPITAL_COMMUNITY)
Admission: RE | Disposition: A | Discharge status: Home / Self Care | Payer: Self-pay | Source: Ambulatory Visit | Attending: Urology

## 2016-01-28 ENCOUNTER — Encounter (HOSPITAL_COMMUNITY): Payer: Self-pay | Admitting: *Deleted

## 2016-01-28 DIAGNOSIS — I10 Essential (primary) hypertension: Secondary | ICD-10-CM | POA: Diagnosis present

## 2016-01-28 DIAGNOSIS — G4730 Sleep apnea, unspecified: Secondary | ICD-10-CM | POA: Diagnosis present

## 2016-01-28 DIAGNOSIS — C61 Malignant neoplasm of prostate: Secondary | ICD-10-CM | POA: Diagnosis present

## 2016-01-28 DIAGNOSIS — Z87891 Personal history of nicotine dependence: Secondary | ICD-10-CM

## 2016-01-28 DIAGNOSIS — K219 Gastro-esophageal reflux disease without esophagitis: Secondary | ICD-10-CM | POA: Diagnosis present

## 2016-01-28 DIAGNOSIS — E119 Type 2 diabetes mellitus without complications: Secondary | ICD-10-CM | POA: Diagnosis present

## 2016-01-28 DIAGNOSIS — Z823 Family history of stroke: Secondary | ICD-10-CM | POA: Diagnosis not present

## 2016-01-28 HISTORY — PX: LYMPHADENECTOMY: SHX5960

## 2016-01-28 HISTORY — PX: ROBOT ASSISTED LAPAROSCOPIC RADICAL PROSTATECTOMY: SHX5141

## 2016-01-28 LAB — BASIC METABOLIC PANEL
Anion gap: 12 (ref 5–15)
BUN: 24 mg/dL — ABNORMAL HIGH (ref 6–20)
CO2: 22 mmol/L (ref 22–32)
Calcium: 8.7 mg/dL — ABNORMAL LOW (ref 8.9–10.3)
Chloride: 102 mmol/L (ref 101–111)
Creatinine, Ser: 1.33 mg/dL — ABNORMAL HIGH (ref 0.61–1.24)
GFR calc Af Amer: >60 mL/min (ref >60)
GFR calc non Af Amer: 59 mL/min — ABNORMAL LOW (ref >60)
Glucose, Bld: 261 mg/dL — ABNORMAL HIGH (ref 65–99)
Potassium: 4.1 mmol/L (ref 3.5–5.1)
Sodium: 136 mmol/L (ref 135–145)

## 2016-01-28 LAB — TYPE AND SCREEN
ABO/RH(D): A POS
Antibody Screen: NEGATIVE

## 2016-01-28 LAB — GLUCOSE, CAPILLARY
Glucose-Capillary: 193 mg/dL — ABNORMAL HIGH (ref 65–99)
Glucose-Capillary: 238 mg/dL — ABNORMAL HIGH (ref 65–99)
Glucose-Capillary: 295 mg/dL — ABNORMAL HIGH (ref 65–99)
Glucose-Capillary: 240 mg/dL — ABNORMAL HIGH (ref 65–99)

## 2016-01-28 LAB — HEMOGLOBIN AND HEMATOCRIT, BLOOD
HCT: 43.8 % (ref 39.0–52.0)
Hemoglobin: 15.3 g/dL (ref 13.0–17.0)

## 2016-01-28 SURGERY — XI ROBOTIC ASSISTED LAPAROSCOPIC RADICAL PROSTATECTOMY LEVEL 2
Anesthesia: General

## 2016-01-28 MED ORDER — MIDAZOLAM HCL 2 MG/2ML IJ SOLN
INTRAMUSCULAR | Status: DC | PRN
  Administered 2016-01-28: 2 mg via INTRAVENOUS

## 2016-01-28 MED ORDER — ROCURONIUM BROMIDE 100 MG/10ML IV SOLN
INTRAVENOUS | Status: AC
  Filled 2016-01-28: qty 1

## 2016-01-28 MED ORDER — ONDANSETRON HCL 4 MG/2ML IJ SOLN
INTRAMUSCULAR | Status: DC | PRN
  Administered 2016-01-28: 4 mg via INTRAVENOUS

## 2016-01-28 MED ORDER — HYDROMORPHONE HCL 1 MG/ML IJ SOLN
INTRAMUSCULAR | Status: DC | PRN
  Administered 2016-01-28 (×5): .4 mg via INTRAVENOUS

## 2016-01-28 MED ORDER — BUPIVACAINE-EPINEPHRINE 0.25% -1:200000 IJ SOLN
INTRAMUSCULAR | Status: AC
  Filled 2016-01-28: qty 1

## 2016-01-28 MED ORDER — CEFAZOLIN SODIUM-DEXTROSE 2-4 GM/100ML-% IV SOLN
2.0000 g | INTRAVENOUS | Status: AC
  Administered 2016-01-28: 2 g via INTRAVENOUS

## 2016-01-28 MED ORDER — HYDRALAZINE HCL 20 MG/ML IJ SOLN
INTRAMUSCULAR | Status: AC
  Filled 2016-01-28: qty 1

## 2016-01-28 MED ORDER — SODIUM CHLORIDE 0.9 % IV BOLUS (SEPSIS)
1000.0000 mL | Freq: Once | INTRAVENOUS | Status: AC
  Administered 2016-01-28: 1000 mL via INTRAVENOUS

## 2016-01-28 MED ORDER — INSULIN ASPART 100 UNIT/ML ~~LOC~~ SOLN
SUBCUTANEOUS | Status: AC
  Filled 2016-01-28: qty 1

## 2016-01-28 MED ORDER — ACETAMINOPHEN 325 MG PO TABS: 650.0000 mg | ORAL_TABLET | ORAL | Status: DC | PRN

## 2016-01-28 MED ORDER — PANTOPRAZOLE SODIUM 40 MG PO TBEC
40.0000 mg | DELAYED_RELEASE_TABLET | Freq: Every day | ORAL | Status: DC
  Administered 2016-01-29: 40 mg via ORAL
  Filled 2016-01-28: qty 1

## 2016-01-28 MED ORDER — SUGAMMADEX SODIUM 500 MG/5ML IV SOLN
INTRAVENOUS | Status: AC
  Filled 2016-01-28: qty 5

## 2016-01-28 MED ORDER — PROPOFOL 10 MG/ML IV BOLUS
INTRAVENOUS | Status: AC
  Filled 2016-01-28: qty 20

## 2016-01-28 MED ORDER — KETOROLAC TROMETHAMINE 15 MG/ML IJ SOLN
INTRAMUSCULAR | Status: AC
  Filled 2016-01-28: qty 1

## 2016-01-28 MED ORDER — DOCUSATE SODIUM 100 MG PO CAPS
100.0000 mg | ORAL_CAPSULE | Freq: Two times a day (BID) | ORAL | Status: DC
  Administered 2016-01-28 – 2016-01-29 (×2): 100 mg via ORAL
  Filled 2016-01-28 (×2): qty 1

## 2016-01-28 MED ORDER — SODIUM CHLORIDE 0.9 % IV SOLN
INTRAVENOUS | Status: DC
  Administered 2016-01-28: 11:00:00 via INTRAVENOUS

## 2016-01-28 MED ORDER — MAGNESIUM CITRATE PO SOLN
1.0000 | Freq: Once | ORAL | Status: DC
  Filled 2016-01-28: qty 296

## 2016-01-28 MED ORDER — HEPARIN SODIUM (PORCINE) 1000 UNIT/ML IJ SOLN
INTRAMUSCULAR | Status: AC
Start: 2016-01-28 — End: 2016-01-28
  Filled 2016-01-28: qty 1

## 2016-01-28 MED ORDER — HYDRALAZINE HCL 20 MG/ML IJ SOLN
INTRAMUSCULAR | Status: DC | PRN
  Administered 2016-01-28: 5 mg via INTRAVENOUS

## 2016-01-28 MED ORDER — LACTATED RINGERS IV SOLN
INTRAVENOUS | Status: DC
  Administered 2016-01-28 (×2): via INTRAVENOUS

## 2016-01-28 MED ORDER — SODIUM CHLORIDE 0.9 % IJ SOLN
INTRAMUSCULAR | Status: AC
  Filled 2016-01-28: qty 10

## 2016-01-28 MED ORDER — SODIUM CHLORIDE 0.9 % IR SOLN
Status: DC | PRN
  Administered 2016-01-28: 1000 mL via INTRAVESICAL

## 2016-01-28 MED ORDER — BUPIVACAINE-EPINEPHRINE 0.25% -1:200000 IJ SOLN
INTRAMUSCULAR | Status: DC | PRN
  Administered 2016-01-28: 30 mL

## 2016-01-28 MED ORDER — HYDROMORPHONE HCL 1 MG/ML IJ SOLN
0.2500 mg | INTRAMUSCULAR | Status: DC | PRN
  Administered 2016-01-28: 0.5 mg via INTRAVENOUS

## 2016-01-28 MED ORDER — ROCURONIUM BROMIDE 100 MG/10ML IV SOLN
INTRAVENOUS | Status: DC | PRN
  Administered 2016-01-28: 20 mg via INTRAVENOUS
  Administered 2016-01-28: 30 mg via INTRAVENOUS
  Administered 2016-01-28: 20 mg via INTRAVENOUS
  Administered 2016-01-28: 70 mg via INTRAVENOUS
  Administered 2016-01-28 (×3): 10 mg via INTRAVENOUS

## 2016-01-28 MED ORDER — DIPHENHYDRAMINE HCL 50 MG/ML IJ SOLN: 12.5000 mg | Freq: Four times a day (QID) | INTRAMUSCULAR | Status: DC | PRN

## 2016-01-28 MED ORDER — HYDROMORPHONE HCL 2 MG/ML IJ SOLN
INTRAMUSCULAR | Status: AC
  Filled 2016-01-28: qty 1

## 2016-01-28 MED ORDER — LIDOCAINE HCL (CARDIAC) 20 MG/ML IV SOLN
INTRAVENOUS | Status: DC | PRN
  Administered 2016-01-28: 100 mg via INTRAVENOUS

## 2016-01-28 MED ORDER — DIPHENHYDRAMINE HCL 12.5 MG/5ML PO ELIX: 12.5000 mg | ORAL_SOLUTION | Freq: Four times a day (QID) | ORAL | Status: DC | PRN

## 2016-01-28 MED ORDER — DEXAMETHASONE SODIUM PHOSPHATE 10 MG/ML IJ SOLN
INTRAMUSCULAR | Status: AC
  Filled 2016-01-28: qty 1

## 2016-01-28 MED ORDER — CEFAZOLIN IN D5W 1 GM/50ML IV SOLN
1.0000 g | Freq: Three times a day (TID) | INTRAVENOUS | Status: AC
  Administered 2016-01-28 – 2016-01-29 (×2): 1 g via INTRAVENOUS
  Filled 2016-01-28 (×2): qty 50

## 2016-01-28 MED ORDER — SUGAMMADEX SODIUM 200 MG/2ML IV SOLN
INTRAVENOUS | Status: DC | PRN
  Administered 2016-01-28: 300 mg via INTRAVENOUS

## 2016-01-28 MED ORDER — SULFAMETHOXAZOLE-TRIMETHOPRIM 800-160 MG PO TABS: 1.0000 | ORAL_TABLET | Freq: Two times a day (BID) | ORAL | 0 refills | Status: DC

## 2016-01-28 MED ORDER — CEFAZOLIN SODIUM-DEXTROSE 2-4 GM/100ML-% IV SOLN
INTRAVENOUS | Status: AC
  Filled 2016-01-28: qty 100

## 2016-01-28 MED ORDER — VITAMINS A & D EX OINT
TOPICAL_OINTMENT | CUTANEOUS | Status: AC
  Administered 2016-01-28: 21:00:00
  Filled 2016-01-28: qty 5

## 2016-01-28 MED ORDER — INSULIN ASPART 100 UNIT/ML ~~LOC~~ SOLN
4.0000 [IU] | Freq: Once | SUBCUTANEOUS | Status: AC
  Administered 2016-01-28: 4 [IU] via SUBCUTANEOUS

## 2016-01-28 MED ORDER — LACTATED RINGERS IV SOLN
INTRAVENOUS | Status: DC | PRN
  Administered 2016-01-28: 1000 mL

## 2016-01-28 MED ORDER — KETOROLAC TROMETHAMINE 15 MG/ML IJ SOLN
15.0000 mg | Freq: Four times a day (QID) | INTRAMUSCULAR | Status: DC
  Administered 2016-01-28: 15 mg via INTRAVENOUS

## 2016-01-28 MED ORDER — LACTATED RINGERS IV SOLN: INTRAVENOUS | Status: DC

## 2016-01-28 MED ORDER — ONDANSETRON HCL 4 MG/2ML IJ SOLN
INTRAMUSCULAR | Status: AC
Start: 2016-01-28 — End: 2016-01-28
  Filled 2016-01-28: qty 2

## 2016-01-28 MED ORDER — SUCCINYLCHOLINE CHLORIDE 20 MG/ML IJ SOLN
INTRAMUSCULAR | Status: DC | PRN
  Administered 2016-01-28: 100 mg via INTRAVENOUS

## 2016-01-28 MED ORDER — LIDOCAINE HCL (CARDIAC) 20 MG/ML IV SOLN
INTRAVENOUS | Status: AC
  Filled 2016-01-28: qty 5

## 2016-01-28 MED ORDER — KCL IN DEXTROSE-NACL 20-5-0.45 MEQ/L-%-% IV SOLN
INTRAVENOUS | Status: DC
  Administered 2016-01-28: 17:00:00 via INTRAVENOUS

## 2016-01-28 MED ORDER — PROPOFOL 10 MG/ML IV BOLUS
INTRAVENOUS | Status: DC | PRN
  Administered 2016-01-28: 200 mg via INTRAVENOUS

## 2016-01-28 MED ORDER — KCL IN DEXTROSE-NACL 20-5-0.45 MEQ/L-%-% IV SOLN
INTRAVENOUS | Status: AC
  Filled 2016-01-28: qty 1000

## 2016-01-28 MED ORDER — INSULIN ASPART 100 UNIT/ML ~~LOC~~ SOLN
0.0000 [IU] | SUBCUTANEOUS | Status: DC
  Administered 2016-01-28 (×2): 5 [IU] via SUBCUTANEOUS
  Administered 2016-01-29 (×3): 3 [IU] via SUBCUTANEOUS

## 2016-01-28 MED ORDER — CHLORTHALIDONE 25 MG PO TABS
25.0000 mg | ORAL_TABLET | Freq: Every morning | ORAL | Status: DC
  Administered 2016-01-29: 25 mg via ORAL
  Filled 2016-01-28: qty 1

## 2016-01-28 MED ORDER — DEXAMETHASONE SODIUM PHOSPHATE 10 MG/ML IJ SOLN
INTRAMUSCULAR | Status: DC | PRN
  Administered 2016-01-28: 10 mg via INTRAVENOUS

## 2016-01-28 MED ORDER — MIDAZOLAM HCL 2 MG/2ML IJ SOLN
INTRAMUSCULAR | Status: AC
  Filled 2016-01-28: qty 2

## 2016-01-28 MED ORDER — SUFENTANIL CITRATE 50 MCG/ML IV SOLN
INTRAVENOUS | Status: AC
  Filled 2016-01-28: qty 1

## 2016-01-28 MED ORDER — HYDROMORPHONE HCL 1 MG/ML IJ SOLN: 0.5000 mg | INTRAMUSCULAR | Status: DC | PRN

## 2016-01-28 MED ORDER — SUFENTANIL CITRATE 50 MCG/ML IV SOLN
INTRAVENOUS | Status: DC | PRN
  Administered 2016-01-28: 10 ug via INTRAVENOUS
  Administered 2016-01-28: 20 ug via INTRAVENOUS
  Administered 2016-01-28 (×2): 10 ug via INTRAVENOUS

## 2016-01-28 MED ORDER — POTASSIUM CHLORIDE IN NACL 20-0.45 MEQ/L-% IV SOLN
INTRAVENOUS | Status: DC
  Administered 2016-01-28 – 2016-01-29 (×2): via INTRAVENOUS
  Filled 2016-01-28 (×3): qty 1000

## 2016-01-28 MED ORDER — HYDROCODONE-ACETAMINOPHEN 5-325 MG PO TABS: 1.0000 | ORAL_TABLET | Freq: Four times a day (QID) | ORAL | 0 refills | Status: DC | PRN

## 2016-01-28 MED ORDER — HYDROMORPHONE HCL 1 MG/ML IJ SOLN
INTRAMUSCULAR | Status: AC
  Filled 2016-01-28: qty 1

## 2016-01-28 SURGICAL SUPPLY — 53 items
APPLICATOR COTTON TIP 6IN STRL (MISCELLANEOUS) ×3 IMPLANT
CATH FOLEY 2WAY SLVR 18FR 30CC (CATHETERS) ×4 IMPLANT
CATH ROBINSON RED A/P 16FR (CATHETERS) ×3 IMPLANT
CATH ROBINSON RED A/P 8FR (CATHETERS) ×3 IMPLANT
CATH TIEMANN FOLEY 18FR 5CC (CATHETERS) ×3 IMPLANT
CHLORAPREP W/TINT 26ML (MISCELLANEOUS) ×3 IMPLANT
CLIP LIGATING HEM O LOK PURPLE (MISCELLANEOUS) ×6 IMPLANT
COVER SURGICAL LIGHT HANDLE (MISCELLANEOUS) ×3 IMPLANT
COVER TIP SHEARS 8 DVNC (MISCELLANEOUS) ×2 IMPLANT
COVER TIP SHEARS 8MM DA VINCI (MISCELLANEOUS) ×1
CUTTER ECHEON FLEX ENDO 45 340 (ENDOMECHANICALS) ×3 IMPLANT
DECANTER SPIKE VIAL GLASS SM (MISCELLANEOUS) ×3 IMPLANT
DRAPE ARM DVNC X/XI (DISPOSABLE) ×8 IMPLANT
DRAPE COLUMN DVNC XI (DISPOSABLE) ×2 IMPLANT
DRAPE DA VINCI XI ARM (DISPOSABLE) ×4
DRAPE DA VINCI XI COLUMN (DISPOSABLE) ×1
DRAPE SURG IRRIG POUCH 19X23 (DRAPES) ×3 IMPLANT
DRSG TEGADERM 4X4.75 (GAUZE/BANDAGES/DRESSINGS) ×3 IMPLANT
ELECT REM PT RETURN 9FT ADLT (ELECTROSURGICAL) ×3
ELECTRODE REM PT RTRN 9FT ADLT (ELECTROSURGICAL) ×2 IMPLANT
GLOVE BIO SURGEON STRL SZ 6.5 (GLOVE) ×3 IMPLANT
GLOVE BIOGEL M STRL SZ7.5 (GLOVE) ×6 IMPLANT
GOWN STRL REUS W/TWL LRG LVL3 (GOWN DISPOSABLE) ×9 IMPLANT
HOLDER FOLEY CATH W/STRAP (MISCELLANEOUS) ×3 IMPLANT
IRRIG SUCT STRYKERFLOW 2 WTIP (MISCELLANEOUS) ×3
IRRIGATION SUCT STRKRFLW 2 WTP (MISCELLANEOUS) ×2 IMPLANT
IV LACTATED RINGERS 1000ML (IV SOLUTION) ×3 IMPLANT
LIQUID BAND (GAUZE/BANDAGES/DRESSINGS) ×1 IMPLANT
NDL SAFETY ECLIPSE 18X1.5 (NEEDLE) ×2 IMPLANT
NEEDLE HYPO 18GX1.5 SHARP (NEEDLE) ×3
PACK ROBOT UROLOGY CUSTOM (CUSTOM PROCEDURE TRAY) ×3 IMPLANT
RELOAD GREEN ECHELON 45 (STAPLE) ×3 IMPLANT
SEAL CANN UNIV 5-8 DVNC XI (MISCELLANEOUS) ×8 IMPLANT
SEAL XI 5MM-8MM UNIVERSAL (MISCELLANEOUS) ×4
SOLUTION ELECTROLUBE (MISCELLANEOUS) ×3 IMPLANT
SUT ETHILON 3 0 PS 1 (SUTURE) ×3 IMPLANT
SUT MNCRL 3 0 RB1 (SUTURE) ×2 IMPLANT
SUT MNCRL 3 0 VIOLET RB1 (SUTURE) ×2 IMPLANT
SUT MNCRL AB 4-0 PS2 18 (SUTURE) ×6 IMPLANT
SUT MONOCRYL 3 0 RB1 (SUTURE) ×2
SUT VIC AB 0 CT1 27 (SUTURE) ×3
SUT VIC AB 0 CT1 27XBRD ANTBC (SUTURE) ×2 IMPLANT
SUT VIC AB 0 UR5 27 (SUTURE) ×3 IMPLANT
SUT VIC AB 2-0 SH 27 (SUTURE) ×3
SUT VIC AB 2-0 SH 27X BRD (SUTURE) ×2 IMPLANT
SUT VIC AB 3-0 SH 27 (SUTURE) ×3
SUT VIC AB 3-0 SH 27XBRD (SUTURE) IMPLANT
SUT VICRYL 0 UR6 27IN ABS (SUTURE) ×6 IMPLANT
SYR 27GX1/2 1ML LL SAFETY (SYRINGE) ×3 IMPLANT
TOWEL OR 17X26 10 PK STRL BLUE (TOWEL DISPOSABLE) ×3 IMPLANT
TOWEL OR NON WOVEN STRL DISP B (DISPOSABLE) ×3 IMPLANT
TUBING INSUFFLATION 10FT LAP (TUBING) IMPLANT
WATER STERILE IRR 1500ML POUR (IV SOLUTION) ×4 IMPLANT

## 2016-01-28 NOTE — Progress Notes (Signed)
PACU-O.K. To give Toradol as ordered- per Dr. Alinda Money

## 2016-01-28 NOTE — Progress Notes (Signed)
Patient ID: Travis Bowman, male   DOB: 03-30-1961, 55 y.o.   MRN: [id number]   Post-op note  Subjective: The patient is doing well.  No complaints.  Objective: Vital signs in last 24 hours: Temp:  [98.6 F (37 C)-99.7 F (37.6 C)] 99.7 F (37.6 C) (07/31 1831) Pulse Rate:  [63-110] 97 (07/31 1831) Resp:  [11-20] 20 (07/31 1831) BP: (143-184)/(78-106) 171/99 (07/31 1831) SpO2:  [91 %-98 %] 97 % (07/31 1831) Weight:  [111.1 kg (245 lb)] 111.1 kg (245 lb) (07/31 1845)  Intake/Output from previous day: No intake/output data recorded. Intake/Output this shift: Total I/O In: 3025 [I.V.:2025; IV Piggyback:1000] Out: 655 [Urine:300; Drains:105; Blood:250]  Physical Exam:  General: Alert and oriented. Abdomen: Soft, Nondistended. Incisions: Clean and dry.  Lab Results:  Recent Labs  01/28/16 1617  HGB 15.3  HCT 43.8    Assessment/Plan: POD#0   1) Continue to monitor, ambulate, IS   Travis Bowman. MD   LOS: 0 days   Travis Bowman,LES 01/28/2016, 7:00 PM

## 2016-01-28 NOTE — Anesthesia Preprocedure Evaluation (Addendum)
Anesthesia Evaluation  Patient identified by MRN, date of birth, ID band Patient awake    Reviewed: Allergy & Precautions, H&P , NPO status , Patient's Chart, lab work & pertinent test results, reviewed documented beta blocker date and time   Airway Mallampati: III  TM Distance: >3 FB Neck ROM: full    Dental no notable dental hx. (+) Dental Advisory Given, Teeth Intact   Pulmonary neg pulmonary ROS, sleep apnea , Current Smoker,  Stop bang 7   Pulmonary exam normal breath sounds clear to auscultation       Cardiovascular Exercise Tolerance: Good hypertension, +CHF  negative cardio ROS Normal cardiovascular exam Rhythm:regular Rate:Normal     Neuro/Psych negative neurological ROS  negative psych ROS   GI/Hepatic negative GI ROS, Neg liver ROS, GERD  Medicated and Controlled,(+) Hepatitis -, C  Endo/Other  negative endocrine ROSdiabetes, Well Controlled, Type 2, Oral Hypoglycemic Agents  Renal/GU negative Renal ROS  negative genitourinary   Musculoskeletal   Abdominal   Peds  Hematology negative hematology ROS (+)   Anesthesia Other Findings   Reproductive/Obstetrics negative OB ROS                             Anesthesia Physical Anesthesia Plan  ASA: III  Anesthesia Plan: General   Post-op Pain Management:    Induction: Intravenous  Airway Management Planned: Oral ETT  Additional Equipment:   Intra-op Plan:   Post-operative Plan: Extubation in OR  Informed Consent: I have reviewed the patients History and Physical, chart, labs and discussed the procedure including the risks, benefits and alternatives for the proposed anesthesia with the patient or authorized representative who has indicated his/her understanding and acceptance.   Dental Advisory Given  Plan Discussed with: CRNA  Anesthesia Plan Comments:         Anesthesia Quick Evaluation

## 2016-01-28 NOTE — Anesthesia Procedure Notes (Signed)
Procedure Name: Intubation Date/Time: 01/28/2016 11:35 AM Performed by: Danley Danker L Patient Re-evaluated:Patient Re-evaluated prior to inductionOxygen Delivery Method: Circle system utilized Preoxygenation: Pre-oxygenation with 100% oxygen Intubation Type: IV induction Ventilation: Mask ventilation without difficulty and Oral airway inserted - appropriate to patient size Laryngoscope Size: Miller and 3 Grade View: Grade II Tube type: Oral Tube size: 8.0 mm Number of attempts: 1 Airway Equipment and Method: Stylet Placement Confirmation: ETT inserted through vocal cords under direct vision,  positive ETCO2 and breath sounds checked- equal and bilateral Secured at: 22 cm Tube secured with: Tape Dental Injury: Teeth and Oropharynx as per pre-operative assessment

## 2016-01-28 NOTE — Progress Notes (Signed)
PACU- Dr. Alinda Money made aware of patient's Hgb. And Hct. And B met results .

## 2016-01-28 NOTE — Op Note (Signed)
Preoperative diagnosis: Clinically localized adenocarcinoma of the prostate (clinical stage T2a N0 M0)  Postoperative diagnosis: Clinically localized adenocarcinoma of the prostate (clinical stage T20 N0 M0)  Procedure:  1. Robotic assisted laparoscopic radical prostatectomy (Left unilateral nerve sparing) 2. Bilateral robotic assisted laparoscopic extended pelvic lymphadenectomy  Surgeon: Pryor Curia. M.D.  Assistant(s): Debbrah Alar, PA-C  Resident: Dr. Virginia Crews  Anesthesia: General  Complications: None  EBL: 250 mL  IVF:  1700 mL crystalloid  Specimens: 1. Prostate and seminal vesicles 2. Right pelvic lymph nodes 3. Left pelvic lymph nodes  Disposition of specimens: Pathology  Drains: 1. 20 Fr coude catheter 2. # 19 Blake pelvic drain  Indication: Travis Bowman is a 55 y.o. patient with clinically localized, high risk prostate cancer.  After a thorough review of the management options for treatment of prostate cancer, he elected to proceed with surgical therapy and the above procedure(s).  We have discussed the potential benefits and risks of the procedure, side effects of the proposed treatment, the likelihood of the patient achieving the goals of the procedure, and any potential problems that might occur during the procedure or recuperation. Informed consent has been obtained.  Description of procedure:  The patient was taken to the operating room and a general anesthetic was administered. He was given preoperative antibiotics, placed in the dorsal lithotomy position, and prepped and draped in the usual sterile fashion. Next a preoperative timeout was performed. A urethral catheter was placed into the bladder and a site was selected near the umbilicus for placement of the camera port. This was placed using a standard open Hassan technique which allowed entry into the peritoneal cavity under direct vision and without difficulty. An 8 mm port was placed and a  pneumoperitoneum established. The camera was then used to inspect the abdomen and there was no evidence of any intra-abdominal injuries or other abnormalities. The remaining abdominal ports were then placed. 8 mm robotic ports were placed in the right lower quadrant, left lower quadrant, and far left lateral abdominal wall. A 5 mm port was placed in the right upper quadrant and a 12 mm port was placed in the right lateral abdominal wall for laparoscopic assistance. All ports were placed under direct vision without difficulty. The surgical cart was then docked.   Utilizing the cautery scissors, the bladder was reflected posteriorly allowing entry into the space of Retzius and identification of the endopelvic fascia and prostate. The periprostatic fat was then removed from the prostate allowing full exposure of the endopelvic fascia. The endopelvic fascia was then incised from the apex back to the base of the prostate bilaterally and the underlying levator muscle fibers were swept laterally off the prostate thereby isolating the dorsal venous complex. The dorsal vein was then stapled and divided with a 45 mm Flex Echelon stapler. Attention then turned to the bladder neck which was divided anteriorly thereby allowing entry into the bladder and exposure of the urethral catheter. The catheter balloon was deflated and the catheter was brought into the operative field and used to retract the prostate anteriorly. The posterior bladder neck was then examined and was divided allowing further dissection between the bladder and prostate posteriorly until the vasa deferentia and seminal vessels were identified. The vasa deferentia were isolated, divided, and lifted anteriorly. The seminal vesicles were dissected down to their tips with care to control the seminal vascular arterial blood supply. These structures were then lifted anteriorly and the space between Denonvillier's fascia and the  anterior rectum was developed with a  combination of sharp and blunt dissection. This isolated the vascular pedicles of the prostate.  The lateral prostatic fascia on the left side of the prostate was then sharply incised allowing release of the neurovascular bundle. The vascular pedicle of the prostate on the left side was then ligated with Weck clips between the prostate and neurovascular bundle and divided with sharp cold scissor dissection resulting in neurovascular bundle preservation. On the right side, a wide non nerve sparing dissection was performed with Weck clips used to ligate the vascular pedicle of the prostate. The neurovascular bundle on the left side was then separated off the apex of the prostate and urethra.  The urethra was then sharply transected allowing the prostate specimen to be disarticulated. The pelvis was copiously irrigated and hemostasis was ensured. There was no evidence for rectal injury.  Attention then turned to the right pelvic sidewall. The fibrofatty tissue extending from the genitofemoral nerve laterally to the confluence of the iliac vessels proximally to the hypogastric artery posteriorly and Cooper's ligament distally was dissected free from the pelvic sidewall with care to preserve the obturator nerve and major vascular structures. Weck clips were used for lymphostasis and hemostasis. An identical procedure was then performed on the contralateral side and the lymphatic packets were removed for permanent pathologic analysis.  Attention then turned to the urethral anastomosis. A 2-0 Vicryl slip knot was placed between Denonvillier's fascia, the posterior bladder neck, and the posterior urethra to reapproximate these structures. A double-armed 3-0 Monocryl suture was then used to perform a 360 running tension-free anastomosis between the bladder neck and urethra. A new urethral catheter was then placed into the bladder and irrigated. There were no blood clots within the bladder and the anastomosis  appeared to be watertight. A #19 Blake drain was then brought through the left lateral 8 mm port site and positioned appropriately within the pelvis. It was secured to the skin with a nylon suture. The surgical cart was then undocked. The right lateral 12 mm port site was closed at the fascial level with a 0 Vicryl suture placed laparoscopically. All remaining ports were then removed under direct vision. The prostate specimen was removed intact within the Endopouch retrieval bag via the periumbilical camera port site. This fascial opening was closed with two running 0 Vicryl sutures. 0.25% Marcaine was then injected into all port sites and all incisions were reapproximated at the skin level with staples. Sterile dressings were applied. The patient appeared to tolerate the procedure well and without complications. The patient was able to be extubated and transferred to the recovery unit in satisfactory condition.  Pryor Curia MD

## 2016-01-28 NOTE — Progress Notes (Signed)
PACU: Dr. Marcell Barlow notified of CBG results, 238, SSI started

## 2016-01-28 NOTE — Progress Notes (Signed)
PACU: Dr, Marcell Barlow notifies of CBG, 295, Novolog 4 units ordered to be given

## 2016-01-28 NOTE — Progress Notes (Signed)
PACU: H & H with Blood Exposure Panel Drawn

## 2016-01-28 NOTE — Transfer of Care (Signed)
Immediate Anesthesia Transfer of Care Note  Patient: Travis Bowman  Procedure(s) Performed: Procedure(s): XI ROBOTIC ASSISTED LAPAROSCOPIC RADICAL PROSTATECTOMY LEVEL 2 (N/A) PELVIC LYMPHADENECTOMY (Bilateral)  Patient Location: PACU  Anesthesia Type:General  Level of Consciousness: awake, alert  and oriented  Airway & Oxygen Therapy: Patient Spontanous Breathing and Patient connected to face mask oxygen  Post-op Assessment: Report given to RN and Post -op Vital signs reviewed and stable  Post vital signs: Reviewed and stable  Last Vitals:  Vitals:   01/28/16 0935  BP: (!) 143/78  Pulse: 63  Resp: 18  Temp: 37 C    Last Pain:  Vitals:   01/28/16 0935  TempSrc: Oral         Complications: No apparent anesthesia complications

## 2016-01-28 NOTE — Discharge Instructions (Signed)

## 2016-01-29 ENCOUNTER — Encounter: Payer: Self-pay | Admitting: Medical Oncology

## 2016-01-29 ENCOUNTER — Encounter (HOSPITAL_COMMUNITY): Payer: Self-pay | Admitting: Urology

## 2016-01-29 LAB — BASIC METABOLIC PANEL
Anion gap: 8 (ref 5–15)
BUN: 21 mg/dL — ABNORMAL HIGH (ref 6–20)
CO2: 23 mmol/L (ref 22–32)
Calcium: 8.5 mg/dL — ABNORMAL LOW (ref 8.9–10.3)
Chloride: 104 mmol/L (ref 101–111)
Creatinine, Ser: 1.08 mg/dL (ref 0.61–1.24)
GFR calc Af Amer: >60 mL/min (ref >60)
GFR calc non Af Amer: >60 mL/min (ref >60)
Glucose, Bld: 173 mg/dL — ABNORMAL HIGH (ref 65–99)
Potassium: 4.2 mmol/L (ref 3.5–5.1)
Sodium: 135 mmol/L (ref 135–145)

## 2016-01-29 LAB — GLUCOSE, CAPILLARY
Glucose-Capillary: 164 mg/dL — ABNORMAL HIGH (ref 65–99)
Glucose-Capillary: 183 mg/dL — ABNORMAL HIGH (ref 65–99)
Glucose-Capillary: 191 mg/dL — ABNORMAL HIGH (ref 65–99)
Glucose-Capillary: 201 mg/dL — ABNORMAL HIGH (ref 65–99)

## 2016-01-29 LAB — HEMOGLOBIN AND HEMATOCRIT, BLOOD
HCT: 41.1 % (ref 39.0–52.0)
Hemoglobin: 14.4 g/dL (ref 13.0–17.0)

## 2016-01-29 MED ORDER — OXYCODONE HCL 5 MG PO TABS: 5.0000 mg | ORAL_TABLET | ORAL | Status: DC | PRN

## 2016-01-29 MED ORDER — BISACODYL 10 MG RE SUPP
10.0000 mg | Freq: Once | RECTAL | Status: AC
  Administered 2016-01-29: 10 mg via RECTAL
  Filled 2016-01-29: qty 1

## 2016-01-29 MED ORDER — BISACODYL 10 MG RE SUPP: 10.0000 mg | Freq: Once | RECTAL | Status: DC

## 2016-01-29 MED ORDER — HYDROCODONE-ACETAMINOPHEN 5-325 MG PO TABS: 1.0000 | ORAL_TABLET | Freq: Four times a day (QID) | ORAL | Status: DC | PRN

## 2016-01-29 NOTE — Anesthesia Postprocedure Evaluation (Signed)
Anesthesia Post Note  Patient: Travis Bowman  Procedure(s) Performed: Procedure(s) (LRB): XI ROBOTIC ASSISTED LAPAROSCOPIC RADICAL PROSTATECTOMY LEVEL 2 (N/A) PELVIC LYMPHADENECTOMY (Bilateral)  Patient location during evaluation: PACU Anesthesia Type: General Level of consciousness: awake and alert Pain management: pain level controlled Vital Signs Assessment: post-procedure vital signs reviewed and stable Respiratory status: spontaneous breathing, nonlabored ventilation, respiratory function stable and patient connected to nasal cannula oxygen Cardiovascular status: blood pressure returned to baseline and stable Postop Assessment: no signs of nausea or vomiting Anesthetic complications: no    Last Vitals:  Vitals:   01/29/16 0201 01/29/16 0617  BP: (!) 155/88 (!) 144/87  Pulse: 82 84  Resp: 16 19  Temp: 37 C 37.1 C    Last Pain:  Vitals:   01/29/16 0823  TempSrc:   PainSc: 0-No pain                 Kenlee Vogt EDWARD

## 2016-01-29 NOTE — Progress Notes (Signed)
Oncology Nurse Navigator Documentation  Oncology Nurse Navigator Flowsheets 12/25/2015 01/14/2016 01/28/2016  Navigator Location - - -  Navigator Encounter Type Clinic/MDC Telephone;[facility]C Follow-up -  Telephone - Outgoing Call;Clinic/[facility]C Follow-up -  Abnormal Finding Date - - -  Confirmed Diagnosis Date - - -  Surgery Date - - 01/28/2016  Patient Visit Type Initial - Surgery- Visited with wife Travis Bowman. Travis Bowman is in recovery room and she has spoken with Dr. Alinda Money and the surgery went well. She states the surgery was about an hour longer than anticipated and she had gotten worried. I stayed and offered emotional support.  Barriers/Navigation Needs Education - -  Market researcher Options - -  Interventions Education Method - -  Education Method Teach-back;Verbal;Written - -  Support Groups/Services Friends and Family - -  Acuity - Level 1 -  Acuity Level 1 - Initial guidance, education and coordination as needed -  Time Spent with Patient > 120 15 30

## 2016-01-29 NOTE — Progress Notes (Signed)
Oncology Nurse Navigator Documentation  Oncology Nurse Navigator Flowsheets 01/14/2016 01/28/2016 01/29/2016  Navigator Location - - -  Navigator Encounter Type Telephone;MDC Follow-up - -  Telephone Outgoing Call;Clinic/MDC Follow-up - -  Abnormal Finding Date - - -  Confirmed Diagnosis Date - - -  Surgery Date - 01/28/2016 -  Patient Visit Type - Surgery Inpatient  Treatment Phase - - Other  Barriers/Navigation Needs - - Education-Discussed the importance of walking and deep breathing once he is discharged. He and his wife voiced understanding. He states that he his shoulder hurts worse than his surgical site. He states the shoulder is better today than last night. Dr. Alinda Money feels this is due to placement during the surgery.   Education - - Pain/ Symptom Management  Interventions - - Education Method  Education Method - - Teach-back;Verbal  Support Groups/Services - - Friends and Family  Acuity Level 1 - -  Acuity Level 1 Initial guidance, education and coordination as needed - -  Time Spent with Patient [id number]

## 2016-01-29 NOTE — Progress Notes (Addendum)
Patient ID: Travis Bowman, male   DOB: [birth year], 55 y.o.   MRN: [id number]  1 Day Post-Op Subjective: The patient is doing well.  No nausea or vomiting. Pain is adequately controlled.  Complains of some right shoulder tenderness although improved compared to last night.  Objective: Vital signs in last 24 hours: Temp:  [98.3 F (36.8 C)-99.7 F (37.6 C)] 98.8 F (37.1 C) (08/01 0617) Pulse Rate:  [63-110] 84 (08/01 0617) Resp:  [11-20] 19 (08/01 0617) BP: (143-184)/(78-106) 144/87 (08/01 0617) SpO2:  [91 %-98 %] 94 % (08/01 0617) Weight:  [111.1 kg (245 lb)] 111.1 kg (245 lb) (07/31 1845)  Intake/Output from previous day: 07/31 0701 - 08/01 0700 In: 4052.5 [P.O.:240; I.V.:2762.5; IV Piggyback:1050] Out: 2395 [Urine:1900; Drains:245; Blood:250] Intake/Output this shift: No intake/output data recorded.  Physical Exam:  General: Alert and oriented. CV: RRR Lungs: Clear bilaterally. GI: Soft, Nondistended. Incisions: Clean, dry, and intact Urine: Clear Extremities: Nontender, no erythema, no edema.  Lab Results:  Recent Labs  01/28/16 1617 01/29/16 0501  HGB 15.3 14.4  HCT 43.8 41.1      Assessment/Plan: POD# 1 s/p robotic prostatectomy.  1) SL IVF 2) Ambulate, Incentive spirometry 3) Transition to oral pain medication 4) Dulcolax suppository 5) D/C pelvic drain 6) Plan for likely discharge later today   Pryor Curia. MD   LOS: 1 day   Vedha Tercero,LES 01/29/2016, 7:13 AM

## 2016-01-29 NOTE — Discharge Summary (Signed)
Date of admission: 01/28/2016  Date of discharge: 01/29/2016  Admission diagnosis: Prostate Cancer  Discharge diagnosis: Prostate Cancer  History and Physical: For full details, please see admission history and physical. Briefly, Travis Bowman is a 55 y.o. gentleman with localized prostate cancer.  After discussing management/treatment options, he elected to proceed with surgical treatment.  Hospital Course: LINDLEY HINEY was taken to the operating room on 01/28/2016 and underwent a robotic assisted laparoscopic radical prostatectomy. He tolerated this procedure well and without complications. Postoperatively, he was able to be transferred to a regular hospital room following recovery from anesthesia.  He was able to begin ambulating the night of surgery. He remained hemodynamically stable overnight.  He had excellent urine output with appropriately minimal output from his pelvic drain and his pelvic drain was removed on POD #1.  He was transitioned to oral pain medication, tolerated a clear liquid diet, and had met all discharge criteria and was able to be discharged home later on POD#1.  Laboratory values:   Recent Labs  01/28/16 1617 01/29/16 0501  HGB 15.3 14.4  HCT 43.8 41.1    Disposition: Home  Discharge instruction:  Discharge Instructions    Discharge instructions    Complete by:  As directed   You can resume aspirin in one week.  Activity:  You are encouraged to ambulate frequently (about every hour during waking hours) to help prevent blood clots from forming in your legs or lungs.  However, you should not engage in any heavy lifting (> 10-15 lbs), strenuous activity, or straining. Diet: You should continue a clear liquid diet until passing gas from below.  Once this occurs, you may advance your diet to a soft diet that would be easy to digest (i.e soups, scrambled eggs, mashed potatoes, etc.) for 24 hours just as you would if getting over a bad stomach flu.  If tolerating this  diet well for 24 hours, you may then begin eating regular food.  It will be normal to have some amount of bloating, nausea, and abdominal discomfort intermittently. Prescriptions:  You will be provided a prescription for pain medication to take as needed.  If your pain is not severe enough to require the prescription pain medication, you may take extra strength Tylenol instead.  You should also take an over the counter stool softener (Colace 100 mg twice daily) to avoid straining with bowel movements as the pain medication may constipate you. Finally, you will also be provided a prescription for an antibiotic to begin the day prior to your return visit in the office for catheter removal. Catheter care: You will be taught how to take care of the catheter by the nursing staff prior to discharge from the hospital.  You may use both a leg bag and the larger bedside bag but it is recommended to at least use the bigger bedside bag at nighttime as the leg bag is small and will fill up overnight and also does not drain as well when lying flat. You may periodically feel a strong urge to void with the catheter in place.  This is a bladder spasm and most often can occur when having a bowel movement or when you are moving around. It is typically self-limited and usually will stop after a few minutes.  You may use some Vaseline or Neosporin around the tip of the catheter to reduce friction at the tip of the penis. Incisions: You may remove your dressing bandages the 2nd day after surgery.  You most likely will have a few small staples in each of the incisions and once the bandages are removed, the incisions may stay open to air.  You may start showering (not soaking or bathing in water) 48 hours after surgery and the incisions simply need to be patted dry after the shower.  No additional care is needed. What to call us about: You should call the office [phone]) if you develop fever > 101, persistent vomiting, or the  catheter stops draining. Also, feel free to call with any other questions you may have and remember the handout that was provided to you as a reference preoperatively which answers many of the common questions that arise after surgery.      Discharge medications:     Medication List    STOP taking these medications   aspirin EC 81 MG tablet   VIAGRA 100 MG tablet Generic drug:  sildenafil     TAKE these medications   chlorthalidone 25 MG tablet Commonly known as:  HYGROTON Take 1 tablet by mouth every morning.   glimepiride 1 MG tablet Commonly known as:  AMARYL Take 1 tablet by mouth daily with breakfast.   HYDROcodone-acetaminophen 5-325 MG tablet Commonly known as:  NORCO Take 1-2 tablets by mouth every 6 (six) hours as needed for moderate pain.   lisinopril 40 MG tablet Commonly known as:  PRINIVIL,ZESTRIL Take 1 tablet by mouth every evening.   omeprazole 40 MG capsule Commonly known as:  PRILOSEC Take 40 mg by mouth every morning.   sulfamethoxazole-trimethoprim 800-160 MG tablet Commonly known as:  BACTRIM DS,SEPTRA DS Take 1 tablet by mouth 2 (two) times daily. Start the day prior to foley removal appointment       Followup: He will followup in 1 week for catheter removal and to discuss his surgical pathology results.

## 2016-01-31 ENCOUNTER — Telehealth: Payer: Self-pay | Admitting: Medical Oncology

## 2016-01-31 NOTE — Telephone Encounter (Signed)
Lovena Le wife called and states that Dr. Alinda Money called them with pathology report. The report was worse than they had expected. Dr. Alinda Money discussed that he will need radiation and possible Androgen Deprivation but he will discuss with them in depth at his post op visit. She had a lot of questions regarding treatment and I discussed these with her. I will continue to follow them and offer them emotional support as they continue with healing and treatment. I asked her to call me any time with any questions or concerns and I can also refer them to support services if needed. She voiced understanding.

## 2016-03-24 ENCOUNTER — Encounter: Payer: Self-pay | Admitting: Radiation Oncology

## 2016-03-24 ENCOUNTER — Telehealth: Payer: Self-pay | Admitting: Medical Oncology

## 2016-03-24 NOTE — Progress Notes (Signed)
GU Location of Tumor / Histology: prostatic adenocarcinoma s/p radical prostatectomy  If Prostate Cancer, Gleason Score is (4 + 5) and PSA is (3.68) pre treatment  Travis Bowman was a former patient of [facility] due to stones then, self referred in February for evaluation of an elevated PSA of 4.59.     Past/Anticipated interventions by urology, if any: radical prostatectomy with multiple positive margins and 2 of 22 lymph nodes positive  Past/Anticipated interventions by medical oncology, if any: no  Weight changes, if any: no  Bowel/Bladder complaints, if any: incontinence and ED   Nausea/Vomiting, if any: no  Pain issues, if any:  no  SAFETY ISSUES:  Prior radiation? no  Pacemaker/ICD? no  Possible current pregnancy? no  Is the patient on methotrexate? 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

## 2016-03-24 NOTE — Progress Notes (Signed)
Called Travis Bowman to confirm his appointment for [facility] tomorrow 9/26 arriving at 12:30pm.

## 2016-03-25 ENCOUNTER — Encounter: Payer: Self-pay | Admitting: Radiation Oncology

## 2016-03-25 ENCOUNTER — Ambulatory Visit
Admission: RE | Admit: 2016-03-25 | Discharge: 2016-03-25 | Disposition: A | Discharge status: Home / Self Care | Payer: Commercial Managed Care - HMO | Source: Ambulatory Visit | Attending: Radiation Oncology | Admitting: Radiation Oncology

## 2016-03-25 ENCOUNTER — Encounter: Payer: Self-pay | Admitting: General Practice

## 2016-03-25 ENCOUNTER — Encounter: Payer: Self-pay | Admitting: Medical Oncology

## 2016-03-25 ENCOUNTER — Ambulatory Visit (HOSPITAL_BASED_OUTPATIENT_CLINIC_OR_DEPARTMENT_OTHER): Payer: Commercial Managed Care - HMO | Admitting: Oncology

## 2016-03-25 VITALS — BP 155/91 | HR 76 | Temp 98.3°F | Resp 20 | Ht 68.0 in | Wt 238.1 lb

## 2016-03-25 DIAGNOSIS — Z8619 Personal history of other infectious and parasitic diseases: Secondary | ICD-10-CM | POA: Diagnosis not present

## 2016-03-25 DIAGNOSIS — Z823 Family history of stroke: Secondary | ICD-10-CM | POA: Diagnosis not present

## 2016-03-25 DIAGNOSIS — Z8679 Personal history of other diseases of the circulatory system: Secondary | ICD-10-CM | POA: Diagnosis not present

## 2016-03-25 DIAGNOSIS — N529 Male erectile dysfunction, unspecified: Secondary | ICD-10-CM | POA: Diagnosis not present

## 2016-03-25 DIAGNOSIS — Z9889 Other specified postprocedural states: Secondary | ICD-10-CM | POA: Insufficient documentation

## 2016-03-25 DIAGNOSIS — Z9841 Cataract extraction status, right eye: Secondary | ICD-10-CM | POA: Insufficient documentation

## 2016-03-25 DIAGNOSIS — C61 Malignant neoplasm of prostate: Secondary | ICD-10-CM

## 2016-03-25 DIAGNOSIS — I10 Essential (primary) hypertension: Secondary | ICD-10-CM | POA: Diagnosis not present

## 2016-03-25 DIAGNOSIS — Z87442 Personal history of urinary calculi: Secondary | ICD-10-CM | POA: Diagnosis not present

## 2016-03-25 DIAGNOSIS — Z96651 Presence of right artificial knee joint: Secondary | ICD-10-CM | POA: Insufficient documentation

## 2016-03-25 DIAGNOSIS — Z811 Family history of alcohol abuse and dependence: Secondary | ICD-10-CM | POA: Diagnosis not present

## 2016-03-25 DIAGNOSIS — Z885 Allergy status to narcotic agent status: Secondary | ICD-10-CM | POA: Diagnosis not present

## 2016-03-25 DIAGNOSIS — F17200 Nicotine dependence, unspecified, uncomplicated: Secondary | ICD-10-CM | POA: Insufficient documentation

## 2016-03-25 DIAGNOSIS — K219 Gastro-esophageal reflux disease without esophagitis: Secondary | ICD-10-CM | POA: Diagnosis not present

## 2016-03-25 DIAGNOSIS — Z833 Family history of diabetes mellitus: Secondary | ICD-10-CM | POA: Insufficient documentation

## 2016-03-25 DIAGNOSIS — Z9842 Cataract extraction status, left eye: Secondary | ICD-10-CM | POA: Diagnosis not present

## 2016-03-25 DIAGNOSIS — C779 Secondary and unspecified malignant neoplasm of lymph node, unspecified: Secondary | ICD-10-CM | POA: Diagnosis not present

## 2016-03-25 DIAGNOSIS — R32 Unspecified urinary incontinence: Secondary | ICD-10-CM | POA: Diagnosis not present

## 2016-03-25 DIAGNOSIS — E119 Type 2 diabetes mellitus without complications: Secondary | ICD-10-CM | POA: Insufficient documentation

## 2016-03-25 DIAGNOSIS — N39498 Other specified urinary incontinence: Secondary | ICD-10-CM

## 2016-03-25 NOTE — Consult Note (Signed)
@media  print    Office Visit Report 03/25/2016    Travis Bowman         MRN: [id number]  PRIMARY CARE:  Travis Amel, MD  DOB: 1960/12/17, 55 year old Male  REFERRING:    SSN: [id number]  PROVIDER:  Raynelle Bowman, M.D.    LOCATION:  [facility] [phone]    CC/HPI: CC: Prostate Cancer   Location of consult: [facility]   Travis Bowman follows up today approximately 6 weeks out from his radical prostatectomy. He was noted to have lymph node positive disease on his radical prostatectomy. His pathology results are as noted below. He has seen considerable improvement of his continence is currently using one half pad per day. He has no other complaints at this time. He follows up today with his first postoperative PSA.     ALLERGIES: morphine    MEDICATIONS: Lisinopril 40 mg tablet  Omeprazole 40 mg capsule,delayed release 1 capsule PO Daily  Chlorthalidone 25 mg tablet  Glimepiride 1 mg tablet     GU PSH: Cystoscopy Ureteroscopy - 10/22/2015 Laparoscopy; Lymphadenectomy - 01/28/2016 Remove Kidney Stone - 10/22/2015 Robotic Radical Prostatectomy - 01/28/2016      PSH Notes: Lithotomy, Cystoscopy With Ureteroscopy, Lithotripsy, Hand Surgery   NON-GU PSH: None         GU PMH: Stress Incontinence, M/F - 03/17/2016, - 02/26/2016 ED, arterial insufficiency - 02/20/2016 Prostate Cancer, Prostate cancer - 11/30/2015       PMH Notes:   1) Prostate cancer: He is s/p a UNS RAL radical prostatectomy and BPLND on 01/28/16.   Diagnosis: pT3a N1 M0, Gleason 4+5=9 adenocarcinoma with multiple positive margins (bladder neck, right posterior) - 2/22 lymph nodes positive  Pretreatment PSA: 3.68  Pretreatment SHIM: 24    NON-GU PMH: Diabetes Type 2 Gout Hypertension     FAMILY HISTORY: 1 son - Son 2 daughters - Daughter cerebrovascular accident (CVA) - Mother Death of family member - Mother, Father sepsis - Father      SOCIAL HISTORY: Marital Status: Married Current Smoking Status: Patient does not smoke anymore. Has not smoked since 01/28/2006.  Drinks 3 drinks per month.  Does not use drugs. Drinks 2 caffeinated drinks per day. Patient's occupation Building services engineer.      Notes: Occupation, Married, Alcohol use, Number of children, Former smoker, Caffeine use    REVIEW OF SYSTEMS:     GU Review Male:  Patient denies frequent urination, hard to postpone urination, burning/ pain with urination, get up at night to urinate, leakage of urine, stream starts and stops, trouble starting your streams, and have to strain to urinate .    Gastrointestinal (Lower):  Patient denies diarrhea and constipation.    Gastrointestinal (Upper):  Patient denies nausea and vomiting.    Constitutional:  Patient denies fever, night sweats, weight loss, and fatigue.    Skin:  Patient denies skin rash/ lesion and itching.    Eyes:  Patient denies blurred vision and double vision.    Ears/ Nose/ Throat:  Patient denies sore throat and sinus problems.    Hematologic/Lymphatic:  Patient denies swollen glands and easy bruising.    Cardiovascular:  Patient denies leg swelling and chest pains.    Respiratory:  Patient denies cough and shortness of breath.    Endocrine:  Patient denies excessive thirst.    Musculoskeletal:  Patient denies back pain and joint pain.    Neurological:  Patient denies headaches and dizziness.    Psychologic:  Patient denies anxiety and depression.    VITAL SIGNS: None    MULTI-SYSTEM PHYSICAL EXAMINATION:     Constitutional: Well-nourished. No physical deformities. Normally developed. Good grooming.          PAST DATA REVIEWED:   Source Of History:  Patient  Lab Test Review:  PSA  Records Review:  Pathology Reports, Previous Patient Records  Urine Test Review:  Urinalysis  X-Ray Review: C.T. Pelvis: Reviewed Films.  Bone Scan: Reviewed Films.      03/17/16  PSA  Total PSA 0.15 ng/dl     PROCEDURES: None   ASSESSMENT: None   PLAN:   Document  Letter(s):  Created for Patient: Clinical Summary   Notes:  1. Lymph node-positive prostate cancer: We discussed Travis Bowman options for ongoing management/treatment in the multidisciplinary clinic today. It was the recommendation of the [facility] team that he consider proceeding with radiation therapy and systemic androgen deprivation for a minimum of 6 months.   I discussed this therapy in detail including the potential side effects of androgen deprivation. We reviewed the goals of treatment is being curative although he understands the very high risk for having micrometastatic disease. He is scheduled to see Dr. Tammi Klippel later this morning to discuss planning for radiation. He will be scheduled for a nurse visit in the near future to begin Lupron 45 mg. He will then see me in approximately 6 months after completion of his radiation to check his next PSA. He likely will and radiation in the next 1-2 months which will allow him additional time to further improve his continence.   2. Incontinence: This is improving as expected. Continue physical therapy and pelvic floor exercises.   3. Erectile dysfunction: We discussed how effusion deprivation will adversely affect his libido and how radiation therapy may decrease his chances for return of erectile function. He has reasonable expectations. We will address this in the future as necessary.   Cc: Dr. Burnadette Pop  Dr. Tyler Pita  Dr. Zola Button

## 2016-03-25 NOTE — Progress Notes (Signed)
[facility] Psychosocial Distress Screening Spiritual Care  Met with Karmelo and his wife in [facility] for the second time to introduce [facility] team/resources, reviewing distress screen per protocol.  Shadowed by counseling intern Rosana Fret in this encounter. The patient scored a 0 on the Psychosocial Distress Thermometer which indicates mild distress. Also assessed for distress and other psychosocial needs.   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  In contrast, his wife identifies as being more stressed.  Normalized both ranges of feelings, reminding family of ongoing [facility] team/resource availability as desired.  They report no other needs or concerns at this time.  Follow up needed: No.   Chaplain Lorrin Jackson, [place], [facility] Pager [phone] Voicemail [phone]

## 2016-03-25 NOTE — Progress Notes (Signed)
Radiation Oncology         [phone]) [phone] ________________________________  Follow up Outpatient Consultation: [place] [facility]  Name: Travis Bowman MRN: [id number]  Date: 03/25/2016  DOB: 22-Apr-1961  [id number], MD  Travis Bring, MD   REFERRING PHYSICIAN: Raynelle Bring, MD   DIAGNOSIS: 55 year old gentleman with a T3aN adenocarcinoma of the prostate with a PSA of 3.68 and a max Gleason score of 4+5.   No diagnosis found.  HISTORY OF PRESENT ILLNESS: Travis Bowman is a 55 y.o. male seen at the request of Dr. Roni Bread for a new diagnosis of prostate cancer seen in June in multidisciplinary prostate clinic. The patient was found to have an elevated PSA by his primary provider in February 2017 this was 4.59, and repeat on 10/15/2015 3.68. He was sent for evaluation with Dr. Jerlyn Ly, and has been noted patient to practice because of previous renal calculi. He has had multiple procedures to remove these. During his examination he was found to have a nodule on the right lobe, and was encouraged to undergo biopsy. On 11/28/2015 he underwent transrectal ultrasound revealing a 27.47 mL and out of 12 cores, 5 revealed malignancy, with a maximum Gleason score of 4+5 in the right midlateral gland, 4+4 in the right base lateral and right base, a 4+3 in the right mid gland, and a 3+4 in the right apex. He did have high volume disease in the high risk locations. He did undergo a bone scan on 12/11/2015 revealed no evidence of metastatic disease. He elected to undergo surgical removal of the prostate and was taken to the operating room on 01/28/16 were he had radical robotic prostatectomy and bilateral pelvic lymph node dissection. Final pathology conformed a Gleason's 5+4 cancer with disease in the right posterior soft tissue margin, extracapsular extension, and 2 of the 12 right pelvic nodes being involved. The left pelvic nodes were negative. His post-op PSA was 0.15, and he come today to discuss the role  for adjuvant therapy given his high risk features on pathology.     PREVIOUS RADIATION THERAPY: No  PAST MEDICAL HISTORY:  Past Medical History:  Diagnosis Date  . At risk for sleep apnea    STOP-BANG= 7   SENT TO PCP 02-02-2015  . ED (erectile dysfunction)   . GERD (gastroesophageal reflux disease)   . History of blood transfusion   . History of CHF (congestive heart failure)    mild acute episode 12-05-2013 in setting of HTN crisis--  resolved  . 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 History of nephrolithiasis   . 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 Hypertension   . Malignant essential hypertension   . Prostate cancer ([place])   . Right knee meniscal tear   . Type 2 diabetes mellitus ([place])   . Wears contact lenses       PAST SURGICAL HISTORY: Past Surgical History:  Procedure Laterality Date  . CATARACT EXTRACTION W/ INTRAOCULAR LENS  IMPLANT, BILATERAL  2003/  2006  . EXTRACORPOREAL SHOCK WAVE LITHOTRIPSY  x5  last one 2006  . KNEE ARTHROSCOPY WITH MEDIAL MENISECTOMY Right 02/08/2015   Procedure: RIGHT KNEE ARTHROSCOPY, PARTIAL MEDIAL MENISCECTOMY, CHRONDROPLASTY;  Surgeon: Sydnee Cabal, MD;  Location: [facility];  Service: Orthopedics;  Laterality: Right;  . LYMPHADENECTOMY Bilateral 01/28/2016   Procedure: PELVIC LYMPHADENECTOMY;  Surgeon: Travis Bring, MD;  Location: [facility];  Service: Urology;  Laterality: Bilateral;  . ORCHIECTOMY Right age 9  . PERCUTANEOUS NEPHROSTOLITHOTOMY  x2  last one 1980's  . PROSTATE BIOPSY    . ROBOT ASSISTED LAPAROSCOPIC RADICAL PROSTATECTOMY N/A 01/28/2016   Procedure: XI ROBOTIC ASSISTED LAPAROSCOPIC RADICAL PROSTATECTOMY LEVEL 2;  Surgeon: Travis Bring, MD;  Location: [facility];  Service: Urology;  Laterality: N/A;  .  TONSILLECTOMY AND ADENOIDECTOMY  as child  . TRANSTHORACIC ECHOCARDIOGRAM  12-06-2013   mild LVH,  ef 50-55%,  mild LAE and RAE,  mild dilated aortic root  . TRIGGER FINGER RELEASE Bilateral last one 2013   x 2  . URETEROLITHOTOMY  1990's    FAMILY HISTORY:  Family History  Problem Relation Age of Onset  . Stroke Mother   . CAD Mother   . Alcohol abuse Father   . Diabetes Brother   . Marfan syndrome Brother     SOCIAL HISTORY:  Social History   Social History  . Marital status: Married    Spouse name: N/A  . Number of children: 3  . Years of education: 12   Occupational History  . 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 Smoking status: Light Tobacco Smoker    Packs/day: 0.00    Types: Cigars  . Smokeless tobacco: Never Used     Comment: currently occasoinal cigar--  quit cigarettes in 2007 had smoked for 25 yrs  . Alcohol use 1.2 oz/week    2 Cans of beer per week     Comment: 2 beers week  . Drug use: No  . Sexual activity: Yes   Other Topics Concern  . Not on file   Social History Narrative  . No narrative on file  The patient is married and resides in [place]. He works as a Sport and exercise psychologist.  ALLERGIES: Morphine and related  MEDICATIONS:  Current Outpatient Prescriptions  Medication Sig Dispense Refill  . chlorthalidone (HYGROTON) 25 MG tablet Take 1 tablet by mouth every morning.   12  . glimepiride (AMARYL) 1 MG tablet Take 1 tablet by mouth daily with breakfast.   5  . lisinopril (PRINIVIL,ZESTRIL) 40 MG tablet Take 1 tablet by mouth every evening.   3  . omeprazole (PRILOSEC) 40 MG capsule Take 40 mg by mouth every morning.    [name] HYDROcodone-acetaminophen (NORCO) 5-325 MG tablet Take 1-2 tablets by mouth every 6 (six) hours as needed for moderate pain. (Patient not taking: Reported on 03/25/2016) 30 tablet 0  . VIAGRA 100 MG tablet Take 100 mg by mouth as needed.     No current facility-administered medications for this  encounter.     REVIEW OF SYSTEMS:  On review of systems, the patient reports that he is doing well overall. He denies any chest pain, shortness of breath, cough, fevers, chills, night sweats, unintended weight changes. He continues to have incontinent episodes and is now down to wearing 1 pad per day. He states he slacked off on his kegel exercises, but is now back to doing these regularly and has noticed improvement by doing so. He denies any bowel disturbances, and denies abdominal pain, nausea or vomiting. He denies any new musculoskeletal or joint aches or pains. A complete review of systems is obtained and is otherwise negative.    PHYSICAL EXAM:   height  is 5\' 8"  (1.727 m) and weight is 238 lb 1.6 oz (108 kg). His oral temperature is 98.3 F (36.8 C). His blood pressure is 155/91 (abnormal) and his pulse is 76. His respiration is 20.   Pain Scale 0/10 In general this is a well appearing caucasian male in no acute distress. He is alert and oriented x4 and appropriate throughout the examination. HEENT reveals that the patient is normocephalic, atraumatic. EOMs are intact. PERRLA. Skin is intact without any evidence of gross lesions. Cardiovascular exam reveals a regular rate and rhythm, no clicks rubs or murmurs are auscultated. Chest is clear to auscultation bilaterally. Lymphatic assessment is performed and does not reveal any adenopathy in the cervical, supraclavicular, axillary, or inguinal chains. Abdomen has active bowel sounds in all quadrants and is intact. The abdomen has well healed laparoscopic port sites, is soft, non tender, non distended. Lower extremities are negative for pretibial pitting edema, deep calf tenderness, cyanosis or clubbing.    KPS = 90  100 - Normal; no complaints; no evidence of disease. 90   - Able to carry on normal activity; minor signs or symptoms of disease. 80   - Normal activity with effort; some signs or symptoms of disease. 73   - Cares for self;  unable to carry on normal activity or to do active work. 60   - Requires occasional assistance, but is able to care for most of his personal needs. 50   - Requires considerable assistance and frequent medical care. 19   - Disabled; requires special care and assistance. 92   - Severely disabled; hospital admission is indicated although death not imminent. 57   - Very sick; hospital admission necessary; active supportive treatment necessary. 10   - Moribund; fatal processes progressing rapidly. 0     - Dead  Karnofsky DA, Abelmann [place], Craver LS and Burchenal [facility] [phone]) The use of the nitrogen mustards in the palliative treatment of carcinoma: with particular reference to bronchogenic carcinoma Cancer 1 [phone]  LABORATORY DATA:  Lab Results  Component Value Date   WBC 8.7 01/24/2016   HGB 14.4 01/29/2016   HCT 41.1 01/29/2016   MCV 88.6 01/24/2016   PLT 168 01/24/2016   Lab Results  Component Value Date   NA 135 01/29/2016   K 4.2 01/29/2016   CL 104 01/29/2016   CO2 23 01/29/2016   Lab Results  Component Value Date   ALT 39 01/24/2016   AST 31 01/24/2016   ALKPHOS 47 01/24/2016   BILITOT 0.6 01/24/2016     RADIOGRAPHY: No results found.    IMPRESSION/PLAN: 42.  55 y.o. male with a high risk T3aN, adenocarcinoma of the prostate who underwent radical prostatectomy with a detectable PSA of .15, and high risk features on pathology. Dr. Tammi Klippel has reviewed his pathology and we discussed his case today in conference. We reviewed the the implications of positive margins, extracapsular extension, and lymph node involvement on the risk of prostate cancer recurrence. We reviewed some of the evidence suggesting an advantage for patients who undergo adjuvant radiotherapy in the setting in terms of disease control and overall survival.  Dr. Tammi Klippel has recommended proceeding with ADT for a total of 6 months, with 38 fractions of radiotherapy to the prostatic fossa and pelvic lymph node chains  over 7 weeks.  We discussed the risks, benefits, short, and long term effects of radiotherapy, and the patient is interested in proceeding. He will receive an ADT injection in the next week,  continue to work on improving his incontinence, and meet back on 05/01/16 for a follow up to make sure he is doing well and ready to move forward with simulation following that appointment at 8:30 am.    The above documentation reflects my direct findings during this shared patient visit. Please see the separate note by Dr. Tammi Klippel on this date for the remainder of the patient's plan of care.   Carola Rhine, PAC  This document serves as a record of services personally performed by Tyler Pita, MD. It was created on his behalf by Bethann Humble, a trained medical scribe. The creation of this record is based on the scribe's personal observations and the provider's statements to them. This document has been checked and approved by the attending provider.

## 2016-03-25 NOTE — Progress Notes (Signed)
Hematology and Oncology Follow Up Visit  Travis Bowman [id number] [birth year] 55 y.o. 03/25/2016 1:51 PM Lujean Amel, MDKoirala, [name], MD   Principle Diagnosis: 55 year old gentleman with prostate cancer diagnosed with Gleason score 4+5 = 9 and a PSA 4.59. His staging workup did not reveal any evidence of metastatic disease in June 2017.   Prior Therapy: He is status post robotic-assisted laparoscopic radical prostatectomy and bilateral extended pelvic lymphadenectomy on 01/28/2016. The final pathology revealed prostate adenocarcinoma Gleason score 4+5 = 9 with bilateral prostate involvement and extracapsular extension. The right posterior soft tissue margins were positive and 2 out of 12 lymph nodes on the right side involved with cancer. 0 out of 10 lymph nodes in the left side had any cancer involvement.   Current therapy: Under consideration for additional therapy.  Interim History: Travis Bowman presents today for a follow-up visit. He is a pleasant gentleman was seen in evaluation the prostate cancer multidisciplinary clinic in June 2017. Since his last visit, he underwent radical prostatectomy and recovered well from the operation. He does not have any residual complications such as abdominal pain and very little incontinence. He resumed most work-related duties. He denied any hematuria or dysuria. He denied any skeletal complaints.  He does not report any headaches, blurry vision, syncope or seizures. He does not report any fevers or chills or sweats. He does not report any cough or hemoptysis. He does not report any nausea, vomiting or abdominal pain. He does not report any hematuria or dysuria. Remaining review of systems unremarkable.   Medications: I have reviewed the patient's current medications.  Current Outpatient Prescriptions  Medication Sig Dispense Refill  . chlorthalidone (HYGROTON) 25 MG tablet Take 1 tablet by mouth every morning.   12  . glimepiride (AMARYL) 1 MG tablet  Take 1 tablet by mouth daily with breakfast.   5  . HYDROcodone-acetaminophen (NORCO) 5-325 MG tablet Take 1-2 tablets by mouth every 6 (six) hours as needed for moderate pain. (Patient not taking: Reported on 03/25/2016) 30 tablet 0  . lisinopril (PRINIVIL,ZESTRIL) 40 MG tablet Take 1 tablet by mouth every evening.   3  . omeprazole (PRILOSEC) 40 MG capsule Take 40 mg by mouth every morning.    [name] VIAGRA 100 MG tablet Take 100 mg by mouth as needed.     No current facility-administered medications for this visit.      Allergies:  Allergies  Allergen Reactions  . Morphine And Related Nausea And Vomiting    Past Medical History, Surgical history, Social history, and Family History were reviewed and updated.  [name] Physical Exam:  ECOG: 0 General appearance: alert and cooperative appeared without distress. Head: Normocephalic, without obvious abnormality without oral ulcers or lesions. Neck: no adenopathy Lymph nodes: Cervical, supraclavicular, and axillary nodes normal. Heart:regular rate and rhythm, S1, S2 normal, no murmur, click, rub or gallop Lung:chest clear, no wheezing, rales, normal symmetric air entry Abdomin: soft, non-tender, without masses or organomegaly EXT:no erythema, induration, or nodules   Lab Results: Lab Results  Component Value Date   WBC 8.7 01/24/2016   HGB 14.4 01/29/2016   HCT 41.1 01/29/2016   MCV 88.6 01/24/2016   PLT 168 01/24/2016     Chemistry      Component Value Date/Time   NA 135 01/29/2016 0501   K 4.2 01/29/2016 0501   CL 104 01/29/2016 0501   CO2 23 01/29/2016 0501   BUN 21 (H) 01/29/2016 0501   CREATININE 1.08 01/29/2016 0501  Component Value Date/Time   CALCIUM 8.5 (L) 01/29/2016 0501   ALKPHOS 47 01/24/2016 1000   AST 31 01/24/2016 1000   ALT 39 01/24/2016 1000   BILITOT 0.6 01/24/2016 1000       Impression and Plan:   55 year old gentleman with:   1. Prostate cancer diagnosed in May 2017. He had a high grade  disease with Gleason score of 4+5 = 9 at the right base. His PSA was 4.59. His staging workup did not reveal any evidence of metastatic disease. He is status post laparoscopic prostatectomy with the final pathological staging indicating T3a N1 disease. He has 2 out of 12 lymph nodes on the right side involved with cancer. 0 out of 10 on the left side.  His case was discussed of any prostate cancer multidisciplinary clinic and given the positive margins and extracapsular extension he would benefit from adjuvant radiation therapy. His follow-up PSA is not detectable which indicate possible residual disease and that we are dealing with salvage therapy rather than adjuvant therapy.  I will also recommended adding androgen deprivation therapy for at least 6 months for treating of systemic disease and possibly discontinuation of androgen deprivation if his PSA becomes undetectable.  We discussed the role of other hormone therapies such as Zytiga are systemic chemotherapy in this space. There is really no clear-cut evidence that there is a role for these medications at this time. He understands he is at risk of developing advanced prostate cancer and these modalities will come in place at that time.  All his questions are answered today.  2. Incontinence: Seems to be improving at this time.   Zola Button, MD 9/26/20171:51 PM

## 2016-03-25 NOTE — Progress Notes (Unsigned)
                               Care Plan Summary  Name: Travis Bowman DOB: 1961/05/30   Your Medical Team:   Urologist -  Dr. Raynelle Bring, [facility]  Radiation Oncologist - Dr. Tyler Pita, [facility]   Medical Oncologist - Dr. Zola Button, [place]  Recommendations: 1) Androgen Deprivation (hormone injection)  2) Radiation   * These recommendations are based on information available as of today's consult.      Recommendations may change depending on the results of further tests or exams.  Next Steps: 1) Dr. Lynne Logan office will schedule hormone injection 2) Follow up with Alean Rinne 05/01/16 3) CT simulation (planning session) appointment and radiation appointments will be scheduled.  When appointments need to be scheduled, you will be contacted by [facility] and/or [facility].  Questions?  Please do not hesitate to call Cira Rue, RN, BSN, OCN at [phone] any questions or concerns.  Shirlean Mylar is your Oncology Nurse Navigator and is available to assist you while you're receiving your medical care at [facility].

## 2016-03-25 NOTE — Progress Notes (Signed)
START ON PATHWAY REGIMEN - Prostate  POS80: Ablation with [facility] Agonist x 24 Months (Additional Non-Steroidal Anti-Androgen at Physician Discretion)   A cycle is every 3 months:     Leuprolide acetate (Lupron(R)) 22.5 mg flat dose intramuscularly every 3 months. Dose Mod: None  **Always confirm dose/schedule in your pharmacy ordering system**    Patient Characteristics: Adenocarcinoma, Post-Prostatectomy Treatment - Immediate Postoperative Period, Node Positive AJCC T Stage: 3a AJCC Stage Grouping: IV Current radiographic evidence of distant metastasis? No PSA: X Gleason Primary: X Gleason Secondary: X Gleason Score: X AJCC M Stage: 0 AJCC N Stage: 1  Intent of Therapy: Curative Intent, Discussed with Patient

## 2016-03-26 ENCOUNTER — Encounter: Payer: Self-pay | Admitting: *Deleted

## 2016-04-30 NOTE — Progress Notes (Signed)
Prostate Cancer. Seen as a consult on 04/24/16  Difficulty initiating urinary stream- No Urinary frequency -No Straining to void -No Stop and start pattern No Hard to postpone urine- No urngency Pain upon urination - No Nocturia x 1 Lupron every 3 months -1st dose ~ 3 weeks ago   Total PSA 0.15 ng/dl -03/17/16  BP 140/86 (BP Location: Right Arm, Patient Position: Sitting, Cuff Size: Large)   Pulse 64   Temp 98.1 F (36.7 C) (Oral)   Ht 5\' 8"  (1.727 m)   Wt 237 lb 12.8 oz (107.9 kg)   BMI 36.16 kg/m     Wt Readings from Last 3 Encounters:  05/01/16 237 lb 12.8 oz (107.9 kg)  03/25/16 238 lb 1.6 oz (108 kg)  01/28/16 245 lb (111.1 kg)

## 2016-04-30 NOTE — Progress Notes (Addendum)
  Radiation Oncology         [phone] ________________________________  Name: JESSON SENSKE MRN: [id number]  Date: 05/01/2016  DOB: 09-Dec-1960  SIMULATION AND TREATMENT PLANNING NOTE    ICD-9-CM ICD-10-CM   1. Malignant neoplasm of prostate ([place]) 185 C61     DIAGNOSIS:  55 y.o. male with a high risk T3a N1 M0 adenocarcinoma of the prostate with Gleason 4+5 s/p prostatectomy with a detectable PSA of 0.15  NARRATIVE:  The patient was brought to the [facility].  Identity was confirmed.  All relevant records and images related to the planned course of therapy were reviewed.  The patient freely provided informed written consent to proceed with treatment after reviewing the details related to the planned course of therapy. The consent form was witnessed and verified by the simulation staff.  Then, the patient was set-up in a stable reproducible supine position for radiation therapy.  A vacuum lock pillow device was custom fabricated to position his legs in a reproducible immobilized position.  Then, I performed a urethrogram under sterile conditions to identify the prostatic apex.  CT images were obtained.  Surface markings were placed.  The CT images were loaded into the planning software.  Then the prostate target and avoidance structures including the rectum, bladder, bowel and hips were contoured.  Treatment planning then occurred.  The radiation prescription was entered and confirmed.  A total of 1 complex treatment devices were fabricated. I have requested : Intensity Modulated Radiotherapy (IMRT) is medically necessary for this case for the following reason:  Rectal sparing.[name]  PLAN:  The patient will receive 45 Gy to the pelvic nodes and then boost prostate fossa to total of 68.4 Gy in 38 fractions.  ________________________________  Sheral Apley Tammi Klippel, M.D.

## 2016-05-01 ENCOUNTER — Ambulatory Visit
Admission: RE | Admit: 2016-05-01 | Discharge: 2016-05-01 | Disposition: A | Discharge status: Home / Self Care | Payer: Commercial Managed Care - HMO | Source: Ambulatory Visit | Attending: Radiation Oncology | Admitting: Radiation Oncology

## 2016-05-01 ENCOUNTER — Encounter: Payer: Self-pay | Admitting: Radiation Oncology

## 2016-05-01 VITALS — BP 140/86 | HR 64 | Temp 98.1°F | Ht 68.0 in | Wt 237.8 lb

## 2016-05-01 DIAGNOSIS — Z823 Family history of stroke: Secondary | ICD-10-CM | POA: Insufficient documentation

## 2016-05-01 DIAGNOSIS — I10 Essential (primary) hypertension: Secondary | ICD-10-CM | POA: Diagnosis not present

## 2016-05-01 DIAGNOSIS — Z87442 Personal history of urinary calculi: Secondary | ICD-10-CM | POA: Diagnosis not present

## 2016-05-01 DIAGNOSIS — C61 Malignant neoplasm of prostate: Secondary | ICD-10-CM

## 2016-05-01 DIAGNOSIS — Z9079 Acquired absence of other genital organ(s): Secondary | ICD-10-CM | POA: Insufficient documentation

## 2016-05-01 DIAGNOSIS — F17290 Nicotine dependence, other tobacco product, uncomplicated: Secondary | ICD-10-CM | POA: Diagnosis not present

## 2016-05-01 DIAGNOSIS — E119 Type 2 diabetes mellitus without complications: Secondary | ICD-10-CM | POA: Insufficient documentation

## 2016-05-01 DIAGNOSIS — Z885 Allergy status to narcotic agent status: Secondary | ICD-10-CM | POA: Diagnosis not present

## 2016-05-01 DIAGNOSIS — K219 Gastro-esophageal reflux disease without esophagitis: Secondary | ICD-10-CM | POA: Diagnosis not present

## 2016-05-01 DIAGNOSIS — Z833 Family history of diabetes mellitus: Secondary | ICD-10-CM | POA: Diagnosis not present

## 2016-05-01 DIAGNOSIS — Z8619 Personal history of other infectious and parasitic diseases: Secondary | ICD-10-CM | POA: Insufficient documentation

## 2016-05-01 DIAGNOSIS — Z8249 Family history of ischemic heart disease and other diseases of the circulatory system: Secondary | ICD-10-CM | POA: Diagnosis not present

## 2016-05-01 DIAGNOSIS — Z510 Encounter for antineoplastic radiation therapy: Secondary | ICD-10-CM | POA: Insufficient documentation

## 2016-05-01 DIAGNOSIS — Z79899 Other long term (current) drug therapy: Secondary | ICD-10-CM | POA: Diagnosis not present

## 2016-05-01 DIAGNOSIS — Z7984 Long term (current) use of oral hypoglycemic drugs: Secondary | ICD-10-CM | POA: Insufficient documentation

## 2016-05-01 NOTE — Addendum Note (Signed)
Encounter addended by: Tyler Pita, MD on: 05/01/2016  3:17 PM<BR>    Actions taken: Sign clinical note

## 2016-05-01 NOTE — Addendum Note (Signed)
Encounter addended by: Benn Moulder, RN on: 05/01/2016  5:38 PM<BR>    Actions taken: Charge Capture section accepted

## 2016-05-01 NOTE — Progress Notes (Signed)
Radiation Oncology         [phone]) [phone] ________________________________  Follow up Note  Name: [name] MRN: [id number]  Date: 05/01/2016  DOB: 1960/12/09  BZ:JIRCVEL,FYBOF, MD  Lujean Amel, MD   REFERRING PHYSICIAN: Koirala, [name], MD   DIAGNOSIS: 55 year old gentleman with a history of T3aN adenocarcinoma of the prostate with a PSA of 3.68 and a max Gleason score of 4+5, s/p prostatectomy with a PSA of 0.15, and high risk features on pathology.  HISTORY OF PRESENT ILLNESS: Travis Bowman is a 55 y.o. male with prostate cancer who was originally seen in June 2017 at our multidisciplinary prostate clinic. His PSA was initially elevated in the 4 range and after examination by urology he had a right sided prostate nodule. On 11/28/2015 he underwent transrectal ultrasound revealing a 27.47 mL and out of 12 cores, 5 revealed malignancy, with a maximum Gleason score of 4+5 in the right midlateral gland, 4+4 in the right base lateral and right base, a 4+3 in the right mid gland, and a 3+4 in the right apex. He did have high volume disease in the high risk locations. Metastatic work up at the end of June 2017 was negative for disease. He elected to undergo surgical removal of the prostate and was taken to the operating room on 01/28/16 were he had radical robotic prostatectomy and bilateral pelvic lymph node dissection. Final pathology conformed a Gleason's 5+4 cancer with disease in the right posterior soft tissue margin, extracapsular extension, and 2 of the 12 right pelvic nodes being involved. The left pelvic nodes were negative. His post-op PSA was 0.15. He met back in the [facility] prostate clinic on 03/25/16 and Dr. Tammi Klippel recommended radiotherapy to the prostatic fossa with short term ADT. He received a lupron injection about 3 weeks ago, and comes today to discuss his urinary function in anticipation of CT simulation.  PREVIOUS RADIATION THERAPY: No  PAST MEDICAL HISTORY:  Past Medical  History:  Diagnosis Date  . At risk for sleep apnea    STOP-BANG= 7   SENT TO PCP 02-02-2015  . ED (erectile dysfunction)   . GERD (gastroesophageal reflux disease)   . History of blood transfusion   . History of CHF (congestive heart failure)    mild acute episode 12-05-2013 in setting of HTN crisis--  resolved  . 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 History of nephrolithiasis   . 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 Hypertension   . Malignant essential hypertension   . Prostate cancer ([place])   . Right knee meniscal tear   . Type 2 diabetes mellitus ([place])   . Wears contact lenses       PAST SURGICAL HISTORY: Past Surgical History:  Procedure Laterality Date  . CATARACT EXTRACTION W/ INTRAOCULAR LENS  IMPLANT, BILATERAL  2003/  2006  . EXTRACORPOREAL SHOCK WAVE LITHOTRIPSY  x5  last one 2006  . KNEE ARTHROSCOPY WITH MEDIAL MENISECTOMY Right 02/08/2015   Procedure: RIGHT KNEE ARTHROSCOPY, PARTIAL MEDIAL MENISCECTOMY, CHRONDROPLASTY;  Surgeon: Sydnee Cabal, MD;  Location: [facility];  Service: Orthopedics;  Laterality: Right;  . LYMPHADENECTOMY Bilateral 01/28/2016   Procedure: PELVIC LYMPHADENECTOMY;  Surgeon: Raynelle Bring, MD;  Location: [facility];  Service: Urology;  Laterality: Bilateral;  . ORCHIECTOMY Right age 21  . PERCUTANEOUS NEPHROSTOLITHOTOMY  x2  last one 1980's  . PROSTATE BIOPSY    . ROBOT ASSISTED LAPAROSCOPIC RADICAL PROSTATECTOMY N/A 01/28/2016   Procedure: XI ROBOTIC ASSISTED LAPAROSCOPIC RADICAL PROSTATECTOMY LEVEL 2;  Surgeon: Raynelle Bring, MD;  Location: [facility];  Service: Urology;  Laterality: N/A;  . TONSILLECTOMY AND ADENOIDECTOMY  as child  . TRANSTHORACIC ECHOCARDIOGRAM  12-06-2013   mild LVH,  ef 50-55%,  mild LAE and RAE,  mild  dilated aortic root  . TRIGGER FINGER RELEASE Bilateral last one 2013   x 2  . URETEROLITHOTOMY  1990's    FAMILY HISTORY:  Family History  Problem Relation Age of Onset  . Stroke Mother   . CAD Mother   . Alcohol abuse Father   . Diabetes Brother   . Marfan syndrome Brother     SOCIAL HISTORY:  Social History   Social History  . Marital status: Married    Spouse name: N/A  . Number of children: 3  . Years of education: 12   Occupational History  . 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 Smoking status: Light Tobacco Smoker    Packs/day: 0.00    Types: Cigars  . Smokeless tobacco: Never Used     Comment: currently occasoinal cigar--  quit cigarettes in 2007 had smoked for 25 yrs  . Alcohol use 1.2 oz/week    2 Cans of beer per week     Comment: 2 beers week  . Drug use: No  . Sexual activity: Yes   Other Topics Concern  . Not on file   Social History Narrative  . No narrative on file  The patient is married and resides in [place]. He works as a Sport and exercise psychologist.  ALLERGIES: Morphine and related  MEDICATIONS:  Current Outpatient Prescriptions  Medication Sig Dispense Refill  . chlorthalidone (HYGROTON) 25 MG tablet Take 1 tablet by mouth every morning.   12  . glimepiride (AMARYL) 1 MG tablet Take 1 tablet by mouth daily with breakfast.   5  . lisinopril (PRINIVIL,ZESTRIL) 40 MG tablet Take 1 tablet by mouth every evening.   3  . omeprazole (PRILOSEC) 40 MG capsule Take 40 mg by mouth every morning.    [name] VIAGRA 100 MG tablet Take 100 mg by mouth as needed.    [name] HYDROcodone-acetaminophen (NORCO) 5-325 MG tablet Take 1-2 tablets by mouth every 6 (six) hours as needed for moderate pain. (Patient not taking: Reported on 05/01/2016) 30 tablet 0   No current facility-administered medications for this encounter.     REVIEW OF SYSTEMS:  On review of systems, the patient reports he's doing great overall. He denies any significant fatigue  or hot flashes today. He denies fevers, chills, or abdominal pain. He reports he wears a liner in his undergarments for security, but denies any urge or stress incontinence, or dribbling. He denies any bowel dysfunction. No other complaints are verbalized.   PHYSICAL EXAM:   height is _0  (1.727 m) and weight is 237 lb 12.8 oz (107.9 kg). His oral temperature is 98.1 F (36.7 C). His blood pressure is 140/86 and his pulse is 64.   Pain Scale 0/10 In general this is a well appearing caucasian male in no acute distress. He's alert and oriented x4 and appropriate throughout the examination. Cardiopulmonary assessment is negative for acute distress and he exhibits normal effort.  KPS = 100  100 - Normal; no complaints; no evidence of disease. 90   - Able to carry on normal activity; minor signs or symptoms of disease. 80   - Normal activity with effort; some signs or symptoms of disease. 2   - Cares for self; unable to carry on normal activity or to do active work. 60   - Requires occasional assistance, but is able to care for most of his personal needs. 50   - Requires considerable assistance and frequent medical care. 44   - Disabled; requires special care and assistance. 15   - Severely disabled; hospital admission is indicated although death not imminent. 29   - Very sick; hospital admission necessary; active supportive treatment necessary. 10   - Moribund; fatal processes progressing rapidly. 0     - Dead  Karnofsky DA, Abelmann [place], Craver LS and Burchenal [facility] [phone]) The use of the nitrogen mustards in the palliative treatment of carcinoma: with particular reference to bronchogenic carcinoma Cancer 1 634-56  LABORATORY DATA:  Lab Results  Component Value Date   WBC 8.7 01/24/2016   HGB 14.4 01/29/2016   HCT 41.1 01/29/2016   MCV 88.6 01/24/2016   PLT 168 01/24/2016   Lab Results  Component Value Date   NA 135 01/29/2016   K 4.2 01/29/2016   CL 104 01/29/2016   CO2 23  01/29/2016   Lab Results  Component Value Date   ALT 39 01/24/2016   AST 31 01/24/2016   ALKPHOS 47 01/24/2016   BILITOT 0.6 01/24/2016     RADIOGRAPHY: No results found.    IMPRESSION/PLAN: 63.  55 y.o. male with a high risk T3aN, adenocarcinoma of the prostate who underwent radical prostatectomy with a detectable PSA of .15, and high risk features on pathology. We reviewed Dr. Johny Shears recommendations for treatment of his prostate cancer with ADT for a total of 6 months, and 38 fractions of radiotherapy to the prostatic fossa and pelvic lymph node chains over 7 weeks.  We again discussed the risks, benefits, short, and long term effects of radiotherapy, and the patient is interested in proceeding. His urinary symptoms at this time are stable and he continues his pelvic floor exercises. He was encouraged as well to continue this through the process. We also discussed the importance of maintaining a full bladder for treatment. After completion of our discussion, he has signed written consent. I have brought him to the simulation department and we will proceed with planning today.   In a visit lasting 30 minutes, greater than 50% of the time was spent face to face discussing urinary symptoms and importance of continuing pelvic floor exercisizes, and coordinating the patient's care.     Carola Rhine, PAC

## 2016-05-07 ENCOUNTER — Other Ambulatory Visit (HOSPITAL_COMMUNITY): Payer: Self-pay | Admitting: Gastroenterology

## 2016-05-07 DIAGNOSIS — K219 Gastro-esophageal reflux disease without esophagitis: Secondary | ICD-10-CM

## 2016-05-09 DIAGNOSIS — Z510 Encounter for antineoplastic radiation therapy: Secondary | ICD-10-CM | POA: Diagnosis not present

## 2016-05-12 ENCOUNTER — Ambulatory Visit
Admission: RE | Admit: 2016-05-12 | Discharge: 2016-05-12 | Disposition: A | Discharge status: Home / Self Care | Payer: Commercial Managed Care - HMO | Source: Ambulatory Visit | Attending: Radiation Oncology | Admitting: Radiation Oncology

## 2016-05-12 DIAGNOSIS — Z510 Encounter for antineoplastic radiation therapy: Secondary | ICD-10-CM | POA: Diagnosis not present

## 2016-05-13 ENCOUNTER — Ambulatory Visit
Admission: RE | Admit: 2016-05-13 | Discharge: 2016-05-13 | Disposition: A | Discharge status: Home / Self Care | Payer: Commercial Managed Care - HMO | Source: Ambulatory Visit | Attending: Radiation Oncology | Admitting: Radiation Oncology

## 2016-05-13 DIAGNOSIS — Z510 Encounter for antineoplastic radiation therapy: Secondary | ICD-10-CM | POA: Diagnosis not present

## 2016-05-14 ENCOUNTER — Ambulatory Visit
Admission: RE | Admit: 2016-05-14 | Discharge: 2016-05-14 | Disposition: A | Discharge status: Home / Self Care | Payer: Commercial Managed Care - HMO | Source: Ambulatory Visit | Attending: Radiation Oncology | Admitting: Radiation Oncology

## 2016-05-14 DIAGNOSIS — Z510 Encounter for antineoplastic radiation therapy: Secondary | ICD-10-CM | POA: Diagnosis not present

## 2016-05-15 ENCOUNTER — Ambulatory Visit
Admission: RE | Admit: 2016-05-15 | Payer: Commercial Managed Care - HMO | Source: Ambulatory Visit | Admitting: Radiation Oncology

## 2016-05-15 ENCOUNTER — Ambulatory Visit
Admission: RE | Admit: 2016-05-15 | Discharge: 2016-05-15 | Disposition: A | Discharge status: Home / Self Care | Payer: Commercial Managed Care - HMO | Source: Ambulatory Visit | Attending: Radiation Oncology | Admitting: Radiation Oncology

## 2016-05-15 DIAGNOSIS — Z510 Encounter for antineoplastic radiation therapy: Secondary | ICD-10-CM | POA: Diagnosis not present

## 2016-05-16 ENCOUNTER — Ambulatory Visit
Admission: RE | Admit: 2016-05-16 | Discharge: 2016-05-16 | Disposition: A | Discharge status: Home / Self Care | Payer: Commercial Managed Care - HMO | Source: Ambulatory Visit | Attending: Radiation Oncology | Admitting: Radiation Oncology

## 2016-05-16 ENCOUNTER — Ambulatory Visit (HOSPITAL_COMMUNITY)
Admission: RE | Admit: 2016-05-16 | Discharge: 2016-05-16 | Disposition: A | Discharge status: Home / Self Care | Payer: Commercial Managed Care - HMO | Source: Ambulatory Visit | Attending: Gastroenterology | Admitting: Gastroenterology

## 2016-05-16 VITALS — BP 136/71 | HR 65 | Temp 98.4°F | Resp 12 | Wt 237.2 lb

## 2016-05-16 DIAGNOSIS — K219 Gastro-esophageal reflux disease without esophagitis: Secondary | ICD-10-CM | POA: Insufficient documentation

## 2016-05-16 DIAGNOSIS — K824 Cholesterolosis of gallbladder: Secondary | ICD-10-CM | POA: Diagnosis not present

## 2016-05-16 DIAGNOSIS — C61 Malignant neoplasm of prostate: Secondary | ICD-10-CM

## 2016-05-16 DIAGNOSIS — K838 Other specified diseases of biliary tract: Secondary | ICD-10-CM | POA: Insufficient documentation

## 2016-05-16 DIAGNOSIS — Z510 Encounter for antineoplastic radiation therapy: Secondary | ICD-10-CM | POA: Diagnosis not present

## 2016-05-16 NOTE — Progress Notes (Signed)
PAIN: He is currently in no pain.  URINARY: Pt denies urinary. Pt states they urinate 0 - 1 times per night.  BOWEL: Pt reports a soft bowel movement everyday/everyother day. OTHER: Pt complains of fatigue and weakness BP 136/71   Pulse 65   Temp 98.4 F (36.9 C) (Oral)   Resp 12   Wt 237 lb 3.2 oz (107.6 kg)   SpO2 97%   BMI 36.07 kg/m  Wt Readings from Last 3 Encounters:  05/16/16 237 lb 3.2 oz (107.6 kg)  05/01/16 237 lb 12.8 oz (107.9 kg)  03/25/16 238 lb 1.6 oz (108 kg)

## 2016-05-18 ENCOUNTER — Ambulatory Visit
Admission: RE | Admit: 2016-05-18 | Discharge: 2016-05-18 | Disposition: A | Discharge status: Home / Self Care | Payer: Commercial Managed Care - HMO | Source: Ambulatory Visit | Attending: Radiation Oncology | Admitting: Radiation Oncology

## 2016-05-18 DIAGNOSIS — C61 Malignant neoplasm of prostate: Secondary | ICD-10-CM

## 2016-05-18 DIAGNOSIS — Z510 Encounter for antineoplastic radiation therapy: Secondary | ICD-10-CM | POA: Diagnosis not present

## 2016-05-18 NOTE — Progress Notes (Signed)
Weekly rad tx 6/38 completed  No hematuria, no dysuria,  Good stream, regular bowels, no c/o BP (!) 126/93 (BP Location: Left Arm, Patient Position: Sitting, Cuff Size: Normal)   Pulse 68   Temp 98.2 F (36.8 C) (Oral)   Resp 20   Wt 232 lb 6.4 oz (105.4 kg)   BMI 35.34 kg/m   Wt Readings from Last 3 Encounters:  05/18/16 232 lb 6.4 oz (105.4 kg)  05/16/16 237 lb 3.2 oz (107.6 kg)  05/01/16 237 lb 12.8 oz (107.9 kg)

## 2016-05-18 NOTE — Progress Notes (Signed)
  Radiation Oncology         [phone]   Name: Travis Bowman MRN: [id number]   Date: 05/18/2016  DOB: [birth year]   Weekly Radiation Therapy Management    ICD-9-CM ICD-10-CM   1. Prostate cancer ([place]) 185 C61   2. Malignant neoplasm of prostate ([facility]) 185 C61     Current Dose: 10.8 Gy  Planned Dose:  68.4 Gy  Narrative The patient presents for routine under treatment assessment.  The patient has completed 6/38 treatments. Denies hematuria or dysuria. He reports a good stream and regular bowels. His only complaint is some fatigue.  Set-up films were reviewed. The chart was checked.  Physical Findings  vitals were not taken for this visit.. Weight essentially stable.  No significant changes.  Impression The patient is tolerating radiation.  Plan Continue treatment as planned.         Sheral Apley Tammi Klippel, M.D.  This document serves as a record of services personally performed by Tyler Pita, MD. It was created on his behalf by Darcus Austin, a trained medical scribe. The creation of this record is based on the scribe's personal observations and the provider's statements to them. This document has been checked and approved by the attending provider.

## 2016-05-18 NOTE — Progress Notes (Signed)
   Department of Radiation Oncology  Phone:  [phone] Fax:        [phone]  Weekly Treatment Note    Name: TAJE EADS Date: 05/18/2016 MRN: [id number] DOB: Oct 31, 1960   Diagnosis:     ICD-9-CM ICD-10-CM   1. Malignant neoplasm of prostate ([place]) 185 C61      Current dose: 9 Gy  Current fraction: 5   MEDICATIONS: Current Outpatient Prescriptions  Medication Sig Dispense Refill  . chlorthalidone (HYGROTON) 25 MG tablet Take 1 tablet by mouth every morning.   12  . glimepiride (AMARYL) 1 MG tablet Take 1 tablet by mouth daily with breakfast.   5  . lisinopril (PRINIVIL,ZESTRIL) 40 MG tablet Take 1 tablet by mouth every evening.   3  . omeprazole (PRILOSEC) 40 MG capsule Take 40 mg by mouth every morning.    [name] VIAGRA 100 MG tablet Take 100 mg by mouth as needed.     No current facility-administered medications for this encounter.      ALLERGIES: Morphine and related   LABORATORY DATA:  Lab Results  Component Value Date   WBC 8.7 01/24/2016   HGB 14.4 01/29/2016   HCT 41.1 01/29/2016   MCV 88.6 01/24/2016   PLT 168 01/24/2016   Lab Results  Component Value Date   NA 135 01/29/2016   K 4.2 01/29/2016   CL 104 01/29/2016   CO2 23 01/29/2016   Lab Results  Component Value Date   ALT 39 01/24/2016   AST 31 01/24/2016   ALKPHOS 47 01/24/2016   BILITOT 0.6 01/24/2016     NARRATIVE: Rose Fillers Dicicco was seen today for weekly treatment management. The chart was checked and the patient's films were reviewed.  PAIN: He is currently in no pain.  URINARY: Pt denies urinary. Pt states they urinate 0 - 1 times per night.  BOWEL: Pt reports a soft bowel movement everyday/everyother day. OTHER: Pt complains of fatigue and weakness BP 136/71   Pulse 65   Temp 98.4 F (36.9 C) (Oral)   Resp 12   Wt 237 lb 3.2 oz (107.6 kg)   SpO2 97%   BMI 36.07 kg/m  Wt Readings from Last 3 Encounters:  05/18/16 232 lb 6.4 oz (105.4 kg)  05/16/16 237 lb 3.2 oz  (107.6 kg)  05/01/16 237 lb 12.8 oz (107.9 kg)    PHYSICAL EXAMINATION: weight is 237 lb 3.2 oz (107.6 kg). His oral temperature is 98.4 F (36.9 C). His blood pressure is 136/71 and his pulse is 65. His respiration is 12 and oxygen saturation is 97%.        ASSESSMENT: The patient is doing satisfactorily with treatment.  PLAN: We will continue with the patient's radiation treatment as planned.

## 2016-05-19 ENCOUNTER — Ambulatory Visit
Admission: RE | Admit: 2016-05-19 | Discharge: 2016-05-19 | Disposition: A | Discharge status: Home / Self Care | Payer: Commercial Managed Care - HMO | Source: Ambulatory Visit | Attending: Radiation Oncology | Admitting: Radiation Oncology

## 2016-05-19 DIAGNOSIS — Z510 Encounter for antineoplastic radiation therapy: Secondary | ICD-10-CM | POA: Diagnosis not present

## 2016-05-20 ENCOUNTER — Ambulatory Visit
Admission: RE | Admit: 2016-05-20 | Discharge: 2016-05-20 | Disposition: A | Discharge status: Home / Self Care | Payer: Commercial Managed Care - HMO | Source: Ambulatory Visit | Attending: Radiation Oncology | Admitting: Radiation Oncology

## 2016-05-20 DIAGNOSIS — Z510 Encounter for antineoplastic radiation therapy: Secondary | ICD-10-CM | POA: Diagnosis not present

## 2016-05-21 ENCOUNTER — Ambulatory Visit
Admission: RE | Admit: 2016-05-21 | Discharge: 2016-05-21 | Disposition: A | Discharge status: Home / Self Care | Payer: Commercial Managed Care - HMO | Source: Ambulatory Visit | Attending: Radiation Oncology | Admitting: Radiation Oncology

## 2016-05-21 DIAGNOSIS — Z510 Encounter for antineoplastic radiation therapy: Secondary | ICD-10-CM | POA: Diagnosis not present

## 2016-05-23 ENCOUNTER — Ambulatory Visit: Payer: Commercial Managed Care - HMO

## 2016-05-26 ENCOUNTER — Ambulatory Visit
Admission: RE | Admit: 2016-05-26 | Discharge: 2016-05-26 | Disposition: A | Discharge status: Home / Self Care | Payer: Commercial Managed Care - HMO | Source: Ambulatory Visit | Attending: Radiation Oncology | Admitting: Radiation Oncology

## 2016-05-26 DIAGNOSIS — Z510 Encounter for antineoplastic radiation therapy: Secondary | ICD-10-CM | POA: Diagnosis not present

## 2016-05-27 ENCOUNTER — Ambulatory Visit
Admission: RE | Admit: 2016-05-27 | Discharge: 2016-05-27 | Disposition: A | Discharge status: Home / Self Care | Payer: Commercial Managed Care - HMO | Source: Ambulatory Visit | Attending: Radiation Oncology | Admitting: Radiation Oncology

## 2016-05-27 DIAGNOSIS — Z510 Encounter for antineoplastic radiation therapy: Secondary | ICD-10-CM | POA: Diagnosis not present

## 2016-05-28 ENCOUNTER — Ambulatory Visit
Admission: RE | Admit: 2016-05-28 | Discharge: 2016-05-28 | Disposition: A | Discharge status: Home / Self Care | Payer: Commercial Managed Care - HMO | Source: Ambulatory Visit | Attending: Radiation Oncology | Admitting: Radiation Oncology

## 2016-05-28 DIAGNOSIS — Z510 Encounter for antineoplastic radiation therapy: Secondary | ICD-10-CM | POA: Diagnosis not present

## 2016-05-29 ENCOUNTER — Ambulatory Visit
Admission: RE | Admit: 2016-05-29 | Discharge: 2016-05-29 | Disposition: A | Discharge status: Home / Self Care | Payer: Commercial Managed Care - HMO | Source: Ambulatory Visit | Attending: Radiation Oncology | Admitting: Radiation Oncology

## 2016-05-29 DIAGNOSIS — Z510 Encounter for antineoplastic radiation therapy: Secondary | ICD-10-CM | POA: Diagnosis not present

## 2016-05-30 ENCOUNTER — Ambulatory Visit
Admission: RE | Admit: 2016-05-30 | Discharge: 2016-05-30 | Disposition: A | Discharge status: Home / Self Care | Payer: Commercial Managed Care - HMO | Source: Ambulatory Visit | Attending: Radiation Oncology | Admitting: Radiation Oncology

## 2016-05-30 VITALS — BP 132/85 | HR 67 | Resp 16 | Wt 236.6 lb

## 2016-05-30 DIAGNOSIS — C61 Malignant neoplasm of prostate: Secondary | ICD-10-CM

## 2016-05-30 DIAGNOSIS — Z510 Encounter for antineoplastic radiation therapy: Secondary | ICD-10-CM | POA: Diagnosis not present

## 2016-05-30 NOTE — Progress Notes (Signed)
Weight and vitals stable. Denies pain. Denies dysuria or hematuria. Describes a strong steady urine stream without difficulty emptying. Denies urinary leakage. Reports nocturia x 1-2, unchanged. Reports manageable diarrhea. Reports mild fatigue.  BP 132/85 (BP Location: Left Arm, Patient Position: Sitting, Cuff Size: Normal)   Pulse 67   Resp 16   Wt 236 lb 9.6 oz (107.3 kg)   SpO2 100%   BMI 35.97 kg/m  Wt Readings from Last 3 Encounters:  05/30/16 236 lb 9.6 oz (107.3 kg)  05/18/16 232 lb 6.4 oz (105.4 kg)  05/16/16 237 lb 3.2 oz (107.6 kg)

## 2016-05-30 NOTE — Progress Notes (Signed)
  Radiation Oncology         [phone]   Name: Travis Bowman MRN: [id number]   Date: 05/30/2016  DOB: 09-Apr-1961   Weekly Radiation Therapy Management    ICD-9-CM ICD-10-CM   1. Malignant neoplasm of prostate ([facility]) 185 C61     Current Dose: 25.2 Gy  Planned Dose:  68.4 Gy  Narrative The patient presents for routine under treatment assessment.  Denies pain, urinary leakage, dysuria, or hematuria. Describes a strong steady urine stream without difficulty emptying. Reports nocturia x 1-2, unchanged. Reports manageable diarrhea. Reports mild fatigue.  Set-up films were reviewed. The chart was checked.  Physical Findings  weight is 236 lb 9.6 oz (107.3 kg). His blood pressure is 132/85 and his pulse is 67. His respiration is 16 and oxygen saturation is 100%. . Weight essentially stable.  No significant changes.  Impression The patient is tolerating radiation.  Plan Continue treatment as planned. I advised the patient to purchase Imodium AD and maybe make some dietary changes.         Sheral Apley Tammi Klippel, M.D.  This document serves as a record of services personally performed by Tyler Pita, MD. It was created on his behalf by Darcus Austin, a trained medical scribe. The creation of this record is based on the scribe's personal observations and the provider's statements to them. This document has been checked and approved by the attending provider.

## 2016-06-02 ENCOUNTER — Encounter: Payer: Self-pay | Admitting: Medical Oncology

## 2016-06-02 ENCOUNTER — Ambulatory Visit
Admission: RE | Admit: 2016-06-02 | Discharge: 2016-06-02 | Disposition: A | Discharge status: Home / Self Care | Payer: Commercial Managed Care - HMO | Source: Ambulatory Visit | Attending: Radiation Oncology | Admitting: Radiation Oncology

## 2016-06-02 DIAGNOSIS — Z510 Encounter for antineoplastic radiation therapy: Secondary | ICD-10-CM | POA: Diagnosis not present

## 2016-06-03 ENCOUNTER — Ambulatory Visit
Admission: RE | Admit: 2016-06-03 | Discharge: 2016-06-03 | Disposition: A | Discharge status: Home / Self Care | Payer: Commercial Managed Care - HMO | Source: Ambulatory Visit | Attending: Radiation Oncology | Admitting: Radiation Oncology

## 2016-06-03 DIAGNOSIS — Z510 Encounter for antineoplastic radiation therapy: Secondary | ICD-10-CM | POA: Diagnosis not present

## 2016-06-04 ENCOUNTER — Ambulatory Visit
Admission: RE | Admit: 2016-06-04 | Discharge: 2016-06-04 | Disposition: A | Discharge status: Home / Self Care | Payer: Commercial Managed Care - HMO | Source: Ambulatory Visit | Attending: Radiation Oncology | Admitting: Radiation Oncology

## 2016-06-04 DIAGNOSIS — Z510 Encounter for antineoplastic radiation therapy: Secondary | ICD-10-CM | POA: Diagnosis not present

## 2016-06-05 ENCOUNTER — Encounter: Payer: Self-pay | Admitting: Radiation Oncology

## 2016-06-05 ENCOUNTER — Ambulatory Visit
Admission: RE | Admit: 2016-06-05 | Discharge: 2016-06-05 | Disposition: A | Discharge status: Home / Self Care | Payer: Commercial Managed Care - HMO | Source: Ambulatory Visit | Attending: Radiation Oncology | Admitting: Radiation Oncology

## 2016-06-05 VITALS — BP 131/86 | HR 71 | Temp 98.4°F | Ht 68.0 in | Wt 236.4 lb

## 2016-06-05 DIAGNOSIS — Z510 Encounter for antineoplastic radiation therapy: Secondary | ICD-10-CM | POA: Diagnosis not present

## 2016-06-05 DIAGNOSIS — C61 Malignant neoplasm of prostate: Secondary | ICD-10-CM

## 2016-06-05 NOTE — Progress Notes (Signed)
Mr. Igarashi presents for his 18th fraction of radiation to his prostate pelvic nodes and prostate fossa. He denies pain. He does report some mild fatigue, and will go to bed early. He denies burning, frequency or blood with urination. He reports getting up 1-2 times nightly. He reports about 3 loose stools daily, and is not currently taking any imodium. He denies any other concerns at this time.   BP 131/86   Pulse 71   Temp 98.4 F (36.9 C)   Ht 5\' 8"  (1.727 m)   Wt 236 lb 6.4 oz (107.2 kg)   SpO2 98% Comment: room air  BMI 35.94 kg/m    Wt Readings from Last 3 Encounters:  06/05/16 236 lb 6.4 oz (107.2 kg)  05/30/16 236 lb 9.6 oz (107.3 kg)  05/18/16 232 lb 6.4 oz (105.4 kg)

## 2016-06-05 NOTE — Progress Notes (Signed)
  Radiation Oncology         [phone]   Name: Travis Bowman MRN: [id number]   Date: 06/05/2016  DOB: 1961/04/14   Weekly Radiation Therapy Management    ICD-9-CM ICD-10-CM   1. Malignant neoplasm of prostate ([facility]) 185 C61     Current Dose: 32.4 Gy  Planned Dose:  68.4 Gy  Narrative The patient presents for routine under treatment assessment.  Travis Bowman presents for his 18th fraction of radiation to his prostate pelvic nodes and prostate fossa. He denies pain. He does report some mild fatigue, and will go to bed early. He denies burning, frequency or blood with urination. He reports getting up 1-2 times nightly. He reports about 3 loose stools daily, and is not currently taking any imodium. He denies any other concerns at this time.   Set-up films were reviewed. The chart was checked.  Physical Findings  height is 5\' 8"  (1.727 m) and weight is 236 lb 6.4 oz (107.2 kg). His temperature is 98.4 F (36.9 C). His blood pressure is 131/86 and his pulse is 71. His oxygen saturation is 98%. . Weight essentially stable.  No significant changes.  Impression The patient is tolerating radiation.  Plan Continue treatment as planned.         Sheral Apley Tammi Klippel, M.D.  This document serves as a record of services personally performed by Tyler Pita, MD. It was created on his behalf by Arlyce Harman, a trained medical scribe. The creation of this record is based on the scribe's personal observations and the provider's statements to them. This document has been checked and approved by the attending provider.

## 2016-06-06 ENCOUNTER — Ambulatory Visit
Admission: RE | Admit: 2016-06-06 | Discharge: 2016-06-06 | Disposition: A | Discharge status: Home / Self Care | Payer: Commercial Managed Care - HMO | Source: Ambulatory Visit | Attending: Radiation Oncology | Admitting: Radiation Oncology

## 2016-06-06 DIAGNOSIS — Z510 Encounter for antineoplastic radiation therapy: Secondary | ICD-10-CM | POA: Diagnosis not present

## 2016-06-09 ENCOUNTER — Ambulatory Visit
Admission: RE | Admit: 2016-06-09 | Discharge: 2016-06-09 | Disposition: A | Discharge status: Home / Self Care | Payer: Commercial Managed Care - HMO | Source: Ambulatory Visit | Attending: Radiation Oncology | Admitting: Radiation Oncology

## 2016-06-09 DIAGNOSIS — Z510 Encounter for antineoplastic radiation therapy: Secondary | ICD-10-CM | POA: Diagnosis not present

## 2016-06-10 ENCOUNTER — Emergency Department (HOSPITAL_COMMUNITY)
Admission: EM | Admit: 2016-06-10 | Discharge: 2016-06-10 | Disposition: A | Discharge status: Home / Self Care | Payer: Worker's Compensation | Attending: Emergency Medicine | Admitting: Emergency Medicine

## 2016-06-10 ENCOUNTER — Encounter (HOSPITAL_COMMUNITY): Payer: Self-pay | Admitting: Emergency Medicine

## 2016-06-10 ENCOUNTER — Ambulatory Visit: Payer: Commercial Managed Care - HMO

## 2016-06-10 DIAGNOSIS — R55 Syncope and collapse: Secondary | ICD-10-CM

## 2016-06-10 DIAGNOSIS — Z7984 Long term (current) use of oral hypoglycemic drugs: Secondary | ICD-10-CM | POA: Insufficient documentation

## 2016-06-10 DIAGNOSIS — I110 Hypertensive heart disease with heart failure: Secondary | ICD-10-CM | POA: Diagnosis not present

## 2016-06-10 DIAGNOSIS — I509 Heart failure, unspecified: Secondary | ICD-10-CM | POA: Insufficient documentation

## 2016-06-10 DIAGNOSIS — F17290 Nicotine dependence, other tobacco product, uncomplicated: Secondary | ICD-10-CM | POA: Diagnosis not present

## 2016-06-10 DIAGNOSIS — E1165 Type 2 diabetes mellitus with hyperglycemia: Secondary | ICD-10-CM | POA: Insufficient documentation

## 2016-06-10 DIAGNOSIS — Z8546 Personal history of malignant neoplasm of prostate: Secondary | ICD-10-CM | POA: Insufficient documentation

## 2016-06-10 DIAGNOSIS — E860 Dehydration: Secondary | ICD-10-CM

## 2016-06-10 DIAGNOSIS — R739 Hyperglycemia, unspecified: Secondary | ICD-10-CM

## 2016-06-10 LAB — BASIC METABOLIC PANEL
Anion gap: 23 — ABNORMAL HIGH (ref 5–15)
Anion gap: 4 — ABNORMAL LOW (ref 5–15)
BUN: 27 mg/dL — ABNORMAL HIGH (ref 6–20)
BUN: 28 mg/dL — ABNORMAL HIGH (ref 6–20)
CO2: 9 mmol/L — ABNORMAL LOW (ref 22–32)
CO2: 23 mmol/L (ref 22–32)
Calcium: 9.7 mg/dL (ref 8.9–10.3)
Calcium: 8.8 mg/dL — ABNORMAL LOW (ref 8.9–10.3)
Chloride: 105 mmol/L (ref 101–111)
Chloride: 111 mmol/L (ref 101–111)
Creatinine, Ser: 1.69 mg/dL — ABNORMAL HIGH (ref 0.61–1.24)
Creatinine, Ser: 1.66 mg/dL — ABNORMAL HIGH (ref 0.61–1.24)
GFR calc Af Amer: 51 mL/min — ABNORMAL LOW (ref >60)
GFR calc Af Amer: 52 mL/min — ABNORMAL LOW (ref >60)
GFR calc non Af Amer: 44 mL/min — ABNORMAL LOW (ref >60)
GFR calc non Af Amer: 45 mL/min — ABNORMAL LOW (ref >60)
Glucose, Bld: 336 mg/dL — ABNORMAL HIGH (ref 65–99)
Glucose, Bld: 130 mg/dL — ABNORMAL HIGH (ref 65–99)
Potassium: 4.7 mmol/L (ref 3.5–5.1)
Potassium: 3.7 mmol/L (ref 3.5–5.1)
Sodium: 137 mmol/L (ref 135–145)
Sodium: 138 mmol/L (ref 135–145)

## 2016-06-10 LAB — URINALYSIS, ROUTINE W REFLEX MICROSCOPIC
Bilirubin Urine: NEGATIVE
Glucose, UA: NEGATIVE mg/dL
Ketones, ur: NEGATIVE mg/dL
Leukocytes, UA: NEGATIVE
Nitrite: NEGATIVE
Protein, ur: 100 mg/dL — AB
Specific Gravity, Urine: 1.018 (ref 1.005–1.030)
pH: 5 (ref 5.0–8.0)

## 2016-06-10 LAB — URINALYSIS, MICROSCOPIC (REFLEX): Bacteria, UA: NONE SEEN

## 2016-06-10 LAB — CBC
HCT: 42.7 % (ref 39.0–52.0)
Hemoglobin: 15 g/dL (ref 13.0–17.0)
MCH: 30.5 pg (ref 26.0–34.0)
MCHC: 35.1 g/dL (ref 30.0–36.0)
MCV: 86.8 fL (ref 78.0–100.0)
Platelets: 173 10*3/uL (ref 150–400)
RBC: 4.92 MIL/uL (ref 4.22–5.81)
RDW: 13.3 % (ref 11.5–15.5)
WBC: 9.6 10*3/uL (ref 4.0–10.5)

## 2016-06-10 MED ORDER — SODIUM CHLORIDE 0.9 % IV BOLUS (SEPSIS)
1000.0000 mL | Freq: Once | INTRAVENOUS | Status: AC
  Administered 2016-06-10: 1000 mL via INTRAVENOUS

## 2016-06-10 NOTE — ED Provider Notes (Signed)
[facility] DEPT Provider Note   CSN: [id number] Arrival date & time: 06/10/16  1119     History   Chief Complaint Chief Complaint  Patient presents with  . Near Syncope    HPI Travis Bowman is a 55 y.o. male.  Patient with history of prostate cancer, undergoing radiation therapy for the past 4 weeks, diarrhea 2/2 radiation treatments -- presents with complaint of near syncope. Patient was participating in a Charity fundraiser today. He had needed to carry heavy weight upstairs. At the end of the exercise he was extremely fatigued, slightly confused. He was attended to by firefighters on scene. He never lost consciousness. Patient was placed on low-flow oxygen because his saturation was in the low 90s. Patient was pale and sweating. No chest pain. No LE edema, swelling, or h/o blood clot. No vomiting or urine problems. The onset of this condition was acute. The course is improved. Aggravating factors: none. Alleviating factors: none.       Past Medical History:  Diagnosis Date  . At risk for sleep apnea    STOP-BANG= 7   SENT TO PCP 02-02-2015  . ED (erectile dysfunction)   . GERD (gastroesophageal reflux disease)   . History of blood transfusion   . History of CHF (congestive heart failure)    mild acute episode 12-05-2013 in setting of HTN crisis--  resolved  . 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 History of nephrolithiasis   . 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 Hypertension   . Malignant essential hypertension   . Prostate cancer ([place])   . Right knee meniscal tear   . Type 2 diabetes mellitus ([place])   . Wears contact lenses     Patient Active Problem List   Diagnosis Date Noted  . Malignant neoplasm of prostate ([place]) 12/25/2015  . S/P right  knee arthroscopy 02/08/2015  . GERD (gastroesophageal reflux disease) 09/01/2014  . Proteinuria 12/06/2013  . Hematuria 12/06/2013  . Elevated brain natriuretic peptide (BNP) level 12/06/2013  . Hypertensive urgency, malignant 12/05/2013  . DM (diabetes mellitus) ([place]) 12/05/2013  . Hypertension 11/03/2011  . Nephrolithiasis 11/03/2011    Past Surgical History:  Procedure Laterality Date  . CATARACT EXTRACTION W/ INTRAOCULAR LENS  IMPLANT, BILATERAL  2003/  2006  . EXTRACORPOREAL SHOCK WAVE LITHOTRIPSY  x5  last one 2006  . KNEE ARTHROSCOPY WITH MEDIAL MENISECTOMY Right 02/08/2015   Procedure: RIGHT KNEE ARTHROSCOPY, PARTIAL MEDIAL MENISCECTOMY, CHRONDROPLASTY;  Surgeon: Sydnee Cabal, MD;  Location: [facility];  Service: Orthopedics;  Laterality: Right;  . LYMPHADENECTOMY Bilateral 01/28/2016   Procedure: PELVIC LYMPHADENECTOMY;  Surgeon: Raynelle Bring, MD;  Location: [facility];  Service: Urology;  Laterality: Bilateral;  . ORCHIECTOMY Right age 17  . PERCUTANEOUS NEPHROSTOLITHOTOMY  x2  last one 1980's  . PROSTATE BIOPSY    . ROBOT ASSISTED LAPAROSCOPIC RADICAL PROSTATECTOMY N/A 01/28/2016   Procedure: XI ROBOTIC ASSISTED LAPAROSCOPIC RADICAL PROSTATECTOMY LEVEL 2;  Surgeon: Raynelle Bring, MD;  Location: [facility];  Service: Urology;  Laterality: N/A;  . TONSILLECTOMY AND ADENOIDECTOMY  as child  . TRANSTHORACIC ECHOCARDIOGRAM  12-06-2013   mild LVH,  ef 50-55%,  mild LAE and RAE,  mild dilated aortic root  .  TRIGGER FINGER RELEASE Bilateral last one 2013   x 2  . URETEROLITHOTOMY  1990's       Home Medications    Prior to Admission medications   Medication Sig Start Date End Date Taking? Authorizing Provider  chlorthalidone (HYGROTON) 25 MG tablet Take 1 tablet by mouth every morning.  08/20/14   Historical Provider, MD  glimepiride (AMARYL) 2 MG tablet Take 2 mg by mouth daily with breakfast.    Historical Provider, MD  lisinopril (PRINIVIL,ZESTRIL) 40 MG tablet  Take 1 tablet by mouth every evening.  06/12/14   Historical Provider, MD  omeprazole (PRILOSEC) 40 MG capsule Take 40 mg by mouth every morning.    Historical Provider, MD  VIAGRA 100 MG tablet Take 100 mg by mouth as needed. 02/20/16   Historical Provider, MD    Family History Family History  Problem Relation Age of Onset  . Stroke Mother   . CAD Mother   . Alcohol abuse Father   . Diabetes Brother   . Marfan syndrome Brother     Social History Social History  Substance Use Topics  . Smoking status: Light Tobacco Smoker    Packs/day: 0.00    Types: Cigars  . Smokeless tobacco: Never Used     Comment: currently occasoinal cigar--  quit cigarettes in 2007 had smoked for 25 yrs  . Alcohol use 1.2 oz/week    2 Cans of beer per week     Comment: 2 beers week     Allergies   Morphine and related   Review of Systems Review of Systems  Constitutional: Negative for diaphoresis and fever.  Eyes: Negative for redness.  Respiratory: Positive for shortness of breath. Negative for cough.   Cardiovascular: Negative for chest pain, palpitations and leg swelling.  Gastrointestinal: Positive for diarrhea and nausea. Negative for abdominal pain and vomiting.  Genitourinary: Negative for dysuria.  Musculoskeletal: Negative for back pain and neck pain.  Skin: Negative for rash.  Neurological: Positive for light-headedness. Negative for syncope.  Psychiatric/Behavioral: Positive for confusion. The patient is not nervous/anxious.      Physical Exam Updated Vital Signs BP 104/55 (BP Location: Right Arm)   Pulse 101   Temp 97.6 F (36.4 C) (Oral)   Resp (!) 27   Ht 5\' 8"  (1.727 m)   Wt 104.3 kg   SpO2 92%   BMI 34.97 kg/m   Physical Exam  Constitutional: He is oriented to person, place, and time. He appears well-developed and well-nourished.  HENT:  Head: Normocephalic and atraumatic.  Eyes: Conjunctivae are normal. Right eye exhibits no discharge. Left eye exhibits no  discharge.  Neck: Normal range of motion. Neck supple.  Cardiovascular: Normal rate, regular rhythm and normal heart sounds.   Pulmonary/Chest: Effort normal and breath sounds normal.  Abdominal: Soft. There is no tenderness.  Neurological: He is alert and oriented to person, place, and time. No cranial nerve deficit. He exhibits normal muscle tone.  Skin: Skin is warm and dry.  Psychiatric: He has a normal mood and affect.  Nursing note and vitals reviewed.    ED Treatments / Results  Labs (all labs ordered are listed, but only abnormal results are displayed) Labs Reviewed  BASIC METABOLIC PANEL - Abnormal; Notable for the following:       Result Value   CO2 9 (*)    Glucose, Bld 336 (*)    BUN 27 (*)    Creatinine, Ser 1.69 (*)    GFR calc non  Af Amer 44 (*)    GFR calc Af Amer 51 (*)    Anion gap 23 (*)    All other components within normal limits  URINALYSIS, ROUTINE W REFLEX MICROSCOPIC - Abnormal; Notable for the following:    Hgb urine dipstick SMALL (*)    Protein, ur 100 (*)    All other components within normal limits  URINALYSIS, MICROSCOPIC (REFLEX) - Abnormal; Notable for the following:    Squamous Epithelial / LPF 0-5 (*)    All other components within normal limits  BASIC METABOLIC PANEL - Abnormal; Notable for the following:    Glucose, Bld 130 (*)    BUN 28 (*)    Creatinine, Ser 1.66 (*)    Calcium 8.8 (*)    GFR calc non Af Amer 45 (*)    GFR calc Af Amer 52 (*)    Anion gap 4 (*)    All other components within normal limits  CBC  CBG MONITORING, ED    ED ECG REPORT   Date: 06/10/2016  Rate:103  Rhythm: sinus tachycardia  QRS Axis: normal  Intervals: normal  ST/T Wave abnormalities: normal  Conduction Disutrbances:none  Narrative Interpretation:   Old EKG Reviewed: changed, now faster  I have personally reviewed the EKG tracing and agree with the computerized printout as noted.  Procedures Procedures (including critical care  time)  Medications Ordered in ED Medications  sodium chloride 0.9 % bolus 1,000 mL (not administered)  sodium chloride 0.9 % bolus 1,000 mL (not administered)     Initial Impression / Assessment and Plan / ED Course  I have reviewed the triage vital signs and the nursing notes.  Pertinent labs & imaging results that were available during my care of the patient were reviewed by me and considered in my medical decision making (see chart for details).  Clinical Course    Patient seen and examined. Will hydrate. Patient has comeback with elevated lactic acid. His symptoms and history today do not seem consistent with DKA. Bicarb and elevated anion gap noted. Patient appears well. Will need to recheck BMP after hydration completed.   Vital signs reviewed and are as follows: BP 104/55 (BP Location: Right Arm)   Pulse 101   Temp 97.6 F (36.4 C) (Oral)   Resp (!) 27   Ht 5\' 8"  (1.727 m)   Wt 104.3 kg   SpO2 92%   BMI 34.97 kg/m   5:52 PM patient monitored in the emergency department for several hours. patient has ambulated well without difficulty. He maintained his oxygen saturation in the upper 90s.  Recheck BMP returned with improvement in anion gap and increase in bicarbonate to normal levels. Creatinine is stable. Patient looks well and states that he feels much better than he did on arrival. Tachycardia resolved.  Encouraged increase in fluid intake over the next several days, PCP recheck in several days. Encouraged return to emergency department with chest pain, shortness of breath, lightheadedness/syncope or other concerns. Patient and wife verbalized understanding and agreed to plan.   Final Clinical Impressions(s) / ED Diagnoses   Final diagnoses:  Dehydration  Near syncope  Hyperglycemia   Patient with shortness of breath and confusion after extreme exercise today. Patient has had ongoing diarrhea from ongoing radiation treatments for prostate cancer, I suspect that  this contributed. Initial BMP suggested metabolic acidosis likely 2/2 increased lactate due to exercise and dehydration. AG closed with hydration. Pt looks better and feels better. Feel comfortable for discharge to  home at this time.  New Prescriptions New Prescriptions   No medications on file     Carlisle Cater, PA-C 06/10/16 [place], MD 06/13/16 [phone]

## 2016-06-10 NOTE — ED Triage Notes (Signed)
Per EMS, patient was at a training exercise with [employer] and became dizzy and had near syncopal episode.  Showed signs of paleness and diaphoresis.  A/O x4.  Treatment with prostate cancer and had treatment yesterday at [facility] long.  Denies N/V and chest pain.  LAC 18G.  Water by mouth en route.  118/50, 104 HR, 97% RA, CBG 271. Hx of DM.

## 2016-06-10 NOTE — ED Notes (Addendum)
Pulse oximetry while ambulating stayed at 99-100%

## 2016-06-10 NOTE — Discharge Instructions (Signed)
Please read and follow all provided instructions.  Your diagnoses today include:  1. Dehydration   2. Near syncope   3. Hyperglycemia     Tests performed today include:  Blood counts and electrolytes - suggests dehydration, improved with fluids  Vital signs. See below for your results today.   Medications prescribed:   None  Take any prescribed medications only as directed.  Home care instructions:  Follow any educational materials contained in this packet.  Double your fluid intake for the next 48 hours.  Follow-up instructions: Please follow-up with your primary care provider in the next 3 days for further evaluation of your symptoms.   Return instructions:   Please return to the Emergency Department if you experience worsening symptoms.   Return with chest pain, shortness of breath, passing out.  Please return if you have any other emergent concerns.  Additional Information:  Your vital signs today were: BP 144/82    Pulse 77    Temp 97.8 F (36.6 C)    Resp 19    Ht 5\' 8"  (1.727 m)    Wt 104.3 kg    SpO2 97%    BMI 34.97 kg/m  If your blood pressure (BP) was elevated above 135/85 this visit, please have this repeated by your doctor within one month. --------------

## 2016-06-10 NOTE — ED Notes (Signed)
EDP advised patient to cancel radiation treatment at [facility] for today.

## 2016-06-11 ENCOUNTER — Ambulatory Visit
Admission: RE | Admit: 2016-06-11 | Discharge: 2016-06-11 | Disposition: A | Discharge status: Home / Self Care | Payer: Commercial Managed Care - HMO | Source: Ambulatory Visit | Attending: Radiation Oncology | Admitting: Radiation Oncology

## 2016-06-11 DIAGNOSIS — Z510 Encounter for antineoplastic radiation therapy: Secondary | ICD-10-CM | POA: Diagnosis not present

## 2016-06-12 ENCOUNTER — Telehealth: Payer: Self-pay | Admitting: Medical Oncology

## 2016-06-12 ENCOUNTER — Encounter: Payer: Self-pay | Admitting: Medical Oncology

## 2016-06-12 ENCOUNTER — Ambulatory Visit
Admission: RE | Admit: 2016-06-12 | Discharge: 2016-06-12 | Disposition: A | Discharge status: Home / Self Care | Payer: Commercial Managed Care - HMO | Source: Ambulatory Visit | Attending: Radiation Oncology | Admitting: Radiation Oncology

## 2016-06-12 DIAGNOSIS — Z510 Encounter for antineoplastic radiation therapy: Secondary | ICD-10-CM | POA: Diagnosis not present

## 2016-06-12 NOTE — Progress Notes (Signed)
Travis Bowman states he was taken to the ER for dehydration. 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 We discussed  increasing fluid intake and to include things like Gatorade. He states that he has been drinking more water and he does feel better.

## 2016-06-13 ENCOUNTER — Ambulatory Visit
Admission: RE | Admit: 2016-06-13 | Discharge: 2016-06-13 | Disposition: A | Discharge status: Home / Self Care | Payer: Self-pay | Source: Ambulatory Visit | Attending: Radiation Oncology | Admitting: Radiation Oncology

## 2016-06-13 ENCOUNTER — Ambulatory Visit
Admission: RE | Admit: 2016-06-13 | Discharge: 2016-06-13 | Disposition: A | Discharge status: Home / Self Care | Payer: Commercial Managed Care - HMO | Source: Ambulatory Visit | Attending: Radiation Oncology | Admitting: Radiation Oncology

## 2016-06-13 VITALS — BP 145/87 | HR 69 | Resp 18 | Wt 236.8 lb

## 2016-06-13 DIAGNOSIS — C61 Malignant neoplasm of prostate: Secondary | ICD-10-CM

## 2016-06-13 DIAGNOSIS — Z510 Encounter for antineoplastic radiation therapy: Secondary | ICD-10-CM | POA: Diagnosis not present

## 2016-06-13 NOTE — Progress Notes (Signed)
  Radiation Oncology         [phone]   Name: Travis Bowman MRN: [id number]   Date: 06/13/2016  DOB: [birth year]   Weekly Radiation Therapy Management    ICD-9-CM ICD-10-CM   1. Malignant neoplasm of prostate (HCC) 185 C61     Current Dose: 41.4 Gy  Planned Dose:  68.4 Gy  Narrative The patient presents for routine under treatment assessment.  Denies pain, dysuria, frequency, urgency, or hematuria. Reports mild fatigue.Reports nocturia x 3. Reports bowels are loose, but denies need for imodium. 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 Reports mild fatigue.  Set-up films were reviewed. The chart was checked.  Physical Findings  weight is 236 lb 12.8 oz (107.4 kg). His blood pressure is 145/87 (abnormal) and his pulse is 69. His respiration is 18 and oxygen saturation is 100%. . Weight essentially stable.  No significant changes.  Impression The patient is tolerating radiation.  Plan Continue treatment as planned.         Sheral Apley Tammi Klippel, M.D.  This document serves as a record of services personally performed by Tyler Pita, MD. It was created on his behalf by Arlyce Harman, a trained medical scribe. The creation of this record is based on the scribe's personal observations and the provider's statements to them. This document has been checked and approved by the attending provider.

## 2016-06-13 NOTE — Progress Notes (Signed)
Weight and vitals stable. Denies pain. Reports mild fatigue. Denies dysuria, hematuria, frequency or urgency. Reports nocturia x 3. Reports bowels are loose but, denies need for imodium. Reports increased fluid intake because episode of dehydration led to ed visit on Tuesday following fire dept drill. Reports mild fatigue.   BP (!) 145/87 (BP Location: Left Arm, Patient Position: Sitting, Cuff Size: Normal)   Pulse 69   Resp 18   Wt 236 lb 12.8 oz (107.4 kg)   SpO2 100%   BMI 36.01 kg/m  Wt Readings from Last 3 Encounters:  06/13/16 236 lb 12.8 oz (107.4 kg)  06/10/16 230 lb (104.3 kg)  06/05/16 236 lb 6.4 oz (107.2 kg)

## 2016-06-16 ENCOUNTER — Encounter: Payer: Self-pay | Admitting: Medical Oncology

## 2016-06-16 ENCOUNTER — Ambulatory Visit
Admission: RE | Admit: 2016-06-16 | Discharge: 2016-06-16 | Disposition: A | Discharge status: Home / Self Care | Payer: Commercial Managed Care - HMO | Source: Ambulatory Visit | Attending: Radiation Oncology | Admitting: Radiation Oncology

## 2016-06-16 DIAGNOSIS — Z510 Encounter for antineoplastic radiation therapy: Secondary | ICD-10-CM | POA: Diagnosis not present

## 2016-06-17 ENCOUNTER — Ambulatory Visit: Payer: Commercial Managed Care - HMO

## 2016-06-17 ENCOUNTER — Ambulatory Visit
Admission: RE | Admit: 2016-06-17 | Discharge: 2016-06-17 | Disposition: A | Discharge status: Home / Self Care | Payer: Commercial Managed Care - HMO | Source: Ambulatory Visit | Attending: Radiation Oncology | Admitting: Radiation Oncology

## 2016-06-17 DIAGNOSIS — Z510 Encounter for antineoplastic radiation therapy: Secondary | ICD-10-CM | POA: Diagnosis not present

## 2016-06-18 ENCOUNTER — Ambulatory Visit: Payer: Commercial Managed Care - HMO

## 2016-06-18 ENCOUNTER — Ambulatory Visit
Admission: RE | Admit: 2016-06-18 | Discharge: 2016-06-18 | Disposition: A | Discharge status: Home / Self Care | Payer: Commercial Managed Care - HMO | Source: Ambulatory Visit | Attending: Radiation Oncology | Admitting: Radiation Oncology

## 2016-06-18 DIAGNOSIS — Z510 Encounter for antineoplastic radiation therapy: Secondary | ICD-10-CM | POA: Diagnosis not present

## 2016-06-19 ENCOUNTER — Ambulatory Visit
Admission: RE | Admit: 2016-06-19 | Discharge: 2016-06-19 | Disposition: A | Discharge status: Home / Self Care | Payer: Commercial Managed Care - HMO | Source: Ambulatory Visit | Attending: Radiation Oncology | Admitting: Radiation Oncology

## 2016-06-19 DIAGNOSIS — Z510 Encounter for antineoplastic radiation therapy: Secondary | ICD-10-CM | POA: Diagnosis not present

## 2016-06-20 ENCOUNTER — Ambulatory Visit
Admission: RE | Admit: 2016-06-20 | Discharge: 2016-06-20 | Disposition: A | Discharge status: Home / Self Care | Payer: Commercial Managed Care - HMO | Source: Ambulatory Visit | Attending: Radiation Oncology | Admitting: Radiation Oncology

## 2016-06-20 ENCOUNTER — Ambulatory Visit
Admission: RE | Admit: 2016-06-20 | Discharge: 2016-06-20 | Disposition: A | Discharge status: Home / Self Care | Payer: Self-pay | Source: Ambulatory Visit | Attending: Radiation Oncology | Admitting: Radiation Oncology

## 2016-06-20 ENCOUNTER — Encounter: Payer: Self-pay | Admitting: Radiation Oncology

## 2016-06-20 VITALS — BP 141/75 | HR 70 | Resp 18 | Wt 236.1 lb

## 2016-06-20 DIAGNOSIS — C61 Malignant neoplasm of prostate: Secondary | ICD-10-CM

## 2016-06-20 DIAGNOSIS — Z510 Encounter for antineoplastic radiation therapy: Secondary | ICD-10-CM | POA: Diagnosis not present

## 2016-06-20 NOTE — Progress Notes (Signed)
  Radiation Oncology         [phone]   Name: Travis Bowman MRN: [id number]   Date: 06/20/2016  DOB: June 30, 1961   Weekly Radiation Therapy Management    ICD-9-CM ICD-10-CM   1. Malignant neoplasm of prostate (HCC) 185 C61     Current Dose: 50.4 Gy  Planned Dose:  68.4 Gy  Narrative The patient presents for routine under treatment assessment.  Weight and vitals stable. Denies pain. Denies dysuria, hematuria, frequency, or urgency. Reports nocturia x 3. Reports bowels and energy have improved since starting boost. Reports mild fatigue.   Set-up films were reviewed. The chart was checked.  Physical Findings  weight is 236 lb 1.6 oz (107.1 kg). His blood pressure is 141/75 (abnormal) and his pulse is 70. His respiration is 18 and oxygen saturation is 100%. . Weight essentially stable.  No significant changes.  Impression The patient is tolerating radiation.  Plan Continue treatment as planned.         Sheral Apley Tammi Klippel, M.D.  This document serves as a record of services personally performed by Tyler Pita, MD. It was created on his behalf by Maryla Morrow, a trained medical scribe. The creation of this record is based on the scribe's personal observations and the provider's statements to them. This document has been checked and approved by the attending provider.

## 2016-06-20 NOTE — Progress Notes (Signed)
Weight and vitals stable. Denies pain. Denies dysuria, hematuria, frequency, or urgency. Reports nocturia x 3. Reports bowels and energy have improved since starting boost. Reports mild fatigue.   BP (!) 141/75 (BP Location: Right Arm, Patient Position: Sitting, Cuff Size: Normal)   Pulse 70   Resp 18   Wt 236 lb 1.6 oz (107.1 kg)   SpO2 100%   BMI 35.90 kg/m  Wt Readings from Last 3 Encounters:  06/20/16 236 lb 1.6 oz (107.1 kg)  06/13/16 236 lb 12.8 oz (107.4 kg)  06/10/16 230 lb (104.3 kg)

## 2016-06-24 ENCOUNTER — Ambulatory Visit
Admission: RE | Admit: 2016-06-24 | Discharge: 2016-06-24 | Disposition: A | Discharge status: Home / Self Care | Payer: Commercial Managed Care - HMO | Source: Ambulatory Visit | Attending: Radiation Oncology | Admitting: Radiation Oncology

## 2016-06-24 DIAGNOSIS — Z510 Encounter for antineoplastic radiation therapy: Secondary | ICD-10-CM | POA: Diagnosis not present

## 2016-06-25 ENCOUNTER — Ambulatory Visit
Admission: RE | Admit: 2016-06-25 | Discharge: 2016-06-25 | Disposition: A | Discharge status: Home / Self Care | Payer: Commercial Managed Care - HMO | Source: Ambulatory Visit | Attending: Radiation Oncology | Admitting: Radiation Oncology

## 2016-06-25 DIAGNOSIS — Z510 Encounter for antineoplastic radiation therapy: Secondary | ICD-10-CM | POA: Diagnosis not present

## 2016-06-26 ENCOUNTER — Ambulatory Visit
Admission: RE | Admit: 2016-06-26 | Discharge: 2016-06-26 | Disposition: A | Discharge status: Home / Self Care | Payer: Commercial Managed Care - HMO | Source: Ambulatory Visit | Attending: Radiation Oncology | Admitting: Radiation Oncology

## 2016-06-26 DIAGNOSIS — Z510 Encounter for antineoplastic radiation therapy: Secondary | ICD-10-CM | POA: Diagnosis not present

## 2016-06-27 ENCOUNTER — Ambulatory Visit
Admission: RE | Admit: 2016-06-27 | Discharge: 2016-06-27 | Disposition: A | Discharge status: Home / Self Care | Payer: Commercial Managed Care - HMO | Source: Ambulatory Visit | Attending: Radiation Oncology | Admitting: Radiation Oncology

## 2016-06-27 ENCOUNTER — Ambulatory Visit
Admission: RE | Admit: 2016-06-27 | Discharge: 2016-06-27 | Disposition: A | Discharge status: Home / Self Care | Payer: Self-pay | Source: Ambulatory Visit | Attending: Radiation Oncology | Admitting: Radiation Oncology

## 2016-06-27 VITALS — BP 153/80 | HR 72 | Resp 18 | Wt 241.8 lb

## 2016-06-27 DIAGNOSIS — C61 Malignant neoplasm of prostate: Secondary | ICD-10-CM

## 2016-06-27 DIAGNOSIS — Z510 Encounter for antineoplastic radiation therapy: Secondary | ICD-10-CM | POA: Diagnosis not present

## 2016-06-27 NOTE — Progress Notes (Signed)
  Radiation Oncology         [phone]   Name: Travis Bowman MRN: [id number]   Date: 06/27/2016  DOB: 10-16-1960   Weekly Radiation Therapy Management  No diagnosis found.  Current Dose: 57.6 Gy  Planned Dose:  68.4 Gy  Narrative The patient presents for routine under treatment assessment.  Weight and vitals stable. Denies pain. Denies dysuria, hematuria, frequency, and urgency. He rpeorts nocturia x 3. Denies diarrhea. Reports mild fatigue.  Set-up films were reviewed. The chart was checked.  Physical Findings  weight is 241 lb 12.8 oz (109.7 kg). His blood pressure is 153/80 (abnormal) and his pulse is 72. His respiration is 18 and oxygen saturation is 100%. . Weight essentially stable.  No significant changes.  Impression The patient is tolerating radiation.  Plan Continue treatment as planned.       ------------------------------------------------  Jodelle Gross, MD, PhD  This document serves as a record of services personally performed by Kyung Rudd, MD. It was created on his behalf by Maryla Morrow, a trained medical scribe. The creation of this record is based on the scribe's personal observations and the provider's statements to them. This document has been checked and approved by the attending provider.

## 2016-06-27 NOTE — Progress Notes (Signed)
Weight and vitals stable. Denies pain. Denies dysuria, hematuria, frequency and urgency. Reports nocturia x 3. Denies diarrhea. Reports mild fatigue.   BP (!) 153/80 (BP Location: Left Arm, Patient Position: Sitting, Cuff Size: Large)   Pulse 72   Resp 18   Wt 241 lb 12.8 oz (109.7 kg)   SpO2 100%   BMI 36.77 kg/m  Wt Readings from Last 3 Encounters:  06/27/16 241 lb 12.8 oz (109.7 kg)  06/20/16 236 lb 1.6 oz (107.1 kg)  06/13/16 236 lb 12.8 oz (107.4 kg)

## 2016-06-30 DIAGNOSIS — Z510 Encounter for antineoplastic radiation therapy: Secondary | ICD-10-CM | POA: Diagnosis not present

## 2016-06-30 DIAGNOSIS — C61 Malignant neoplasm of prostate: Secondary | ICD-10-CM | POA: Diagnosis not present

## 2016-07-01 ENCOUNTER — Ambulatory Visit
Admission: RE | Admit: 2016-07-01 | Discharge: 2016-07-01 | Disposition: A | Discharge status: Home / Self Care | Payer: Commercial Managed Care - HMO | Source: Ambulatory Visit | Attending: Radiation Oncology | Admitting: Radiation Oncology

## 2016-07-01 DIAGNOSIS — Z510 Encounter for antineoplastic radiation therapy: Secondary | ICD-10-CM | POA: Diagnosis not present

## 2016-07-01 DIAGNOSIS — C61 Malignant neoplasm of prostate: Secondary | ICD-10-CM | POA: Diagnosis not present

## 2016-07-02 ENCOUNTER — Ambulatory Visit
Admission: RE | Admit: 2016-07-02 | Discharge: 2016-07-02 | Disposition: A | Discharge status: Home / Self Care | Payer: Commercial Managed Care - HMO | Source: Ambulatory Visit | Attending: Radiation Oncology | Admitting: Radiation Oncology

## 2016-07-02 DIAGNOSIS — Z510 Encounter for antineoplastic radiation therapy: Secondary | ICD-10-CM | POA: Diagnosis not present

## 2016-07-02 DIAGNOSIS — C61 Malignant neoplasm of prostate: Secondary | ICD-10-CM | POA: Diagnosis not present

## 2016-07-03 ENCOUNTER — Encounter: Payer: Self-pay | Admitting: Medical Oncology

## 2016-07-03 ENCOUNTER — Ambulatory Visit
Admission: RE | Admit: 2016-07-03 | Discharge: 2016-07-03 | Disposition: A | Discharge status: Home / Self Care | Payer: Commercial Managed Care - HMO | Source: Ambulatory Visit | Attending: Radiation Oncology | Admitting: Radiation Oncology

## 2016-07-03 ENCOUNTER — Ambulatory Visit
Admission: RE | Admit: 2016-07-03 | Discharge: 2016-07-03 | Disposition: A | Discharge status: Home / Self Care | Payer: Self-pay | Source: Ambulatory Visit | Attending: Radiation Oncology | Admitting: Radiation Oncology

## 2016-07-03 VITALS — BP 138/86 | HR 72 | Resp 18 | Wt 243.0 lb

## 2016-07-03 DIAGNOSIS — C61 Malignant neoplasm of prostate: Secondary | ICD-10-CM | POA: Diagnosis not present

## 2016-07-03 DIAGNOSIS — Z510 Encounter for antineoplastic radiation therapy: Secondary | ICD-10-CM | POA: Diagnosis not present

## 2016-07-03 NOTE — Progress Notes (Signed)
Weight and vitals stable. Denies pain. Denies dysuria, hematuria, frequency and urgency. Reports nocturia x 3. Denies diarrhea. Denies fatigue. One month follow up appointment card given. Scheduled to follow up with Alinda Money in April.   BP 138/86 (BP Location: Left Arm, Patient Position: Sitting, Cuff Size: Large)   Pulse 72   Resp 18   Wt 243 lb (110.2 kg)   SpO2 100%   BMI 36.95 kg/m  Wt Readings from Last 3 Encounters:  07/03/16 243 lb (110.2 kg)  06/27/16 241 lb 12.8 oz (109.7 kg)  06/20/16 236 lb 1.6 oz (107.1 kg)

## 2016-07-03 NOTE — Progress Notes (Signed)
  Radiation Oncology         [phone]   Name: Travis Bowman MRN: [id number]   Date: 07/03/2016  DOB: 22-Apr-1961   Weekly Radiation Therapy Management    ICD-9-CM ICD-10-CM   1. Malignant neoplasm of prostate (HCC) 185 C61     Current Dose: 63 Gy  Planned Dose:  68.4 Gy  Narrative The patient presents for routine under treatment assessment.  Weight and vitals stable. Denies pain. Denies dysuria, hematuria, frequency, or urgency. Reports nocturia x 3. Denies diarrhea, or fatigue.  Set-up films were reviewed. The chart was checked.  Physical Findings  weight is 243 lb (110.2 kg). His blood pressure is 138/86 and his pulse is 72. His respiration is 18 and oxygen saturation is 100%. . Weight essentially stable.  No significant changes.  Impression The patient is tolerating radiation.  Plan Continue radiation therapy as planned. One month follow up card was given. The patient will follow up with Dr. Alinda Bowman in April 2018.         Travis Bowman, M.D.  This document serves as a record of services personally performed by Travis Pita, MD. It was created on his behalf by Bethann Humble, a trained medical scribe. The creation of this record is based on the scribe's personal observations and the provider's statements to them. This document has been checked and approved by the attending provider.

## 2016-07-04 ENCOUNTER — Ambulatory Visit
Admission: RE | Admit: 2016-07-04 | Discharge: 2016-07-04 | Disposition: A | Discharge status: Home / Self Care | Payer: Commercial Managed Care - HMO | Source: Ambulatory Visit | Attending: Radiation Oncology | Admitting: Radiation Oncology

## 2016-07-04 DIAGNOSIS — C61 Malignant neoplasm of prostate: Secondary | ICD-10-CM | POA: Diagnosis not present

## 2016-07-04 DIAGNOSIS — Z510 Encounter for antineoplastic radiation therapy: Secondary | ICD-10-CM | POA: Diagnosis not present

## 2016-07-07 ENCOUNTER — Ambulatory Visit
Admission: RE | Admit: 2016-07-07 | Discharge: 2016-07-07 | Disposition: A | Discharge status: Home / Self Care | Payer: Commercial Managed Care - HMO | Source: Ambulatory Visit | Attending: Radiation Oncology | Admitting: Radiation Oncology

## 2016-07-07 ENCOUNTER — Ambulatory Visit
Admission: RE | Admit: 2016-07-07 | Discharge: 2016-07-07 | Disposition: A | Discharge status: Home / Self Care | Payer: Self-pay | Source: Ambulatory Visit | Attending: Radiation Oncology | Admitting: Radiation Oncology

## 2016-07-07 ENCOUNTER — Encounter: Payer: Self-pay | Admitting: Radiation Oncology

## 2016-07-07 ENCOUNTER — Ambulatory Visit: Payer: Commercial Managed Care - HMO

## 2016-07-07 VITALS — BP 140/76 | HR 75 | Temp 98.3°F | Resp 20 | Wt 247.4 lb

## 2016-07-07 DIAGNOSIS — C61 Malignant neoplasm of prostate: Secondary | ICD-10-CM | POA: Diagnosis not present

## 2016-07-07 DIAGNOSIS — Z510 Encounter for antineoplastic radiation therapy: Secondary | ICD-10-CM | POA: Diagnosis not present

## 2016-07-07 NOTE — Progress Notes (Signed)
  Radiation Oncology         [phone]   Name: Travis Bowman MRN: [id number]   Date: 07/07/2016  DOB: 13-Jul-1960   Weekly Radiation Therapy Management    ICD-9-CM ICD-10-CM   1. Malignant neoplasm of prostate (HCC) 185 C61     Current Dose: 66.6 Gy  Planned Dose:  68.4 Gy  Narrative The patient presents for routine under treatment assessment.  The patient has completed 37/38 treatments to the prostatic fossa. He denies pain or fatigue. He denies bowel or bladder concerns. The patient reports a good appetite, and he drinks plenty of water.  Set-up films were reviewed. The chart was checked.  Physical Findings  weight is 247 lb 6.4 oz (112.2 kg). His oral temperature is 98.3 F (36.8 C). His blood pressure is 140/76 and his pulse is 75. His respiration is 20.  Weight essentially stable.  No significant changes.  Impression The patient is tolerating radiation.  Plan Continue radiation therapy as planned. The patient will follow up in Radiation Oncology on 08/13/16. The patient will follow up with Dr. Alinda Money in April 2018.         Sheral Apley Tammi Klippel, M.D.  This document serves as a record of services personally performed by Tyler Pita, MD. It was created on his behalf by Maryla Morrow, a trained medical scribe. The creation of this record is based on the scribe's personal observations and the provider's statements to them. This document has been checked and approved by the attending provider.

## 2016-07-07 NOTE — Progress Notes (Signed)
Weekly rad tx prostate 37/38 completed,  No hematuria ,no nocturia, regular bowel movemnts, no bladder issues stated , no pain, appetite good, drinks plenty water, no fatigue, since he started his boost tx, follow up appt 08/13/16 9:49 AM BP 140/76 (BP Location: Left Arm, Patient Position: Sitting, Cuff Size: Large)   Pulse 75   Temp 98.3 F (36.8 C) (Oral)   Resp 20   Wt 247 lb 6.4 oz (112.2 kg)   BMI 37.62 kg/m   Wt Readings from Last 3 Encounters:  07/07/16 247 lb 6.4 oz (112.2 kg)  07/03/16 243 lb (110.2 kg)  06/27/16 241 lb 12.8 oz (109.7 kg)

## 2016-07-08 ENCOUNTER — Ambulatory Visit
Admission: RE | Admit: 2016-07-08 | Discharge: 2016-07-08 | Disposition: A | Discharge status: Home / Self Care | Payer: Commercial Managed Care - HMO | Source: Ambulatory Visit | Attending: Radiation Oncology | Admitting: Radiation Oncology

## 2016-07-08 ENCOUNTER — Encounter: Payer: Self-pay | Admitting: Medical Oncology

## 2016-07-08 ENCOUNTER — Encounter: Payer: Self-pay | Admitting: Radiation Oncology

## 2016-07-08 DIAGNOSIS — Z510 Encounter for antineoplastic radiation therapy: Secondary | ICD-10-CM | POA: Diagnosis not present

## 2016-07-08 DIAGNOSIS — C61 Malignant neoplasm of prostate: Secondary | ICD-10-CM | POA: Diagnosis not present

## 2016-07-08 NOTE — Progress Notes (Signed)
Celebrated with Travis Bowman and his family as he rang the bell after completing radiation. He has tolerated radiation well. Follow up with Shona Simpson, PA 08/13/16. I asked him to call with any questions or concerns.

## 2016-07-11 NOTE — Progress Notes (Signed)
  Radiation Oncology         [phone] ________________________________  Name: Travis Bowman MRN: [id number]  Date: 07/08/2016  DOB: 10/27/1960  End of Treatment Note  Diagnosis:   56 year old gentleman with a T3aN adenocarcinoma of the prostate with a PSA of 3.68 and a max Gleason score of 4+5.      Indication for treatment:  Curative       Radiation treatment dates:   1113/2017 to 07/08/2016  Site/dose:    1. The prostate initial pelvic nodes were treated to 45 Gy in 25 fractions at 1.8 Gy per fraction.  2. The prostate fossa was boosted to 23.4 Gy in 13 fractions at 1.8 Gy per fraction.   Beams/energy:    1. IMRT // 6X 2. IMRT // 6X  Narrative: The patient tolerated radiation treatment relatively well.   He denies pain, fatigue, bowel or bladder concerns. The patient did not have any concerns or complaints with treatment.   Plan: The patient has completed radiation treatment. The patient will return to radiation oncology clinic for routine followup in one month. I advised him to call or return sooner if he has any questions or concerns related to his recovery or treatment. ________________________________  Sheral Apley. Tammi Klippel, M.D.  This document serves as a record of services personally performed by Tyler Pita, MD. It was created on his behalf by Arlyce Harman, a trained medical scribe. The creation of this record is based on the scribe's personal observations and the provider's statements to them. This document has been checked and approved by the attending provider.

## 2016-07-15 DIAGNOSIS — H524 Presbyopia: Secondary | ICD-10-CM | POA: Diagnosis not present

## 2016-07-15 DIAGNOSIS — E113293 Type 2 diabetes mellitus with mild nonproliferative diabetic retinopathy without macular edema, bilateral: Secondary | ICD-10-CM | POA: Diagnosis not present

## 2016-07-28 DIAGNOSIS — G4733 Obstructive sleep apnea (adult) (pediatric): Secondary | ICD-10-CM | POA: Diagnosis not present

## 2016-07-29 ENCOUNTER — Telehealth: Payer: Self-pay | Admitting: *Deleted

## 2016-07-29 NOTE — Telephone Encounter (Signed)
CALLED PATIENT TO ALTER FU ON 08-13-16 PER ALISON PERKINS, APPT. MOVED TO 08-13-16 @ 9 AM, LVM FOR A RETURN CALL

## 2016-08-06 NOTE — Progress Notes (Signed)
Travis Bowman 56 y.o. 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    URINARY: He denies reports urinary frequency,urinary urgency.  Reports he is  emptying his bladder . Pt states he gets up to urinate x1  times per night. BOWEL: He reports a  bowel movement everyday having a normal bowel movement. Fatigue:None Appetite:Good eating three meals per day. Weight: Wt Readings from Last 3 Encounters:  08/13/16 243 lb 9.6 oz (110.5 kg)  07/07/16 247 lb 6.4 oz (112.2 kg)  07/03/16 243 lb (110.2 kg)   Urologist:Dr. Alinda Money Lupron injection: Last  dose  03-2016                                                         PSA:0.015 ng/ml Lupron injection every 6 months  BP (!) 142/87   Pulse 63   Temp 98.3 F (36.8 C) (Oral)   Resp 20   Ht 5\' 8"  (1.727 m)   Wt 243 lb 9.6 oz (110.5 kg)   SpO2 96%   BMI 37.04 kg/m

## 2016-08-11 DIAGNOSIS — Z7984 Long term (current) use of oral hypoglycemic drugs: Secondary | ICD-10-CM | POA: Diagnosis not present

## 2016-08-11 DIAGNOSIS — E119 Type 2 diabetes mellitus without complications: Secondary | ICD-10-CM | POA: Diagnosis not present

## 2016-08-11 DIAGNOSIS — I10 Essential (primary) hypertension: Secondary | ICD-10-CM | POA: Diagnosis not present

## 2016-08-13 ENCOUNTER — Ambulatory Visit
Admission: RE | Admit: 2016-08-13 | Discharge: 2016-08-13 | Disposition: A | Discharge status: Home / Self Care | Payer: Self-pay | Source: Ambulatory Visit | Attending: Radiation Oncology | Admitting: Radiation Oncology

## 2016-08-13 ENCOUNTER — Encounter: Payer: Self-pay | Admitting: Radiation Oncology

## 2016-08-13 VITALS — BP 142/87 | HR 63 | Temp 98.3°F | Resp 20 | Ht 68.0 in | Wt 243.6 lb

## 2016-08-13 DIAGNOSIS — C61 Malignant neoplasm of prostate: Secondary | ICD-10-CM | POA: Insufficient documentation

## 2016-08-13 DIAGNOSIS — Z923 Personal history of irradiation: Secondary | ICD-10-CM | POA: Insufficient documentation

## 2016-08-13 DIAGNOSIS — Z08 Encounter for follow-up examination after completed treatment for malignant neoplasm: Secondary | ICD-10-CM | POA: Insufficient documentation

## 2016-08-13 NOTE — Progress Notes (Signed)
  Radiation Oncology         [phone] ________________________________  Name: Travis Bowman MRN: [id number]  Date: 08/13/2016  DOB: 11/13/1960  Post Treatment Note  CC: Lujean Amel, MD  Raynelle Bring, MD  Diagnosis:   56 y.o. 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  Interval Since Last Radiation:  4 weeks   05/12/2016 to 07/08/2016:  1. The prostate initial pelvic nodes were treated to 45 Gy in 25 fractions at 1.8 Gy per fraction.  2. The prostate fossa was boosted to 23.4 Gy in 13 fractions at 1.8 Gy per fraction.   Narrative:  The patient returns today for routine follow-up.He's doing well since completing radiotherapy  On review of systems, the patient states he's doing well. Bowel and bladder activity are back to baseline prior to radiotherapy. He denies any hot flashes or night sweats. No other complaints are verbalized.  ALLERGIES:  is allergic to morphine and related.  Meds: Current Outpatient Prescriptions  Medication Sig Dispense Refill  . glimepiride (AMARYL) 2 MG tablet Take 2 mg by mouth daily with breakfast.    . Leuprolide Acetate, 6 Month, (LUPRON DEPOT, 5-MONTH, IM) Inject 1 Dose into the muscle every 6 (six) months.    [name] lisinopril (PRINIVIL,ZESTRIL) 40 MG tablet Take 1 tablet by mouth every evening.   3  . omeprazole (PRILOSEC) 40 MG capsule Take 40 mg by mouth every morning.    [name] VIAGRA 100 MG tablet Take 100 mg by mouth as needed.     No current facility-administered medications for this encounter.     Physical Findings:  height is 5\' 8"  (1.727 m) and weight is 243 lb 9.6 oz (110.5 kg). His oral temperature is 98.3 F (36.8 C). His blood pressure is 142/87 (abnormal) and his pulse is 63. His respiration is 20 and oxygen saturation is 96%.  Pain Assessment Pain Score: 3  (Right knee)/10 In general this is a well  appearing Caucasian male in no acute distress. He's alert and oriented x4 and appropriate throughout the examination. Cardiopulmonary assessment is negative for acute distress and he exhibits normal effort.   Lab Findings: Lab Results  Component Value Date   WBC 9.6 06/10/2016   HGB 15.0 06/10/2016   HCT 42.7 06/10/2016   MCV 86.8 06/10/2016   PLT 173 06/10/2016     Radiographic Findings: No results found.  Impression/Plan: 1. 56 y.o. 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 The patient appears to be doing well overall and recovering from the effects of radiotherapy. He will return in April to [facility] for his next Lupron injection. We would be happy to see him moving forward as needed, and he will contact us with questions or concerns regarding his previous treatment.      Carola Rhine, PAC

## 2016-08-15 ENCOUNTER — Telehealth: Payer: Self-pay | Admitting: Medical Oncology

## 2016-08-15 NOTE — Progress Notes (Signed)
Left message to follow up with pt post radiation. I was not able to see him 2/14 for his post radiation follow up visit with Shona Simpson, PA. I asked him to call me with any questions or concerns.

## 2016-08-27 DIAGNOSIS — G4733 Obstructive sleep apnea (adult) (pediatric): Secondary | ICD-10-CM | POA: Diagnosis not present

## 2016-09-10 ENCOUNTER — Encounter: Payer: Commercial Managed Care - HMO | Attending: Family Medicine | Admitting: *Deleted

## 2016-09-10 DIAGNOSIS — E119 Type 2 diabetes mellitus without complications: Secondary | ICD-10-CM | POA: Insufficient documentation

## 2016-09-10 DIAGNOSIS — Z713 Dietary counseling and surveillance: Secondary | ICD-10-CM | POA: Diagnosis not present

## 2016-09-10 NOTE — Patient Instructions (Signed)
Plan:  Aim for 3 Carb Choices per meal (45 grams) +/- 1 either way  Aim for 0-2 Carbs per snack if hungry  Include protein in moderation with your meals and snacks Consider reading food labels for Total Carbohydrate of foods Consider  increasing your activity level by going to gym for 30 minutes a few times a week as tolerated Consider checking BG at alternate times per day  Continue taking medication as directed by MD

## 2016-09-11 NOTE — Progress Notes (Signed)
Diabetes Self-Management Education  Visit Type: First/Initial  Appt. Start Time: 0830 Appt. End Time: 1000  09/11/2016  [name], identified by name and date of birth, is a 56 y.o. male with a diagnosis of Diabetes: Type 2. 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 He states recent history of prostate cancer with radiation therapy late last year. His diagnosis of diabetes initiated some significant changes in his eating habits and he has lost another 7.5 pounds in past month.   ASSESSMENT  Height 5\' 8"  (1.727 m), weight 238 lb 3.2 oz (108 kg). Body mass index is 36.22 kg/m.      Diabetes Self-Management Education - 09/10/16 0831      Visit Information   Visit Type First/Initial     Initial Visit   Diabetes Type Type 2   Are you currently following a meal plan? No   Are you taking your medications as prescribed? Yes   Date Diagnosed 2016     Health Coping   How would you rate your overall health? Good     Psychosocial Assessment   Patient Belief/Attitude about Diabetes Motivated to manage diabetes   Self-care barriers None   Other persons present Patient   Patient Concerns Nutrition/Meal planning;Glycemic Control;Weight Control   Special Needs None   How often do you need to have someone help you when you read instructions, pamphlets, or other written materials from your doctor or pharmacy? 1 - Never   What is the last grade level you completed in school? GED     Pre-Education Assessment   Patient understands the diabetes disease and treatment process. Needs Instruction   Patient understands incorporating nutritional management into lifestyle. Needs Instruction   Patient undertands incorporating physical activity into lifestyle. Needs Instruction   Patient understands using medications safely. Needs Review   Patient understands monitoring blood glucose, interpreting and using results Needs Review   Patient understands prevention, detection,  and treatment of acute complications. Needs Instruction   Patient understands prevention, detection, and treatment of chronic complications. Needs Instruction   Patient understands how to develop strategies to address psychosocial issues. Needs Instruction   Patient understands how to develop strategies to promote health/change behavior. Needs Instruction     Complications   Last HgB A1C per patient/outside source 6.5 %   How often do you check your blood sugar? 3-4 times/day   Fasting Blood glucose range (mg/dL) 180-200   Postprandial Blood glucose range (mg/dL) 130-179   Number of hypoglycemic episodes per month 0   Have you had a dilated eye exam in the past 12 months? Yes   Have you had a dental exam in the past 12 months? No   Are you checking your feet? Yes   How many days per week are you checking your feet? 7     Dietary Intake   Breakfast 2 eggxs, grits, occasionally meat   Snack (morning) no   Lunch cooks at station OR tuna and chick pea salad OR salad with lean meat added OR thin bread sandwich   Dinner meat, vegetables, (no bread or starch right now)   Beverage(s) water, black coffee, diet soda     Exercise   Exercise Type Light (walking / raking leaves)   How many days per week to you exercise? 2   How many minutes per day do you exercise? 30   Total minutes per week of exercise 60     Patient Education  Previous Diabetes Education No   Disease state  Definition of diabetes, type 1 and 2, and the diagnosis of diabetes;Factors that contribute to the development of diabetes   Nutrition management  Role of diet in the treatment of diabetes and the relationship between the three main macronutrients and blood glucose level;Food label reading, portion sizes and measuring food.;Carbohydrate counting   Physical activity and exercise  Role of exercise on diabetes management, blood pressure control and cardiac health.   Medications Reviewed patients medication for diabetes,  action, purpose, timing of dose and side effects.   Acute complications Taught treatment of hypoglycemia - the 15 rule.   Chronic complications Relationship between chronic complications and blood glucose control   Psychosocial adjustment Role of stress on diabetes     Individualized Goals (developed by patient)   Nutrition Follow meal plan discussed   Physical Activity Exercise 3-5 times per week   Medications take my medication as prescribed   Monitoring  test blood glucose pre and post meals as discussed     Post-Education Assessment   Patient understands the diabetes disease and treatment process. Demonstrates understanding / competency   Patient understands incorporating nutritional management into lifestyle. Demonstrates understanding / competency   Patient undertands incorporating physical activity into lifestyle. Demonstrates understanding / competency   Patient understands using medications safely. Demonstrates understanding / competency   Patient understands monitoring blood glucose, interpreting and using results Demonstrates understanding / competency   Patient understands prevention, detection, and treatment of acute complications. Demonstrates understanding / competency   Patient understands prevention, detection, and treatment of chronic complications. Demonstrates understanding / competency   Patient understands how to develop strategies to address psychosocial issues. Demonstrates understanding / competency   Patient understands how to develop strategies to promote health/change behavior. Demonstrates understanding / competency     Outcomes   Expected Outcomes Demonstrated interest in learning. Expect positive outcomes   Future DMSE PRN   Program Status Completed      Individualized Plan for Diabetes Self-Management Training:   Learning Objective:  Patient will have a greater understanding of diabetes self-management. Patient education plan is to attend individual  and/or group sessions per assessed needs and concerns.   Plan:   Patient Instructions  Plan:  Aim for 3 Carb Choices per meal (45 grams) +/- 1 either way  Aim for 0-2 Carbs per snack if hungry  Include protein in moderation with your meals and snacks Consider reading food labels for Total Carbohydrate of foods Consider  increasing your activity level by going to gym for 30 minutes a few times a week as tolerated Consider checking BG at alternate times per day  Continue taking medication as directed by MD  Expected Outcomes:  Demonstrated interest in learning. Expect positive outcomes  Education material provided: Living Well with Diabetes, A1C conversion sheet, Meal plan card and Carbohydrate counting sheet, Diabetes Medication Sheet  If problems or questions, patient to contact team via:  Phone and Email  Future DSME appointment: PRN

## 2016-09-25 DIAGNOSIS — G4733 Obstructive sleep apnea (adult) (pediatric): Secondary | ICD-10-CM | POA: Diagnosis not present

## 2016-09-29 DIAGNOSIS — G4733 Obstructive sleep apnea (adult) (pediatric): Secondary | ICD-10-CM | POA: Diagnosis not present

## 2016-10-01 DIAGNOSIS — C61 Malignant neoplasm of prostate: Secondary | ICD-10-CM | POA: Diagnosis not present

## 2016-10-08 DIAGNOSIS — C61 Malignant neoplasm of prostate: Secondary | ICD-10-CM | POA: Diagnosis not present

## 2016-10-26 DIAGNOSIS — G4733 Obstructive sleep apnea (adult) (pediatric): Secondary | ICD-10-CM | POA: Diagnosis not present

## 2016-11-25 DIAGNOSIS — G4733 Obstructive sleep apnea (adult) (pediatric): Secondary | ICD-10-CM | POA: Diagnosis not present

## 2016-12-17 DIAGNOSIS — E119 Type 2 diabetes mellitus without complications: Secondary | ICD-10-CM | POA: Diagnosis not present

## 2016-12-17 DIAGNOSIS — Z0000 Encounter for general adult medical examination without abnormal findings: Secondary | ICD-10-CM | POA: Diagnosis not present

## 2016-12-17 DIAGNOSIS — Z13220 Encounter for screening for lipoid disorders: Secondary | ICD-10-CM | POA: Diagnosis not present

## 2016-12-26 DIAGNOSIS — G4733 Obstructive sleep apnea (adult) (pediatric): Secondary | ICD-10-CM | POA: Diagnosis not present

## 2017-01-05 DIAGNOSIS — I10 Essential (primary) hypertension: Secondary | ICD-10-CM | POA: Diagnosis not present

## 2017-01-05 DIAGNOSIS — N055 Unspecified nephritic syndrome with diffuse mesangiocapillary glomerulonephritis: Secondary | ICD-10-CM | POA: Diagnosis not present

## 2017-01-07 DIAGNOSIS — G4733 Obstructive sleep apnea (adult) (pediatric): Secondary | ICD-10-CM | POA: Diagnosis not present

## 2017-01-25 DIAGNOSIS — G4733 Obstructive sleep apnea (adult) (pediatric): Secondary | ICD-10-CM | POA: Diagnosis not present

## 2017-02-06 DIAGNOSIS — I10 Essential (primary) hypertension: Secondary | ICD-10-CM | POA: Diagnosis not present

## 2017-02-06 DIAGNOSIS — Z961 Presence of intraocular lens: Secondary | ICD-10-CM | POA: Diagnosis not present

## 2017-02-06 DIAGNOSIS — H26492 Other secondary cataract, left eye: Secondary | ICD-10-CM | POA: Diagnosis not present

## 2017-02-25 DIAGNOSIS — G4733 Obstructive sleep apnea (adult) (pediatric): Secondary | ICD-10-CM | POA: Diagnosis not present

## 2017-03-28 DIAGNOSIS — G4733 Obstructive sleep apnea (adult) (pediatric): Secondary | ICD-10-CM | POA: Diagnosis not present

## 2017-04-01 DIAGNOSIS — C61 Malignant neoplasm of prostate: Secondary | ICD-10-CM | POA: Diagnosis not present

## 2017-04-06 ENCOUNTER — Other Ambulatory Visit: Payer: Self-pay | Admitting: Nurse Practitioner

## 2017-04-06 ENCOUNTER — Ambulatory Visit
Admission: RE | Admit: 2017-04-06 | Discharge: 2017-04-06 | Disposition: A | Discharge status: Home / Self Care | Payer: Worker's Compensation | Source: Ambulatory Visit | Attending: Nurse Practitioner | Admitting: Nurse Practitioner

## 2017-04-06 DIAGNOSIS — M79641 Pain in right hand: Secondary | ICD-10-CM

## 2017-04-08 DIAGNOSIS — C61 Malignant neoplasm of prostate: Secondary | ICD-10-CM | POA: Diagnosis not present

## 2017-04-16 DIAGNOSIS — G4733 Obstructive sleep apnea (adult) (pediatric): Secondary | ICD-10-CM | POA: Diagnosis not present

## 2017-04-27 DIAGNOSIS — G4733 Obstructive sleep apnea (adult) (pediatric): Secondary | ICD-10-CM | POA: Diagnosis not present

## 2017-06-18 DIAGNOSIS — E119 Type 2 diabetes mellitus without complications: Secondary | ICD-10-CM | POA: Diagnosis not present

## 2017-06-18 DIAGNOSIS — I10 Essential (primary) hypertension: Secondary | ICD-10-CM | POA: Diagnosis not present

## 2017-06-29 DIAGNOSIS — R42 Dizziness and giddiness: Secondary | ICD-10-CM | POA: Diagnosis not present

## 2017-08-31 DIAGNOSIS — G4733 Obstructive sleep apnea (adult) (pediatric): Secondary | ICD-10-CM | POA: Diagnosis not present

## 2017-09-11 DIAGNOSIS — G4733 Obstructive sleep apnea (adult) (pediatric): Secondary | ICD-10-CM | POA: Diagnosis not present

## 2017-10-07 DIAGNOSIS — G4733 Obstructive sleep apnea (adult) (pediatric): Secondary | ICD-10-CM | POA: Diagnosis not present

## 2017-10-14 DIAGNOSIS — C61 Malignant neoplasm of prostate: Secondary | ICD-10-CM | POA: Diagnosis not present

## 2017-10-23 DIAGNOSIS — C61 Malignant neoplasm of prostate: Secondary | ICD-10-CM | POA: Diagnosis not present

## 2017-10-27 ENCOUNTER — Encounter: Payer: Self-pay | Admitting: Oncology

## 2017-10-27 ENCOUNTER — Telehealth: Payer: Self-pay | Admitting: Oncology

## 2017-10-27 NOTE — Telephone Encounter (Signed)
Pt has been scheduled to see Dr. Alen Blew on 5/9 at 11am. Pt aware to arrive 30 minutes early. Letter mailed to the pt and faxed to the referring.

## 2017-11-03 ENCOUNTER — Encounter: Payer: Self-pay | Admitting: *Deleted

## 2017-11-04 ENCOUNTER — Telehealth: Payer: Self-pay | Admitting: Oncology

## 2017-11-04 ENCOUNTER — Inpatient Hospital Stay: Payer: Commercial Managed Care - HMO | Attending: Oncology | Admitting: Oncology

## 2017-11-04 VITALS — BP 146/85 | HR 63 | Temp 98.8°F | Resp 18 | Ht 68.0 in | Wt 238.0 lb

## 2017-11-04 DIAGNOSIS — C61 Malignant neoplasm of prostate: Secondary | ICD-10-CM

## 2017-11-04 DIAGNOSIS — Z79899 Other long term (current) drug therapy: Secondary | ICD-10-CM | POA: Insufficient documentation

## 2017-11-04 NOTE — Progress Notes (Signed)
Hematology and Oncology Follow Up Visit  Travis Bowman [id number] 1960-09-06 57 y.o. 11/04/2017 10:49 AM Koirala, [name], MDKoirala, [name], MD   Principle Diagnosis: 58 year old man with prostate cancer diagnosed in June 2017.  He was found to have Gleason score 4+5 = 9 and a PSA 4.59.  His pathological staging was T3a N1   Prior Therapy:   He is status post robotic-assisted laparoscopic radical prostatectomy and bilateral extended pelvic lymphadenectomy on 01/28/2016. The right posterior soft tissue margins were positive and 2 out of 12 lymph nodes on the right side involved with cancer. 0 out of 10 lymph nodes in the left side had any cancer involvement.  He is status post radiation therapy between November 2017 in January 2018.  He had 45 Gy to the prostate and pelvic lymph nodes with additional 23.4 Gy prostatic fossa boost.  He also completed 6 months of androgen deprivation.  His PSA remained undetectable until April 2019 which was 1.38   Current therapy: Active surveillance under consideration for additional therapy.  Interim History: Travis Bowman is here for a follow-up visit.  Since her last visit, he reports no major changes in his health.  He tolerated radiation therapy and androgen deprivation without complications.  He denies any hot flashes, weight gain or excessive fatigue.  He continues to perform activities of daily living without any decline.  Continues to work full-time as well.  He denies any urination difficulties including incontinence or nocturia.  Denies any bone pain or pathological fractures.  He does not report any headaches, blurry vision, syncope or seizures. He does not report any fevers or chills or sweats.  He denies any appetite changes or change in his performance status.  He does not report any cough or hemoptysis. He does not report any nausea, vomiting or abdominal pain.  He denies any hematochezia or melena.  He denies any frequency urgency, hematuria or dysuria.   He denies any arthralgias or myalgias.  He denies any lymphadenopathy or petechiae.  Remaining review of systems is negative.  Medications: I have reviewed the patient's current medications.  Current Outpatient Medications  Medication Sig Dispense Refill  . glimepiride (AMARYL) 2 MG tablet Take 2 mg by mouth daily with breakfast.    . Leuprolide Acetate, 6 Month, (LUPRON DEPOT, 39-MONTH, IM) Inject 1 Dose into the muscle every 6 (six) months.    [name] lisinopril (PRINIVIL,ZESTRIL) 40 MG tablet Take 1 tablet by mouth every evening.   3  . omeprazole (PRILOSEC) 40 MG capsule Take 40 mg by mouth every morning.    [name] VIAGRA 100 MG tablet Take 100 mg by mouth as needed.     No current facility-administered medications for this visit.      Allergies:  Allergies  Allergen Reactions  . Morphine And Related Nausea And Vomiting    Past Medical History, Surgical history, Social history, and Family History were reviewed and updated.  [name] Physical Exam: Blood pressure (!) 146/85, pulse 63, temperature 98.8 F (37.1 C), temperature source Oral, resp. rate 18, height 5\' 8"  (1.727 m), weight 238 lb (108 kg), SpO2 96 %.   ECOG: 0 General appearance: Well-appearing gentleman without distress. Head: Traumatic without abnormalities. Oropharynx: Without any thrush or ulcers. Eyes: No scleral icterus. Lymph nodes: No lymphadenopathy noted in the cervical, supraclavicular, and axillary nodes  Heart:regular rate and rhythm, without any murmurs or gallops. Lung: Clear to auscultation without any rhonchi or wheezes. Abdomin: soft, non-tender, with good bowel sounds.  No rebound or  guarding. Musculoskeletal: No joint deformity or effusion. Skin: No rashes or lesions.   Lab Results: Lab Results  Component Value Date   WBC 9.6 06/10/2016   HGB 15.0 06/10/2016   HCT 42.7 06/10/2016   MCV 86.8 06/10/2016   PLT 173 06/10/2016     Chemistry      Component Value Date/Time   NA 138 06/10/2016 1609   K  3.7 06/10/2016 1609   CL 111 06/10/2016 1609   CO2 23 06/10/2016 1609   BUN 28 (H) 06/10/2016 1609   CREATININE 1.66 (H) 06/10/2016 1609      Component Value Date/Time   CALCIUM 8.8 (L) 06/10/2016 1609   ALKPHOS 47 01/24/2016 1000   AST 31 01/24/2016 1000   ALT 39 01/24/2016 1000   BILITOT 0.6 01/24/2016 1000       Impression and Plan:   57 year old gentleman with:   1.  T3aN1 prostate cancer diagnosed in May 2017 with a Gleason score of 4+5 = 9 and PSA was 4.59.  He is status post radical prostatectomy followed by androgen deprivation and radiation therapy that concluded on January 2018.  He had undetectable PSA and tolerated therapy well.  His PSA in April 2018 showed increase to 1.38.   The natural course of this disease was reviewed today with the patient and his wife.  The rise in his PSA indicates biochemical relapse of his prostate cancer and potentially detectable disease that will require staging work-up.  The PSA rise could indicate local recurrence but more likely systemic recurrence.  For the management standpoint I recommended repeating his PSA and complete staging work-up with an Axumin PET to detect whether he has measurable disease.  Treatment approach at this time was discussed and these options would include continued observation and surveillance which I do not recommend.  Androgen deprivation therapy continuously would be my recommendation and additional therapy such as Zytiga or chemotherapy will be a consideration depending on his staging work-up.  If there is no detectable disease on his imaging studies, we can make an argument for androgen deprivation alone at that time.  He will return in the next 4 to 6 weeks after repeat PSA as well as completing his staging work-up.  All his questions were answered today to his satisfaction.  2. Incontinence: Resolved at this time.  3.  Follow-up: We will be in June 2019 for repeat PSA and discussed the results of his PET  imaging.  15  minutes was spent with the patient face-to-face today.  More than 50% of time was dedicated to patient counseling, education and coordination of his care.    Zola Button, MD 5/8/201910:49 AM

## 2017-11-04 NOTE — Telephone Encounter (Signed)
Scheduled app tper 5/8 los - Gave patient AVS and calender per los. Central radiology to contact patient with PET scan .

## 2017-11-27 ENCOUNTER — Telehealth: Payer: Self-pay | Admitting: Oncology

## 2017-11-27 NOTE — Telephone Encounter (Signed)
Faxed medical records to Dr. Alinda Money at [phone] on 11/27/17, Release ID: [id number]

## 2017-12-02 ENCOUNTER — Telehealth: Payer: Self-pay | Admitting: *Deleted

## 2017-12-02 ENCOUNTER — Encounter: Payer: Self-pay | Admitting: *Deleted

## 2017-12-02 NOTE — Telephone Encounter (Signed)
Patient set up for PET scan on 12/04/17, instructions given to patient, he wrote them down. No exercise 24 hours before, hydrate day before  NPO 6 hours prior to scan @ 3:00 pm

## 2017-12-02 NOTE — Telephone Encounter (Signed)
Not sure whether patient is getting the info that PET is not approved. PET has been authorized since 5/21.   April, Please help with scheduling.

## 2017-12-02 NOTE — Telephone Encounter (Signed)
Patient calling to inquire why his pet scan, due on 12/08/17, has not been approved yet? Will check with managed care.

## 2017-12-04 ENCOUNTER — Encounter (HOSPITAL_COMMUNITY)
Admission: RE | Admit: 2017-12-04 | Discharge: 2017-12-04 | Disposition: A | Discharge status: Home / Self Care | Payer: 59 | Source: Ambulatory Visit | Attending: Oncology | Admitting: Oncology

## 2017-12-04 DIAGNOSIS — C61 Malignant neoplasm of prostate: Secondary | ICD-10-CM | POA: Diagnosis not present

## 2017-12-04 MED ORDER — AXUMIN (FLUCICLOVINE F 18) INJECTION
8.7000 | Freq: Once | INTRAVENOUS | Status: AC
  Administered 2017-12-04: 8.7 via INTRAVENOUS

## 2017-12-08 ENCOUNTER — Inpatient Hospital Stay: Payer: 59 | Attending: Oncology

## 2017-12-08 DIAGNOSIS — C61 Malignant neoplasm of prostate: Secondary | ICD-10-CM | POA: Diagnosis present

## 2017-12-08 DIAGNOSIS — Z79899 Other long term (current) drug therapy: Secondary | ICD-10-CM | POA: Diagnosis not present

## 2017-12-08 LAB — CMP (CANCER CENTER ONLY)
ALT: 26 U/L (ref 0–55)
AST: 18 U/L (ref 5–34)
Albumin: 4 g/dL (ref 3.5–5.0)
Alkaline Phosphatase: 59 U/L (ref 40–150)
Anion gap: 9 (ref 3–11)
BUN: 16 mg/dL (ref 7–26)
CO2: 22 mmol/L (ref 22–29)
Calcium: 9.4 mg/dL (ref 8.4–10.4)
Chloride: 109 mmol/L (ref 98–109)
Creatinine: 0.83 mg/dL (ref 0.70–1.30)
GFR, Est AFR Am: >60 mL/min (ref >60)
GFR, Estimated: >60 mL/min (ref >60)
Glucose, Bld: 151 mg/dL — ABNORMAL HIGH (ref 70–140)
Potassium: 3.9 mmol/L (ref 3.5–5.1)
Sodium: 140 mmol/L (ref 136–145)
Total Bilirubin: 0.3 mg/dL (ref 0.2–1.2)
Total Protein: 6.9 g/dL (ref 6.4–8.3)

## 2017-12-08 LAB — CBC WITH DIFFERENTIAL (CANCER CENTER ONLY)
Basophils Absolute: 0 10*3/uL (ref 0.0–0.1)
Basophils Relative: 0 %
Eosinophils Absolute: 0.1 10*3/uL (ref 0.0–0.5)
Eosinophils Relative: 1 %
HCT: 42.2 % (ref 38.4–49.9)
Hemoglobin: 14.6 g/dL (ref 13.0–17.1)
Lymphocytes Relative: 17 %
Lymphs Abs: 1 10*3/uL (ref 0.9–3.3)
MCH: 31 pg (ref 27.2–33.4)
MCHC: 34.6 g/dL (ref 32.0–36.0)
MCV: 89.6 fL (ref 79.3–98.0)
Monocytes Absolute: 0.6 10*3/uL (ref 0.1–0.9)
Monocytes Relative: 11 %
Neutro Abs: 4 10*3/uL (ref 1.5–6.5)
Neutrophils Relative %: 71 %
Platelet Count: 167 10*3/uL (ref 140–400)
RBC: 4.71 MIL/uL (ref 4.20–5.82)
RDW: 13.8 % (ref 11.0–14.6)
WBC Count: 5.7 10*3/uL (ref 4.0–10.3)

## 2017-12-09 LAB — PROSTATE-SPECIFIC AG, SERUM (LABCORP): Prostate Specific Ag, Serum: 2.2 ng/mL (ref 0.0–4.0)

## 2017-12-10 ENCOUNTER — Inpatient Hospital Stay (HOSPITAL_BASED_OUTPATIENT_CLINIC_OR_DEPARTMENT_OTHER): Payer: 59 | Admitting: Oncology

## 2017-12-10 ENCOUNTER — Telehealth: Payer: Self-pay | Admitting: Oncology

## 2017-12-10 VITALS — BP 155/83 | HR 71 | Temp 98.6°F | Resp 18 | Ht 68.0 in | Wt 239.9 lb

## 2017-12-10 DIAGNOSIS — C61 Malignant neoplasm of prostate: Secondary | ICD-10-CM

## 2017-12-10 NOTE — Telephone Encounter (Signed)
Appointments scheduled AVS/Calendar printed per 6/13 los °

## 2017-12-10 NOTE — Progress Notes (Signed)
Hematology and Oncology Follow Up Visit  Travis Bowman [id number] October 16, 1960 57 y.o. 12/10/2017 3:23 PM Koirala, [name], MDKoirala, [name], MD   Principle Diagnosis: 57 year old man with biochemically recurring prostate cancer without any measurable disease.  He was initially diagnosed in June 2017 with Gleason score 4+5 = 9 and a PSA 4.59.     Prior Therapy:   He is status post robotic-assisted laparoscopic radical prostatectomy and bilateral extended pelvic lymphadenectomy on 01/28/2016. The right posterior soft tissue margins were positive and 2 out of 12 lymph nodes on the right side involved with cancer. 0 out of 10 lymph nodes in the left side had any cancer involvement.  He is status post radiation therapy between November 2017 in January 2018.  He had 45 Gy to the prostate and pelvic lymph nodes with additional 23.4 Gy prostatic fossa boost.  He also completed 6 months of androgen deprivation.  His PSA remained undetectable until April 2019 which was 1.38   Current therapy: Under consideration to start androgen deprivation therapy in the near future.  Interim History: Travis Bowman presents today for a follow-up.  Since last visit, he reports no major changes in his health and remains reasonably active.  He denies any arthralgias or myalgias.  He denies any excessive fatigue or tiredness.  He continues to attend to activities of daily living without any decline in ability to do so.  He is urinating freely without any incontinence or dysuria.  His energy and performance status remain unchanged.   He does not report any headaches, blurry vision, syncope or seizures.  He denies any alteration in mental status or confusion.  He does not report any fevers or chills or sweats.  Appetite and weight remained the same.  He denies any chest pain or palpitation or leg edema.  He does not report any cough or hemoptysis. He does not report any nausea, vomiting or abdominal pain.  He denies any hematochezia  or melena.  He denies any frequency urgency, hematuria or dysuria.  He denies any lymphadenopathy or petechiae.  Remaining review of systems is negative.  Medications: I have reviewed the patient's current medications.  Current Outpatient Medications  Medication Sig Dispense Refill  . glimepiride (AMARYL) 2 MG tablet Take 2 mg by mouth daily with breakfast.    . lisinopril (PRINIVIL,ZESTRIL) 40 MG tablet Take 1 tablet by mouth every evening.   3   No current facility-administered medications for this visit.      Allergies:  Allergies  Allergen Reactions  . Morphine And Related Nausea And Vomiting    Past Medical History, Surgical history, Social history, and Family History were reviewed and updated.  [name] Physical Exam: Blood pressure (!) 155/83, pulse 71, temperature 98.6 F (37 C), temperature source Oral, resp. rate 18, height 5\' 8"  (1.727 m), weight 239 lb 14.4 oz (108.8 kg), SpO2 96 %.    ECOG: 0 General appearance: Comfortable appearing gentleman without distress.. Head: Normocephalic without abnormalities. Oropharynx: Mucous membranes are moist and pink. Eyes: Pupils are equal and round reactive to light. Lymph nodes: No cervical, supraclavicular, inguinal or axillary nodes  Heart:regular rate and rhythm, S1 and S2 without any murmurs. Lung: Clear in all lung fields without any wheezes or dullness to percussion. Abdomin: soft, no rebound or guarding.  No shifting dullness or ascites. Musculoskeletal: No clubbing or cyanosis. Skin: No ecchymosis or petechiae.   Lab Results: Lab Results  Component Value Date   WBC 5.7 12/08/2017   HGB 14.6 12/08/2017  HCT 42.2 12/08/2017   MCV 89.6 12/08/2017   PLT 167 12/08/2017     Chemistry      Component Value Date/Time   NA 140 12/08/2017 0910   K 3.9 12/08/2017 0910   CL 109 12/08/2017 0910   CO2 22 12/08/2017 0910   BUN 16 12/08/2017 0910   CREATININE 0.83 12/08/2017 0910      Component Value Date/Time   CALCIUM  9.4 12/08/2017 0910   ALKPHOS 59 12/08/2017 0910   AST 18 12/08/2017 0910   ALT 26 12/08/2017 0910   BILITOT 0.3 12/08/2017 0910     EXAM: NUCLEAR MEDICINE PET SKULL BASE TO THIGH  TECHNIQUE: 8.7 mCi F-18 Fluciclovine was injected intravenously. Full-ring PET imaging was performed from the skull base to thigh after the radiotracer. CT data was obtained and used for attenuation correction and anatomic localization.  COMPARISON:  Bone scan 12/11/2015  FINDINGS: NECK  No radiotracer activity in neck lymph nodes.  Incidental CT finding: None  CHEST  No radiotracer accumulation within mediastinal or hilar lymph nodes. Noncalcified 6 mm nodule in the RIGHT upper lobe (image 69/4). 5 mm nodule in the LEFT lower lobe on image 91/4.  Incidental CT finding: Coronary artery calcification and aortic atherosclerotic calcification.  ABDOMEN/PELVIS  Prostate: No focal activity in the prostate bed.  Lymph nodes: No abnormal radiotracer accumulation within pelvic or abdominal nodes.  Liver: No evidence of liver metastasis  Incidental CT finding: None  SKELETON  No focal  activity to suggest skeletal metastasis.  IMPRESSION: 1. No evidence metastatic prostate cancer within pelvic lymph nodes or abdominal lymph nodes. 2. No evidence local recurrence in the prostate bed. 3. No evidence of distant soft tissue metastasis. 4. Several small bilateral pulmonary nodules. Non-contrast chest CT can be considered in 12 months. This recommendation follows the consensus statement: Guidelines for Management of Incidental Pulmonary Nodules Detected on CT Images: From the Fleischner Society 2017; Radiology 2017; 284:228-243. Results for Travis Bowman, Travis Bowman (MRN [id number]) as of 12/10/2017 15:22  Ref. Range 12/08/2017 09:10  Prostate Specific Ag, Serum Latest Ref Range: 0.0 - 4.0 ng/mL 2.2    Impression and Plan:   57 year old man with:   1.  Prostate cancer diagnosed in  May 2017 with Gleason score of 4+5 = 9 and PSA was 4.59.  He has developed biochemical recurrence after definitive therapy with surgery and salvage radiation therapy.  His PSA on 12/08/2017 was 2.2 which was 1.38 in April 2019.  His PET imaging was personally reviewed today and discussed with the patient and showed no evidence of metastatic disease noted at this time.  The natural course of this disease as well as treatment options were discussed at this time.  These options would include observation and surveillance, androgen deprivation therapy alone or androgen deprivation therapy with additional second line hormone therapy in the form of Zytiga or Xtandi.  The rationale for using systemic therapy at this point was discussed including risks and benefits.  After discussion today, he agreed to proceed with androgen deprivation therapy alone and defer any additional therapy to a later date.  Given the fact that he has no measurable disease using sensitive imaging study is reasonable to proceed with Lupron only.  Complication associated with this therapy was reviewed today.  This would include hot flashes, weight gain, sexual dysfunction among others.  He will proceed in the near future.  2.  Lower urinary tract symptoms: Appears to have resolved at this time.  We will continue  to monitor at this time.  3.  Bone health: No evidence of bony metastasis.  His risk of osteoporosis would be 6 increased given his androgen deprivation bone health will be addressed with him periodically.  4.  Follow-up: We will be in October 2019 for his second Lupron injection.  15  minutes was spent with the patient face-to-face today.  More than 50% of time was dedicated to patient counseling, education and seeing questions regarding future plan of care.    Zola Button, MD 6/13/20193:23 PM

## 2017-12-29 ENCOUNTER — Inpatient Hospital Stay: Payer: 59 | Attending: Oncology

## 2017-12-29 ENCOUNTER — Ambulatory Visit: Payer: Self-pay

## 2017-12-29 VITALS — BP 148/86 | HR 64 | Temp 98.8°F | Resp 18

## 2017-12-29 DIAGNOSIS — C61 Malignant neoplasm of prostate: Secondary | ICD-10-CM | POA: Diagnosis present

## 2017-12-29 DIAGNOSIS — Z5111 Encounter for antineoplastic chemotherapy: Secondary | ICD-10-CM | POA: Insufficient documentation

## 2017-12-29 MED ORDER — LEUPROLIDE ACETATE (4 MONTH) 30 MG IM KIT
30.0000 mg | PACK | Freq: Once | INTRAMUSCULAR | Status: AC
  Administered 2017-12-29: 30 mg via INTRAMUSCULAR
  Filled 2017-12-29: qty 30

## 2017-12-29 NOTE — Patient Instructions (Signed)

## 2018-01-05 DIAGNOSIS — E119 Type 2 diabetes mellitus without complications: Secondary | ICD-10-CM | POA: Diagnosis not present

## 2018-04-06 DIAGNOSIS — G4733 Obstructive sleep apnea (adult) (pediatric): Secondary | ICD-10-CM | POA: Diagnosis not present

## 2018-05-04 ENCOUNTER — Inpatient Hospital Stay: Payer: 59 | Attending: Oncology

## 2018-05-04 DIAGNOSIS — Z5111 Encounter for antineoplastic chemotherapy: Secondary | ICD-10-CM | POA: Insufficient documentation

## 2018-05-04 DIAGNOSIS — Z79899 Other long term (current) drug therapy: Secondary | ICD-10-CM | POA: Insufficient documentation

## 2018-05-04 DIAGNOSIS — C61 Malignant neoplasm of prostate: Secondary | ICD-10-CM | POA: Insufficient documentation

## 2018-05-04 LAB — CBC WITH DIFFERENTIAL (CANCER CENTER ONLY)
Abs Immature Granulocytes: 0.04 10*3/uL (ref 0.00–0.07)
Basophils Absolute: 0 10*3/uL (ref 0.0–0.1)
Basophils Relative: 0 %
Eosinophils Absolute: 0.1 10*3/uL (ref 0.0–0.5)
Eosinophils Relative: 2 %
HCT: 42.3 % (ref 39.0–52.0)
Hemoglobin: 14.3 g/dL (ref 13.0–17.0)
Immature Granulocytes: 1 %
Lymphocytes Relative: 18 %
Lymphs Abs: 1.4 10*3/uL (ref 0.7–4.0)
MCH: 30.8 pg (ref 26.0–34.0)
MCHC: 33.8 g/dL (ref 30.0–36.0)
MCV: 91.2 fL (ref 80.0–100.0)
Monocytes Absolute: 0.7 10*3/uL (ref 0.1–1.0)
Monocytes Relative: 9 %
Neutro Abs: 5.7 10*3/uL (ref 1.7–7.7)
Neutrophils Relative %: 70 %
Platelet Count: 190 10*3/uL (ref 150–400)
RBC: 4.64 MIL/uL (ref 4.22–5.81)
RDW: 12.9 % (ref 11.5–15.5)
WBC Count: 8 10*3/uL (ref 4.0–10.5)
nRBC: 0 % (ref 0.0–0.2)

## 2018-05-04 LAB — CMP (CANCER CENTER ONLY)
ALT: 33 U/L (ref 0–44)
AST: 24 U/L (ref 15–41)
Albumin: 4 g/dL (ref 3.5–5.0)
Alkaline Phosphatase: 58 U/L (ref 38–126)
Anion gap: 9 (ref 5–15)
BUN: 20 mg/dL (ref 6–20)
CO2: 25 mmol/L (ref 22–32)
Calcium: 9.6 mg/dL (ref 8.9–10.3)
Chloride: 106 mmol/L (ref 98–111)
Creatinine: 1.03 mg/dL (ref 0.61–1.24)
GFR, Est AFR Am: >60 mL/min (ref >60)
GFR, Estimated: >60 mL/min (ref >60)
Glucose, Bld: 150 mg/dL — ABNORMAL HIGH (ref 70–99)
Potassium: 4.9 mmol/L (ref 3.5–5.1)
Sodium: 140 mmol/L (ref 135–145)
Total Bilirubin: <0.2 mg/dL — ABNORMAL LOW (ref 0.3–1.2)
Total Protein: 7.1 g/dL (ref 6.5–8.1)

## 2018-05-05 ENCOUNTER — Telehealth: Payer: Self-pay | Admitting: Oncology

## 2018-05-05 ENCOUNTER — Inpatient Hospital Stay (HOSPITAL_BASED_OUTPATIENT_CLINIC_OR_DEPARTMENT_OTHER): Payer: 59 | Admitting: Oncology

## 2018-05-05 VITALS — BP 146/74 | HR 62 | Temp 97.8°F | Resp 17 | Ht 68.0 in | Wt 242.3 lb

## 2018-05-05 DIAGNOSIS — C61 Malignant neoplasm of prostate: Secondary | ICD-10-CM

## 2018-05-05 DIAGNOSIS — Z5111 Encounter for antineoplastic chemotherapy: Secondary | ICD-10-CM | POA: Diagnosis not present

## 2018-05-05 LAB — PROSTATE-SPECIFIC AG, SERUM (LABCORP): Prostate Specific Ag, Serum: 0.2 ng/mL (ref 0.0–4.0)

## 2018-05-05 MED ORDER — LEUPROLIDE ACETATE (4 MONTH) 30 MG IM KIT
30.0000 mg | PACK | Freq: Once | INTRAMUSCULAR | Status: AC
  Administered 2018-05-05: 30 mg via INTRAMUSCULAR
  Filled 2018-05-05: qty 30

## 2018-05-05 NOTE — Telephone Encounter (Signed)
ppts scheduled avs declined/ calendar printed per 11/6 los

## 2018-05-05 NOTE — Patient Instructions (Signed)

## 2018-05-05 NOTE — Progress Notes (Signed)
Hematology and Oncology Follow Up Visit  Travis Bowman [id number] 04-Feb-1961 57 y.o. 05/05/2018 8:16 AM Koirala, [name], MDKoirala, [name], MD   Principle Diagnosis: 57 year old man with prostate cancer diagnosed in June 2017 with Gleason score 4+5 = 9 and a PSA 4.59.  He has biochemically recurrent disease documented in 2019.   Prior Therapy:   He is status post robotic-assisted laparoscopic radical prostatectomy and bilateral extended pelvic lymphadenectomy on 01/28/2016. The right posterior soft tissue margins were positive and 2 out of 12 lymph nodes on the right side involved with cancer. 0 out of 10 lymph nodes in the left side had any cancer involvement.  He is status post radiation therapy between November 2017 in January 2018.  He had 45 Gy to the prostate and pelvic lymph nodes with additional 23.4 Gy prostatic fossa boost.  He also completed 6 months of androgen deprivation.  His PSA remained undetectable until April 2019 which was 1.38   Current therapy: Lupron 30 mg every 4 months started in July 2019.  Interim History: [name] is here for repeat evaluation.  Since the last visit, he received Lupron without any major complications.  He tolerated the injection with issues of hot flashes that has been manageable.  He denies any excessive fatigue, tiredness or weight loss.  He remains active and continues to attend activities of daily living.  He denies any worsening bone pain or urinary difficulties.  His quality of life and performance status is unchanged.   He does not report any headaches, blurry vision, syncope or seizures.  He denies any dizziness or lethargy.  He does not report any fevers or chills or sweats.  He denies any chest pain or palpitation or leg edema.  He does not report any cough or hemoptysis. He does not report any nausea, vomiting or abdominal pain.  He denies any changes in his bowel habits.  He denies any hematochezia or melena.  He denies any frequency  urgency, hematuria or dysuria.  He denies any bleeding or clotting tendency.  He denies any bone pain or pathological fractures.  He denies any mood changes.  Remaining review of systems is negative.  Medications: I have reviewed the patient's current medications.  Current Outpatient Medications  Medication Sig Dispense Refill  . glimepiride (AMARYL) 2 MG tablet Take 2 mg by mouth daily with breakfast.    . lisinopril (PRINIVIL,ZESTRIL) 40 MG tablet Take 1 tablet by mouth every evening.   3   No current facility-administered medications for this visit.      Allergies:  Allergies  Allergen Reactions  . Morphine And Related Nausea And Vomiting    Past Medical History, Surgical history, Social history, and Family History were reviewed and updated.  [name] Physical Exam:  Blood pressure (!) 146/74, pulse 62, temperature 97.8 F (36.6 C), temperature source Oral, resp. rate 17, height 5\' 8"  (1.727 m), weight 242 lb 4.8 oz (109.9 kg), SpO2 100 %.    ECOG: 0   General appearance: Alert, awake without any distress. Head: Atraumatic without abnormalities Oropharynx: Without any thrush or ulcers. Eyes: No scleral icterus. Lymph nodes: No lymphadenopathy noted in the cervical, supraclavicular, or axillary nodes Heart:regular rate and rhythm, without any murmurs or gallops.   Lung: Clear to auscultation without any rhonchi, wheezes or dullness to percussion. Abdomin: Soft, nontender without any shifting dullness or ascites. Musculoskeletal: No clubbing or cyanosis. Neurological: No motor or sensory deficits. Skin: No rashes or lesions. .   Lab Results:  Lab Results  Component Value Date   WBC 8.0 05/04/2018   HGB 14.3 05/04/2018   HCT 42.3 05/04/2018   MCV 91.2 05/04/2018   PLT 190 05/04/2018     Chemistry      Component Value Date/Time   NA 140 05/04/2018 0746   K 4.9 05/04/2018 0746   CL 106 05/04/2018 0746   CO2 25 05/04/2018 0746   BUN 20 05/04/2018 0746   CREATININE  1.03 05/04/2018 0746      Component Value Date/Time   CALCIUM 9.6 05/04/2018 0746   ALKPHOS 58 05/04/2018 0746   AST 24 05/04/2018 0746   ALT 33 05/04/2018 0746   BILITOT <0.2 (L) 05/04/2018 0746     Results for Travis, Bowman (MRN [id number]) as of 05/05/2018 07:47  Ref. Range 12/08/2017 09:10 05/04/2018 07:46  Prostate Specific Ag, Serum Latest Ref Range: 0.0 - 4.0 ng/mL 2.2 0.2    Impression and Plan:   57 year old man with:   1.  Prostate cancer with biochemical relapse documented in 2019.  He was initially diagnosed in May 2017 with Gleason score of 4+5 = 9 and PSA was 4.59.   He is currently on Lupron and has tolerated therapy very well.  His PSA has decreased down to 0.2 from 2.2.  Risks and benefits of continuing Lupron for the time being was discussed is agreeable to continue.  Long-term complications associated with this therapy including osteoporosis, sexual dysfunction among others.  We will continue to monitor his PSA periodically and consider intermittent androgen deprivation if he has any issues.  2.  Lower urinary tract symptoms: None reported at this time after the start of Lupron.  3.  Bone health: I have recommended calcium and vitamin D supplements and consideration for Prolia in the future.  4.  Follow-up: We will be in 4 months to follow his progress and next Lupron injection.  15  minutes was spent with the patient face-to-face today.  More than 50% of time was dedicated to reviewing his disease status, treatment options and managing complications related to therapy.    Zola Button, MD 11/6/20198:16 AM

## 2018-06-10 DIAGNOSIS — H524 Presbyopia: Secondary | ICD-10-CM | POA: Diagnosis not present

## 2018-07-07 IMAGING — US US ABDOMEN COMPLETE W/ ELASTOGRAPHY
1 series · 13 of 14 positions shown · non-contrast
Comparison: 07/10/2014

CLINICAL DATA: Gastroesophageal reflux without esophagitis.
Epigastric pain. Chronic viral hepatitis [place].



[Series 1: us abdomen complete w/ elastography · 0.19mm/px · 13 of 14 slices shown]
[im 1/14]
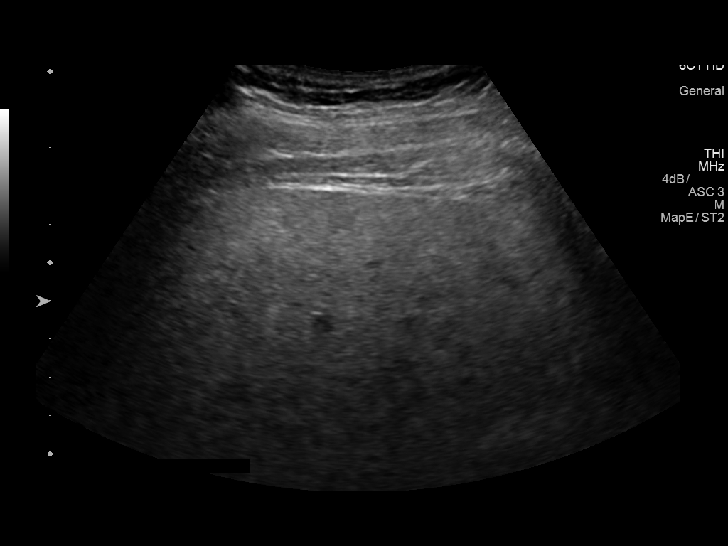
[im 2/14]
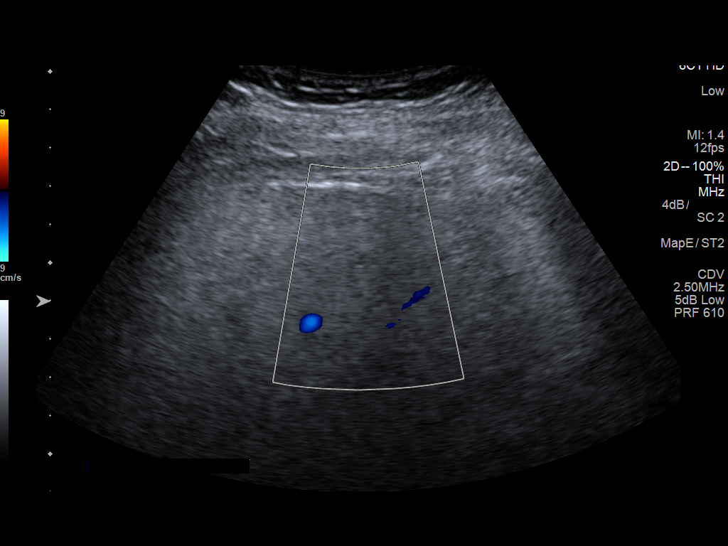
[im 3/14]
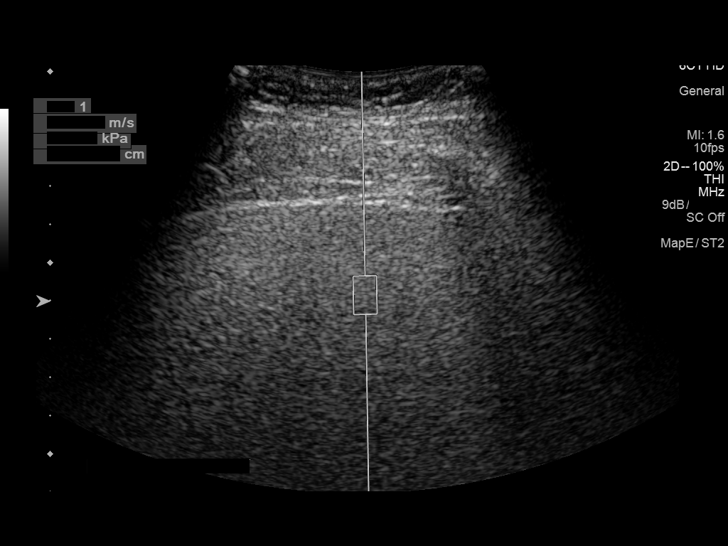
[im 4/14]
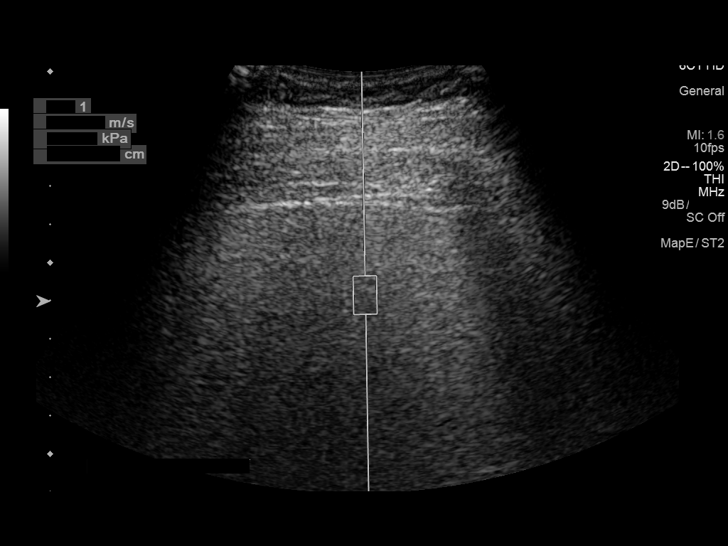
[im 5/14]
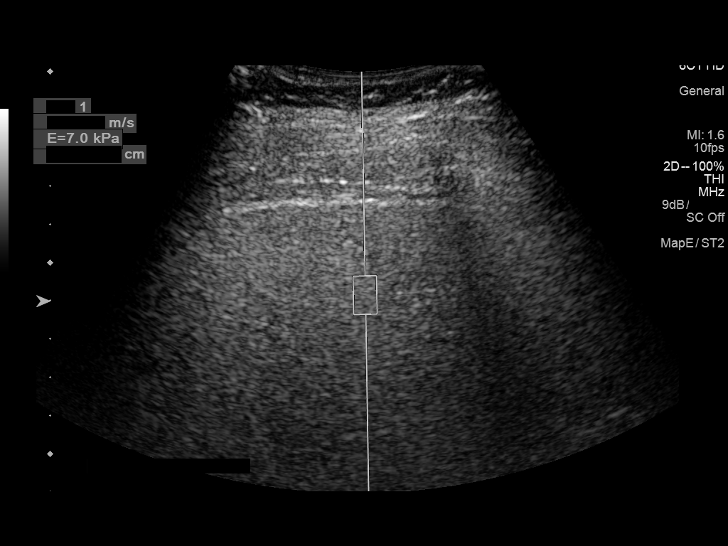
[im 6/14]
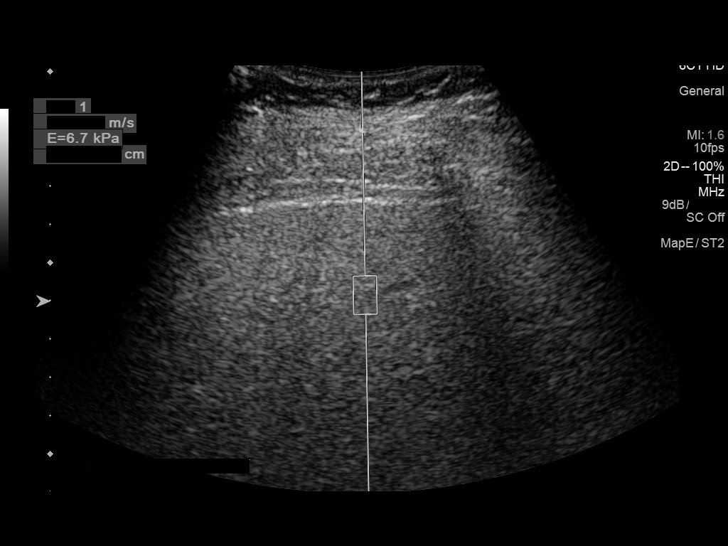
[im 8/14]
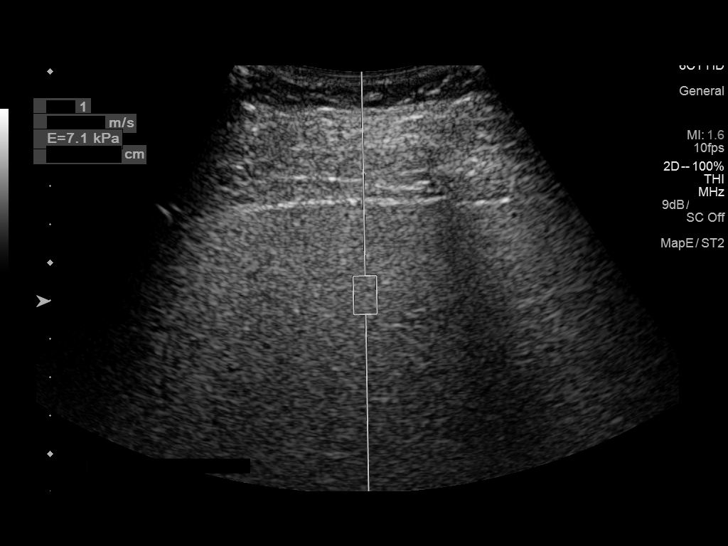
[im 9/14]
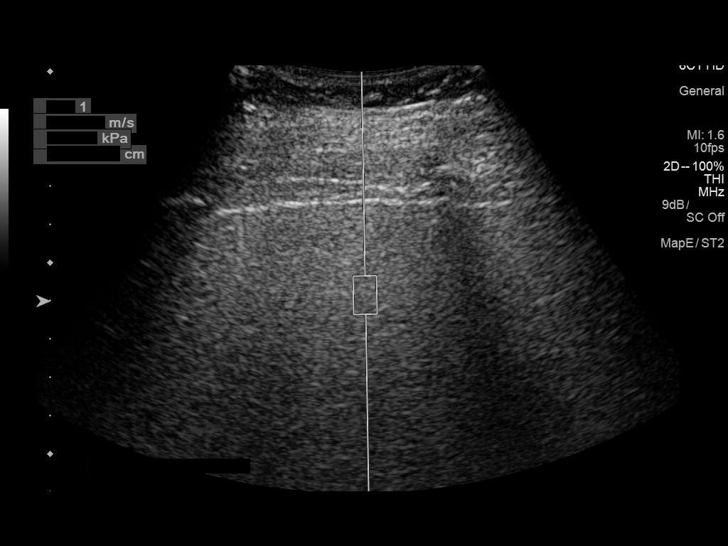
[im 10/14]
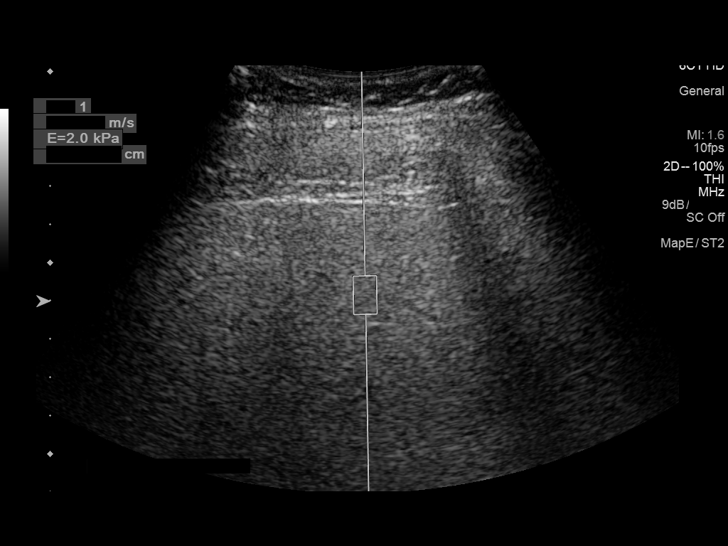
[im 11/14]
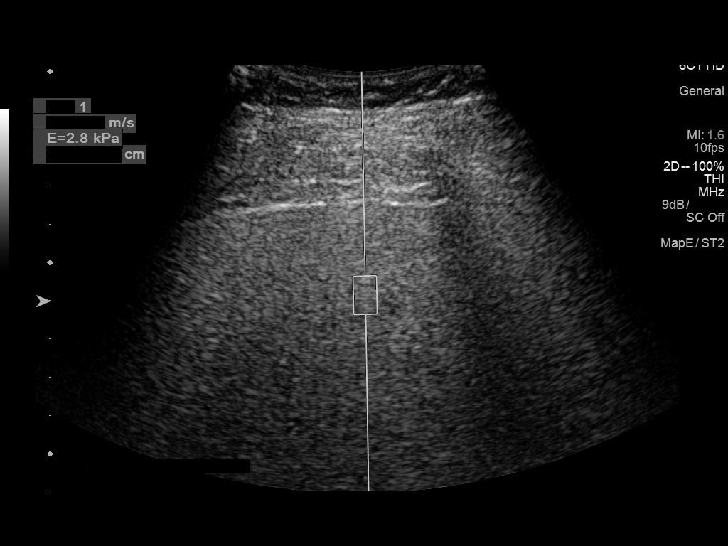
[im 12/14]
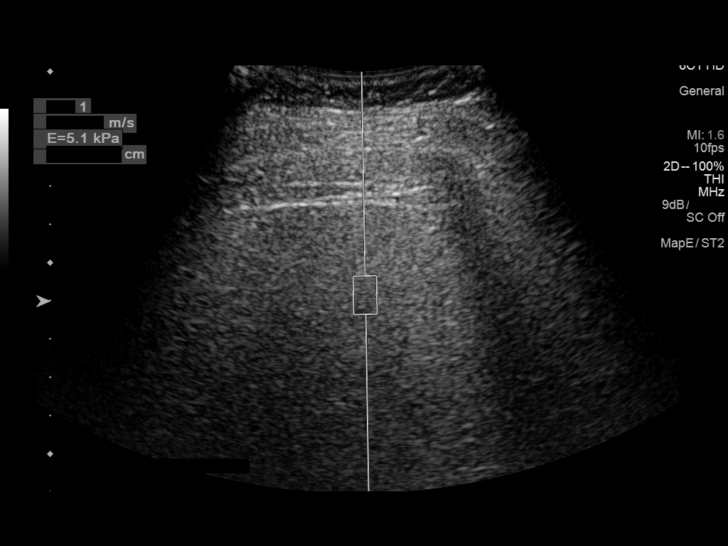
[im 13/14]
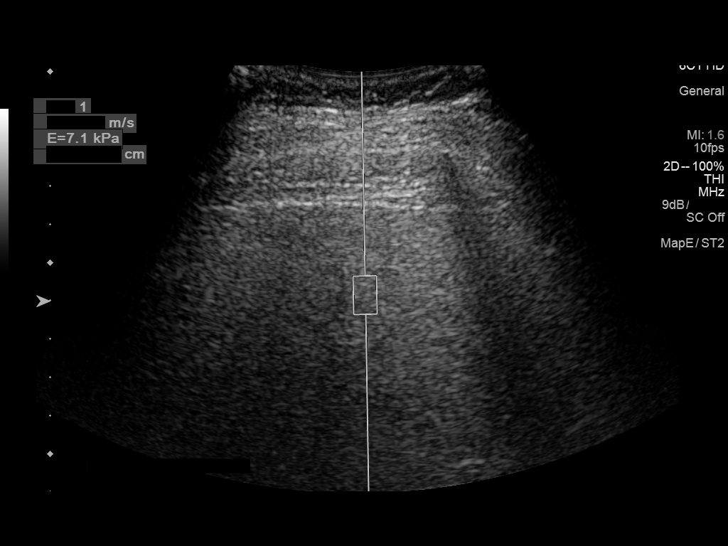
[im 14/14]
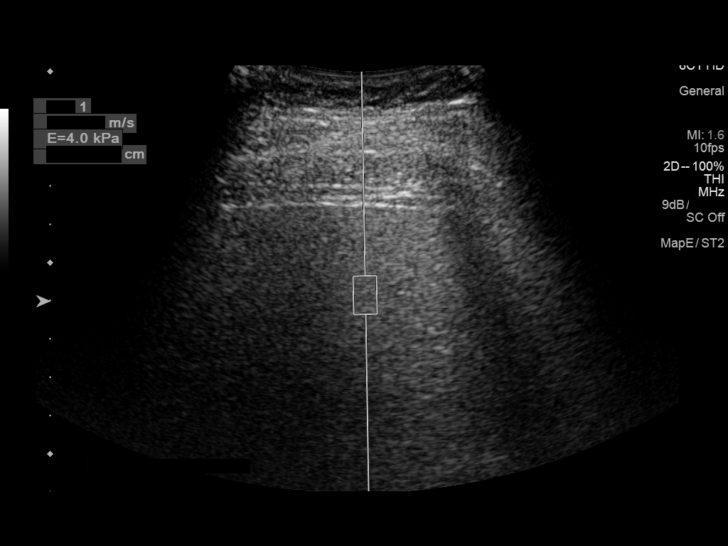

[13 of 14 positions shown; findings below may reference images not displayed]

FINDINGS: ULTRASOUND ABDOMEN

Gallbladder: 6 mm gallbladder polyp, non shadowing, image 10. No
gallbladder wall thickening. Sonographic Murphy's sign absent.

Common bile duct: Diameter: 9 mm

Liver: No focal lesion identified.  Coarse echotexture, echogenic.

IVC: Visualized portion unremarkable.

Pancreas: Not well seen due to overlying bowel gas.

Spleen: Size and appearance within normal limits.

Right Kidney: Length: 13.1 cm. The patient has a known 1 cm right
kidney upper pole cyst which is only faintly seen on today' s
ultrasound.

Left Kidney: Length: 13.2 cm. Echogenicity within normal limits. No
mass or hydronephrosis visualized.

Abdominal aorta: No aneurysm visualized.

Other findings: None.

ULTRASOUND HEPATIC ELASTOGRAPHY

Device: Siemens Helix VTQ

Patient position: Left Lateral Decubitus

Transducer [id number]

Number of measurements: 10

Hepatic segment:  8

Median velocity:   1.52  m/sec

IQR:

IQR/Median velocity ratio:

Corresponding Metavir fibrosis score:  F2 + some F3

Risk of fibrosis: Moderate

Limitations of exam: None

Pertinent findings noted on other imaging exams:  None

Please note that abnormal shear wave velocities may also be
identified in clinical settings other than with hepatic fibrosis,
such as: acute hepatitis, elevated right heart and central venous
pressures including use of beta blockers, Omae disease
([place]), infiltrative processes such as
mastocytosis/amyloidosis/infiltrative tumor, extrahepatic
cholestasis, in the post-prandial state, and liver transplantation.
Correlation with patient history, laboratory data, and clinical
condition recommended.
IMPRESSION: ULTRASOUND ABDOMEN:

1. 6 mm gallbladder polyp, may warrant periodic sonographic
surveillance.
2. Dilated common bile duct at 9 mm, cause uncertain, but fairly
similar compared back to the MRCP of 3697.
3. Coarse and echogenic liver likely from fibrosis and/or steatosis.

ULTRASOUND HEPATIC ELASTOGRAPHY:

Median hepatic shear wave velocity is calculated at 1.52 m/sec.

Corresponding Metavir fibrosis score is  F2 + some F3.

Risk of fibrosis is moderate.

Follow-up: Additional testing appropriate

## 2018-09-01 ENCOUNTER — Ambulatory Visit: Payer: Self-pay | Admitting: Oncology

## 2018-09-01 ENCOUNTER — Ambulatory Visit: Payer: Self-pay

## 2018-09-01 ENCOUNTER — Inpatient Hospital Stay: Payer: 59 | Attending: Oncology

## 2018-09-01 DIAGNOSIS — Z5111 Encounter for antineoplastic chemotherapy: Secondary | ICD-10-CM | POA: Diagnosis not present

## 2018-09-01 DIAGNOSIS — C61 Malignant neoplasm of prostate: Secondary | ICD-10-CM | POA: Diagnosis not present

## 2018-09-01 LAB — CMP (CANCER CENTER ONLY)
ALT: 41 U/L (ref 0–44)
AST: 28 U/L (ref 15–41)
Albumin: 3.5 g/dL (ref 3.5–5.0)
Alkaline Phosphatase: 72 U/L (ref 38–126)
Anion gap: 9 (ref 5–15)
BUN: 19 mg/dL (ref 6–20)
CO2: 23 mmol/L (ref 22–32)
Calcium: 8.8 mg/dL — ABNORMAL LOW (ref 8.9–10.3)
Chloride: 107 mmol/L (ref 98–111)
Creatinine: 0.93 mg/dL (ref 0.61–1.24)
GFR, Est AFR Am: >60 mL/min (ref >60)
GFR, Estimated: >60 mL/min (ref >60)
Glucose, Bld: 275 mg/dL — ABNORMAL HIGH (ref 70–99)
Potassium: 4.1 mmol/L (ref 3.5–5.1)
Sodium: 139 mmol/L (ref 135–145)
Total Bilirubin: 0.4 mg/dL (ref 0.3–1.2)
Total Protein: 6.7 g/dL (ref 6.5–8.1)

## 2018-09-01 LAB — CBC WITH DIFFERENTIAL (CANCER CENTER ONLY)
Abs Immature Granulocytes: 0.06 10*3/uL (ref 0.00–0.07)
Basophils Absolute: 0 10*3/uL (ref 0.0–0.1)
Basophils Relative: 0 %
Eosinophils Absolute: 0.1 10*3/uL (ref 0.0–0.5)
Eosinophils Relative: 1 %
HCT: 39.5 % (ref 39.0–52.0)
Hemoglobin: 13.5 g/dL (ref 13.0–17.0)
Immature Granulocytes: 1 %
Lymphocytes Relative: 14 %
Lymphs Abs: 1 10*3/uL (ref 0.7–4.0)
MCH: 30.1 pg (ref 26.0–34.0)
MCHC: 34.2 g/dL (ref 30.0–36.0)
MCV: 88.2 fL (ref 80.0–100.0)
Monocytes Absolute: 0.6 10*3/uL (ref 0.1–1.0)
Monocytes Relative: 8 %
Neutro Abs: 5.4 10*3/uL (ref 1.7–7.7)
Neutrophils Relative %: 76 %
Platelet Count: 172 10*3/uL (ref 150–400)
RBC: 4.48 MIL/uL (ref 4.22–5.81)
RDW: 12.9 % (ref 11.5–15.5)
WBC Count: 7.1 10*3/uL (ref 4.0–10.5)
nRBC: 0 % (ref 0.0–0.2)

## 2018-09-02 LAB — PROSTATE-SPECIFIC AG, SERUM (LABCORP): Prostate Specific Ag, Serum: 0.3 ng/mL (ref 0.0–4.0)

## 2018-09-03 ENCOUNTER — Telehealth: Payer: Self-pay | Admitting: Oncology

## 2018-09-03 ENCOUNTER — Inpatient Hospital Stay: Payer: 59

## 2018-09-03 ENCOUNTER — Inpatient Hospital Stay (HOSPITAL_BASED_OUTPATIENT_CLINIC_OR_DEPARTMENT_OTHER): Payer: 59 | Admitting: Oncology

## 2018-09-03 VITALS — BP 155/81 | HR 65 | Temp 98.2°F | Resp 17 | Ht 68.0 in | Wt 255.2 lb

## 2018-09-03 DIAGNOSIS — E291 Testicular hypofunction: Secondary | ICD-10-CM

## 2018-09-03 DIAGNOSIS — C61 Malignant neoplasm of prostate: Secondary | ICD-10-CM | POA: Diagnosis not present

## 2018-09-03 DIAGNOSIS — Z5111 Encounter for antineoplastic chemotherapy: Secondary | ICD-10-CM | POA: Diagnosis not present

## 2018-09-03 DIAGNOSIS — R61 Generalized hyperhidrosis: Secondary | ICD-10-CM

## 2018-09-03 DIAGNOSIS — R5383 Other fatigue: Secondary | ICD-10-CM

## 2018-09-03 MED ORDER — LEUPROLIDE ACETATE (4 MONTH) 30 MG IM KIT
30.0000 mg | PACK | Freq: Once | INTRAMUSCULAR | Status: AC
  Administered 2018-09-03: 30 mg via INTRAMUSCULAR
  Filled 2018-09-03: qty 30

## 2018-09-03 NOTE — Progress Notes (Signed)
Hematology and Oncology Follow Up Visit  Travis Bowman [id number] 11-Oct-1960 58 y.o. 09/03/2018 9:24 AM Koirala, [name], MDKoirala, [name], MD   Principle Diagnosis: 58 year old man with prostate cancer with currently biochemical relapse diagnosed in 2019.  He was initially diagnosed in June 2017 with Gleason score 4+5 = 9 and a PSA 4.59.   Prior Therapy:   He is status post robotic-assisted laparoscopic radical prostatectomy and bilateral extended pelvic lymphadenectomy on 01/28/2016. The right posterior soft tissue margins were positive and 2 out of 12 lymph nodes on the right side involved with cancer. 0 out of 10 lymph nodes in the left side had any cancer involvement.  He is status post radiation therapy between November 2017 in January 2018.  He had 45 Gy to the prostate and pelvic lymph nodes with additional 23.4 Gy prostatic fossa boost.  He also completed 6 months of androgen deprivation.  His PSA remained undetectable until April 2019 which was 1.38   Current therapy: Lupron 30 mg every 4 months started in July 2019.  Interim History: [name] is here for a repeat evaluation.  Since the last visit, he reports no major changes in his health.  He tolerated Lupron without any major complications.  He has some hot flashes and mild fatigue that are manageable.  He denies any excessive fatigue or tiredness but does report some mild tiredness at the end of the day.  He is able to attend activities of daily living without any hindrance or decline.  He denies any bone pain or pathological fractures.  He denies any urinary difficulties.  Patient denied any alteration mental status, neuropathy, confusion or dizziness.  Denies any headaches or lethargy.  Denies any night sweats, weight loss or changes in appetite.  Denied orthopnea, dyspnea on exertion or chest discomfort.  Denies shortness of breath, difficulty breathing hemoptysis or cough.  Denies any abdominal distention, nausea, early satiety or  dyspepsia.  Denies any hematuria, frequency, dysuria or nocturia.  Denies any skin irritation, dryness or rash.  Denies any ecchymosis or petechiae.  Denies any lymphadenopathy or clotting.  Denies any heat or cold intolerance.  Denies any anxiety or depression.  Remaining review of system is negative.  .  Medications: I have reviewed the patient's current medications.  Current Outpatient Medications  Medication Sig Dispense Refill  . glimepiride (AMARYL) 2 MG tablet Take 2 mg by mouth daily with breakfast.    . lisinopril (PRINIVIL,ZESTRIL) 40 MG tablet Take 1 tablet by mouth every evening.   3   No current facility-administered medications for this visit.      Allergies:  Allergies  Allergen Reactions  . Morphine And Related Nausea And Vomiting    Past Medical History, Surgical history, Social history, and Family History were reviewed and updated.  [name] Physical Exam:  Blood pressure (!) 155/81, pulse 65, temperature 98.2 F (36.8 C), temperature source Oral, resp. rate 17, height 5\' 8"  (1.727 m), weight 255 lb 3.2 oz (115.8 kg), SpO2 98 %.     ECOG: 0   General appearance: Comfortable appearing without any discomfort Head: Normocephalic without any trauma Oropharynx: Mucous membranes are moist and pink without any thrush or ulcers. Eyes: Pupils are equal and round reactive to light. Lymph nodes: No cervical, supraclavicular, inguinal or axillary lymphadenopathy.   Heart:regular rate and rhythm.  S1 and S2 without leg edema. Lung: Clear without any rhonchi or wheezes.  No dullness to percussion. Abdomin: Soft, nontender, nondistended with good bowel sounds.  No hepatosplenomegaly. Musculoskeletal: No joint deformity or effusion.  Full range of motion noted. Neurological: No deficits noted on motor, sensory and deep tendon reflex exam. Skin: No petechial rash or dryness.  Appeared moist.   .   Lab Results: Lab Results  Component Value Date   WBC 7.1 09/01/2018   HGB  13.5 09/01/2018   HCT 39.5 09/01/2018   MCV 88.2 09/01/2018   PLT 172 09/01/2018     Chemistry      Component Value Date/Time   NA 139 09/01/2018 0755   K 4.1 09/01/2018 0755   CL 107 09/01/2018 0755   CO2 23 09/01/2018 0755   BUN 19 09/01/2018 0755   CREATININE 0.93 09/01/2018 0755      Component Value Date/Time   CALCIUM 8.8 (L) 09/01/2018 0755   ALKPHOS 72 09/01/2018 0755   AST 28 09/01/2018 0755   ALT 41 09/01/2018 0755   BILITOT 0.4 09/01/2018 0755      Results for [name], Bowman (MRN [id number]) as of 09/03/2018 08:50  Ref. Range 05/04/2018 07:46 09/01/2018 07:55  Prostate Specific Ag, Serum Latest Ref Range: 0.0 - 4.0 ng/mL 0.2 0.3    Impression and Plan:   58 year old man with:   1.  Prostate cancer diagnosed in 2017 and underwent prostatectomy.  He developed biochemical relapse in 2019 without any measurable disease.   He remains on Lupron without any major complications.  His PSA had an initial response with decline to 0.2.  His PSA on September 01, 2018 is 0.3.  The natural course of this disease and treatment options were reiterated today.  Risks and benefits of continuing androgen deprivation therapy was discussed.  Additional therapy in the form of Zytiga, Xtandi or systemic chemotherapy will also discussed if he develops castration resistant disease.  He is agreeable to to proceed with this plan.  2.  Lower urinary tract symptoms: No issues results at this time.  3.  Bone health: He is at risk of osteoporosis related to androgen deprivation.  I recommended calcium and vitamin D supplements.  4.  Follow-up: Repeat evaluation will be in 4 months and Lupron injection.  15  minutes was spent with the patient face-to-face today.  More than 50% of time was dedicated to updating the natural course of his disease, laboratory data review and discussing treatment options.    Zola Button, MD 3/6/20209:24 AM

## 2018-09-03 NOTE — Patient Instructions (Signed)

## 2018-09-03 NOTE — Telephone Encounter (Signed)
Scheduled appt per 3/6 los.  Printed calendar

## 2018-09-27 DIAGNOSIS — G4733 Obstructive sleep apnea (adult) (pediatric): Secondary | ICD-10-CM | POA: Diagnosis not present

## 2018-10-07 NOTE — Telephone Encounter (Signed)
Opened in error

## 2018-11-05 DIAGNOSIS — I10 Essential (primary) hypertension: Secondary | ICD-10-CM | POA: Diagnosis not present

## 2018-11-05 DIAGNOSIS — E113293 Type 2 diabetes mellitus with mild nonproliferative diabetic retinopathy without macular edema, bilateral: Secondary | ICD-10-CM | POA: Diagnosis not present

## 2018-11-05 DIAGNOSIS — C61 Malignant neoplasm of prostate: Secondary | ICD-10-CM | POA: Diagnosis not present

## 2018-11-17 DIAGNOSIS — E113293 Type 2 diabetes mellitus with mild nonproliferative diabetic retinopathy without macular edema, bilateral: Secondary | ICD-10-CM | POA: Diagnosis not present

## 2018-11-17 DIAGNOSIS — Z13220 Encounter for screening for lipoid disorders: Secondary | ICD-10-CM | POA: Diagnosis not present

## 2018-11-17 DIAGNOSIS — I10 Essential (primary) hypertension: Secondary | ICD-10-CM | POA: Diagnosis not present

## 2018-12-22 ENCOUNTER — Other Ambulatory Visit: Payer: Self-pay

## 2018-12-24 ENCOUNTER — Ambulatory Visit: Payer: Self-pay

## 2018-12-24 ENCOUNTER — Ambulatory Visit: Payer: Self-pay | Admitting: Oncology

## 2019-01-12 ENCOUNTER — Other Ambulatory Visit: Payer: Self-pay

## 2019-01-12 ENCOUNTER — Inpatient Hospital Stay: Payer: 59 | Attending: Oncology

## 2019-01-12 DIAGNOSIS — Z5111 Encounter for antineoplastic chemotherapy: Secondary | ICD-10-CM | POA: Insufficient documentation

## 2019-01-12 DIAGNOSIS — C61 Malignant neoplasm of prostate: Secondary | ICD-10-CM | POA: Insufficient documentation

## 2019-01-12 LAB — CBC WITH DIFFERENTIAL (CANCER CENTER ONLY)
Abs Immature Granulocytes: 0.04 10*3/uL (ref 0.00–0.07)
Basophils Absolute: 0 10*3/uL (ref 0.0–0.1)
Basophils Relative: 0 %
Eosinophils Absolute: 0.1 10*3/uL (ref 0.0–0.5)
Eosinophils Relative: 1 %
HCT: 41.8 % (ref 39.0–52.0)
Hemoglobin: 14.3 g/dL (ref 13.0–17.0)
Immature Granulocytes: 1 %
Lymphocytes Relative: 15 %
Lymphs Abs: 1.1 10*3/uL (ref 0.7–4.0)
MCH: 30.7 pg (ref 26.0–34.0)
MCHC: 34.2 g/dL (ref 30.0–36.0)
MCV: 89.7 fL (ref 80.0–100.0)
Monocytes Absolute: 0.6 10*3/uL (ref 0.1–1.0)
Monocytes Relative: 8 %
Neutro Abs: 5.3 10*3/uL (ref 1.7–7.7)
Neutrophils Relative %: 75 %
Platelet Count: 189 10*3/uL (ref 150–400)
RBC: 4.66 MIL/uL (ref 4.22–5.81)
RDW: 13.3 % (ref 11.5–15.5)
WBC Count: 7.1 10*3/uL (ref 4.0–10.5)
nRBC: 0 % (ref 0.0–0.2)

## 2019-01-12 LAB — CMP (CANCER CENTER ONLY)
ALT: 41 U/L (ref 0–44)
AST: 33 U/L (ref 15–41)
Albumin: 4.2 g/dL (ref 3.5–5.0)
Alkaline Phosphatase: 64 U/L (ref 38–126)
Anion gap: 11 (ref 5–15)
BUN: 23 mg/dL — ABNORMAL HIGH (ref 6–20)
CO2: 23 mmol/L (ref 22–32)
Calcium: 9.7 mg/dL (ref 8.9–10.3)
Chloride: 106 mmol/L (ref 98–111)
Creatinine: 1.06 mg/dL (ref 0.61–1.24)
GFR, Est AFR Am: >60 mL/min (ref >60)
GFR, Estimated: >60 mL/min (ref >60)
Glucose, Bld: 189 mg/dL — ABNORMAL HIGH (ref 70–99)
Potassium: 4.5 mmol/L (ref 3.5–5.1)
Sodium: 140 mmol/L (ref 135–145)
Total Bilirubin: 0.5 mg/dL (ref 0.3–1.2)
Total Protein: 7.7 g/dL (ref 6.5–8.1)

## 2019-01-13 LAB — TESTOSTERONE: Testosterone: 11 ng/dL — ABNORMAL LOW (ref 264–916)

## 2019-01-13 LAB — PROSTATE-SPECIFIC AG, SERUM (LABCORP): Prostate Specific Ag, Serum: 0.8 ng/mL (ref 0.0–4.0)

## 2019-01-14 ENCOUNTER — Inpatient Hospital Stay: Payer: 59

## 2019-01-14 ENCOUNTER — Inpatient Hospital Stay (HOSPITAL_BASED_OUTPATIENT_CLINIC_OR_DEPARTMENT_OTHER): Payer: 59 | Admitting: Oncology

## 2019-01-14 ENCOUNTER — Other Ambulatory Visit: Payer: Self-pay

## 2019-01-14 ENCOUNTER — Telehealth: Payer: Self-pay | Admitting: Oncology

## 2019-01-14 VITALS — BP 136/71 | HR 62 | Temp 98.7°F | Resp 17 | Ht 68.0 in | Wt 239.8 lb

## 2019-01-14 DIAGNOSIS — Z5111 Encounter for antineoplastic chemotherapy: Secondary | ICD-10-CM | POA: Diagnosis not present

## 2019-01-14 DIAGNOSIS — C61 Malignant neoplasm of prostate: Secondary | ICD-10-CM

## 2019-01-14 MED ORDER — LEUPROLIDE ACETATE (4 MONTH) 30 MG IM KIT
30.0000 mg | PACK | Freq: Once | INTRAMUSCULAR | Status: AC
  Administered 2019-01-14: 30 mg via INTRAMUSCULAR
  Filled 2019-01-14: qty 30

## 2019-01-14 NOTE — Telephone Encounter (Signed)
Gave avs and calendar ° °

## 2019-01-14 NOTE — Patient Instructions (Signed)
Leuprolide injection What is this medicine? LEUPROLIDE (loo PROE lide) is a man-made hormone. It is used to treat the symptoms of prostate cancer. This medicine may also be used to treat children with early onset of puberty. It may be used for other hormonal conditions. This medicine may be used for other purposes; ask your health care provider or pharmacist if you have questions. COMMON BRAND NAME(S): Lupron What should I tell my health care provider before I take this medicine? They need to know if you have any of these conditions:  diabetes  heart disease or previous heart attack  high blood pressure  high cholesterol  pain or difficulty passing urine  spinal cord metastasis  stroke  tobacco smoker  an unusual or allergic reaction to leuprolide, benzyl alcohol, other medicines, foods, dyes, or preservatives  pregnant or trying to get pregnant  breast-feeding How should I use this medicine? This medicine is for injection under the skin or into a muscle. You will be taught how to prepare and give this medicine. Use exactly as directed. Take your medicine at regular intervals. Do not take your medicine more often than directed. It is important that you put your used needles and syringes in a special sharps container. Do not put them in a trash can. If you do not have a sharps container, call your pharmacist or healthcare provider to get one. A special MedGuide will be given to you by the pharmacist with each prescription and refill. Be sure to read this information carefully each time. Talk to your pediatrician regarding the use of this medicine in children. While this medicine may be prescribed for children as young as 8 years for selected conditions, precautions do apply. Overdosage: If you think you have taken too much of this medicine contact a poison control center or emergency room at once. NOTE: This medicine is only for you. Do not share this medicine with others. What if  I miss a dose? If you miss a dose, take it as soon as you can. If it is almost time for your next dose, take only that dose. Do not take double or extra doses. What may interact with this medicine? Do not take this medicine with any of the following medications:  chasteberry This medicine may also interact with the following medications:  herbal or dietary supplements, like black cohosh or DHEA  male hormones, like estrogens or progestins and birth control pills, patches, rings, or injections  male hormones, like testosterone This list may not describe all possible interactions. Give your health care provider a list of all the medicines, herbs, non-prescription drugs, or dietary supplements you use. Also tell them if you smoke, drink alcohol, or use illegal drugs. Some items may interact with your medicine. What should I watch for while using this medicine? Visit your doctor or health care professional for regular checks on your progress. During the first week, your symptoms may get worse, but then will improve as you continue your treatment. You may get hot flashes, increased bone pain, increased difficulty passing urine, or an aggravation of nerve symptoms. Discuss these effects with your doctor or health care professional, some of them may improve with continued use of this medicine. Male patients may experience a menstrual cycle or spotting during the first 2 months of therapy with this medicine. If this continues, contact your doctor or health care professional. This medicine may increase blood sugar. Ask your healthcare provider if changes in diet or medicines are needed if   you have diabetes. What side effects may I notice from receiving this medicine? Side effects that you should report to your doctor or health care professional as soon as possible:  allergic reactions like skin rash, itching or hives, swelling of the face, lips, or tongue  breathing problems  chest  pain  depression or memory disorders  pain in your legs or groin  pain at site where injected  severe headache  signs and symptoms of high blood sugar such as being more thirsty or hungry or having to urinate more than normal. You may also feel very tired or have blurry vision  swelling of the feet and legs  visual changes  vomiting Side effects that usually do not require medical attention (report to your doctor or health care professional if they continue or are bothersome):  breast swelling or tenderness  decrease in sex drive or performance  diarrhea  hot flashes  loss of appetite  muscle, joint, or bone pains  nausea  redness or irritation at site where injected  skin problems or acne This list may not describe all possible side effects. Call your doctor for medical advice about side effects. You may report side effects to FDA at 1-800-FDA-1088. Where should I keep my medicine? Keep out of the reach of children. Store below 25 degrees C (77 degrees F). Do not freeze. Protect from light. Do not use if it is not clear or if there are particles present. Throw away any unused medicine after the expiration date. NOTE: This sheet is a summary. It may not cover all possible information. If you have questions about this medicine, talk to your doctor, pharmacist, or health care provider.  2020 Elsevier/Gold Standard (2018-04-15 09:52:48)  

## 2019-01-14 NOTE — Progress Notes (Signed)
Hematology and Oncology Follow Up Visit  NIHAR KLUS [id number] 1960/10/07 58 y.o. 01/14/2019 11:39 AM Koirala, [name], MDKoirala, [name], MD   Principle Diagnosis: 58 year old man with biochemically relapsing prostate cancer initially diagnosed in 2017.  He presented with Gleason score 4+5 = 9 and a PSA 4.59 and subsequently developed relapse in 2019.  Prior Therapy:   He is status post robotic-assisted laparoscopic radical prostatectomy and bilateral extended pelvic lymphadenectomy on 01/28/2016. The right posterior soft tissue margins were positive and 2 out of 12 lymph nodes on the right side involved with cancer. 0 out of 10 lymph nodes in the left side had any cancer involvement.  He is status post radiation therapy between November 2017 in January 2018.  He had 45 Gy to the prostate and pelvic lymph nodes with additional 23.4 Gy prostatic fossa boost.  He also completed 6 months of androgen deprivation.  His PSA remained undetectable until April 2019 which was 1.38   Current therapy: Lupron 30 mg every 4 months started in July 2019.  Interim History: Mr. Ostrand returns today for a repeat evaluation.  Since last visit, he reports no major changes in his health.  He continues to tolerate Lupron without any complaints.  He denies nausea, fatigue or edema.  He does report some hot flashes but no other concerns.  He denies any bone pain or pathological fractures.  Continues to work full-time.  He denied headaches, blurry vision, syncope or seizures.  Denies any fevers, chills or sweats.  Denied chest pain, palpitation, orthopnea or leg edema.  Denied cough, wheezing or hemoptysis.  Denied nausea, vomiting or abdominal pain.  Denies any constipation or diarrhea.  Denies any frequency urgency or hesitancy.  Denies any arthralgias or myalgias.  Denies any skin rashes or lesions.  Denies any bleeding or clotting tendency.  Denies any easy bruising.  Denies any hair or nail changes.  Denies any  anxiety or depression.  Remaining review of system is negative.     .  Medications: Unchanged by my review. Current Outpatient Medications  Medication Sig Dispense Refill  . glimepiride (AMARYL) 2 MG tablet Take 2 mg by mouth daily with breakfast.    . lisinopril (PRINIVIL,ZESTRIL) 40 MG tablet Take 1 tablet by mouth every evening.   3   No current facility-administered medications for this visit.      Allergies:  Allergies  Allergen Reactions  . Morphine And Related Nausea And Vomiting    Past Medical History, Surgical history, Social history, and Family History without any changes on review.  . Physical Exam:   Blood pressure 136/71, pulse 62, temperature 98.7 F (37.1 C), temperature source Oral, resp. rate 17, height 5\' 8"  (1.727 m), weight 239 lb 12.8 oz (108.8 kg), SpO2 96 %.     ECOG: 0     General appearance: Alert, awake without any distress. Head: Atraumatic without abnormalities Oropharynx: Without any thrush or ulcers. Eyes: No scleral icterus. Lymph nodes: No lymphadenopathy noted in the cervical, supraclavicular, or axillary nodes Heart:regular rate and rhythm, without any murmurs or gallops.   Lung: Clear to auscultation without any rhonchi, wheezes or dullness to percussion. Abdomin: Soft, nontender without any shifting dullness or ascites. Musculoskeletal: No clubbing or cyanosis. Neurological: No motor or sensory deficits. Skin: No rashes or lesions. Psychiatric: Mood and affect appeared normal.   .   Lab Results: Lab Results  Component Value Date   WBC 7.1 01/12/2019   HGB 14.3 01/12/2019   HCT 41.8  01/12/2019   MCV 89.7 01/12/2019   PLT 189 01/12/2019     Chemistry      Component Value Date/Time   NA 140 01/12/2019 1106   K 4.5 01/12/2019 1106   CL 106 01/12/2019 1106   CO2 23 01/12/2019 1106   BUN 23 (H) 01/12/2019 1106   CREATININE 1.06 01/12/2019 1106      Component Value Date/Time   CALCIUM 9.7 01/12/2019 1106    ALKPHOS 64 01/12/2019 1106   AST 33 01/12/2019 1106   ALT 41 01/12/2019 1106   BILITOT 0.5 01/12/2019 1106      Results for ESKER, DEVER (MRN [id number]) as of 01/14/2019 08:54  Ref. Range 09/01/2018 07:55 01/12/2019 11:06  Prostate Specific Ag, Serum Latest Ref Range: 0.0 - 4.0 ng/mL 0.3 0.8     Impression and Plan:   58 year old man with:   1.  Biochemically relapsing prostate cancer noted in 2019 without any measurable disease.  He continues to have reasonable response with his PSA is currently at 0.8.  His PSA did rise slowly from 0.2 in November 2019.  Risks and benefits of continuing this therapy versus additional therapy was reviewed.  The role for adding Nicki Reaper on others were reviewed.  After discussion today, we will continue with androgen deprivation therapy alone and monitor his PSA.  He will have repeat imaging studies his PSA start doubling the whole numbers less than 6 months.  2.  Lower urinary tract symptoms: Resolved at this time.  3.  Bone health: I recommended continued calcium and vitamin D supplements for osteoporosis prevention.  4.  Follow-up: Will be in 4 months for repeat evaluation and Lupron injection.  15  minutes was spent with the patient face-to-face today.  More than 50% of time was spent on reviewing his disease status, treatment options, complications like therapy of this.    Zola Button, MD 7/17/202011:39 AM

## 2019-05-13 ENCOUNTER — Other Ambulatory Visit: Payer: Self-pay

## 2019-05-13 ENCOUNTER — Inpatient Hospital Stay: Payer: 59 | Attending: Oncology

## 2019-05-13 DIAGNOSIS — C61 Malignant neoplasm of prostate: Secondary | ICD-10-CM | POA: Diagnosis present

## 2019-05-13 DIAGNOSIS — Z5111 Encounter for antineoplastic chemotherapy: Secondary | ICD-10-CM | POA: Diagnosis not present

## 2019-05-13 LAB — CMP (CANCER CENTER ONLY)
ALT: 40 U/L (ref 0–44)
AST: 26 U/L (ref 15–41)
Albumin: 4.3 g/dL (ref 3.5–5.0)
Alkaline Phosphatase: 61 U/L (ref 38–126)
Anion gap: 12 (ref 5–15)
BUN: 22 mg/dL — ABNORMAL HIGH (ref 6–20)
CO2: 20 mmol/L — ABNORMAL LOW (ref 22–32)
Calcium: 9.5 mg/dL (ref 8.9–10.3)
Chloride: 107 mmol/L (ref 98–111)
Creatinine: 1 mg/dL (ref 0.61–1.24)
GFR, Est AFR Am: >60 mL/min (ref >60)
GFR, Estimated: >60 mL/min (ref >60)
Glucose, Bld: 166 mg/dL — ABNORMAL HIGH (ref 70–99)
Potassium: 4.3 mmol/L (ref 3.5–5.1)
Sodium: 139 mmol/L (ref 135–145)
Total Bilirubin: 0.4 mg/dL (ref 0.3–1.2)
Total Protein: 7.5 g/dL (ref 6.5–8.1)

## 2019-05-13 LAB — CBC WITH DIFFERENTIAL (CANCER CENTER ONLY)
Abs Immature Granulocytes: 0.04 10*3/uL (ref 0.00–0.07)
Basophils Absolute: 0 10*3/uL (ref 0.0–0.1)
Basophils Relative: 0 %
Eosinophils Absolute: 0.1 10*3/uL (ref 0.0–0.5)
Eosinophils Relative: 1 %
HCT: 43.7 % (ref 39.0–52.0)
Hemoglobin: 14.8 g/dL (ref 13.0–17.0)
Immature Granulocytes: 1 %
Lymphocytes Relative: 17 %
Lymphs Abs: 1.2 10*3/uL (ref 0.7–4.0)
MCH: 30.3 pg (ref 26.0–34.0)
MCHC: 33.9 g/dL (ref 30.0–36.0)
MCV: 89.4 fL (ref 80.0–100.0)
Monocytes Absolute: 0.6 10*3/uL (ref 0.1–1.0)
Monocytes Relative: 8 %
Neutro Abs: 5 10*3/uL (ref 1.7–7.7)
Neutrophils Relative %: 73 %
Platelet Count: 166 10*3/uL (ref 150–400)
RBC: 4.89 MIL/uL (ref 4.22–5.81)
RDW: 13 % (ref 11.5–15.5)
WBC Count: 7 10*3/uL (ref 4.0–10.5)
nRBC: 0 % (ref 0.0–0.2)

## 2019-05-14 LAB — PROSTATE-SPECIFIC AG, SERUM (LABCORP): Prostate Specific Ag, Serum: 3.3 ng/mL (ref 0.0–4.0)

## 2019-05-17 ENCOUNTER — Inpatient Hospital Stay: Payer: 59 | Admitting: Oncology

## 2019-05-17 ENCOUNTER — Telehealth: Payer: Self-pay | Admitting: Pharmacist

## 2019-05-17 ENCOUNTER — Other Ambulatory Visit: Payer: Self-pay

## 2019-05-17 ENCOUNTER — Inpatient Hospital Stay: Payer: 59

## 2019-05-17 ENCOUNTER — Encounter: Payer: Self-pay | Admitting: Medical Oncology

## 2019-05-17 VITALS — BP 157/73 | HR 60 | Temp 98.0°F | Resp 17 | Ht 68.0 in | Wt 245.0 lb

## 2019-05-17 DIAGNOSIS — C61 Malignant neoplasm of prostate: Secondary | ICD-10-CM

## 2019-05-17 DIAGNOSIS — Z5111 Encounter for antineoplastic chemotherapy: Secondary | ICD-10-CM | POA: Diagnosis not present

## 2019-05-17 MED ORDER — LEUPROLIDE ACETATE (4 MONTH) 30 MG ~~LOC~~ KIT
30.0000 mg | PACK | Freq: Once | SUBCUTANEOUS | Status: AC
  Administered 2019-05-17: 30 mg via SUBCUTANEOUS
  Filled 2019-05-17: qty 30

## 2019-05-17 MED ORDER — XTANDI 40 MG PO CAPS: 160.0000 mg | ORAL_CAPSULE | Freq: Every day | ORAL | 0 refills | Status: DC

## 2019-05-17 NOTE — Progress Notes (Signed)
Hematology and Oncology Follow Up Visit  Travis Bowman [id number] December 06, 1960 58 y.o. 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  He was found to have Gleason score 4+5 = 9 and a PSA 4.59 in 2015 at the time of diagnosis.   Prior Therapy:   He is status post robotic-assisted laparoscopic radical prostatectomy and bilateral extended pelvic lymphadenectomy on 01/28/2016. The right posterior soft tissue margins were positive and 2 out of 12 lymph nodes on the right side involved with cancer. 0 out of 10 lymph nodes in the left side had any cancer involvement.  He is status post radiation therapy between November 2017 in January 2018.  He had 45 Gy to the prostate and pelvic lymph nodes with additional 23.4 Gy prostatic fossa boost.  He also completed 6 months of androgen deprivation.  His PSA remained undetectable until April 2019 which was 1.38   Current therapy: Lupron 30 mg every 4 months started in July 2019.  Interim History: [name] is here for return evaluation.  Since the last visit, he reports no major changes in his health.  He continues to work full-time without any difficulty in doing so.  He denies any bone pain pathological fractures or hospitalizations.  His performance status and quality of life remain excellent.  He denies any frequency or urgency.  He denies any decline in his energy.  Patient denied any alteration mental status, neuropathy, confusion or dizziness.  Denies any headaches or lethargy.  Denies any night sweats, weight loss or changes in appetite.  Denied orthopnea, dyspnea on exertion or chest discomfort.  Denies shortness of breath, difficulty breathing hemoptysis or cough.  Denies any abdominal distention, nausea, early satiety or dyspepsia.  Denies any hematuria, frequency, dysuria or nocturia.  Denies any skin irritation, dryness or  rash.  Denies any ecchymosis or petechiae.  Denies any lymphadenopathy or clotting.  Denies any heat or cold intolerance.  Denies any anxiety or depression.  Remaining review of system is negative.      .  Medications: Updated without any changes. Current Outpatient Medications  Medication Sig Dispense Refill  . glimepiride (AMARYL) 2 MG tablet Take 2 mg by mouth daily with breakfast.    . lisinopril (PRINIVIL,ZESTRIL) 40 MG tablet Take 1 tablet by mouth every evening.   3   No current facility-administered medications for this visit.      Allergies:  Allergies  Allergen Reactions  . Morphine And Related Nausea And Vomiting    Past Medical History, Surgical history, Social history, and Family History unchanged on review. . Physical Exam:    Blood pressure (!) 157/73, pulse 60, temperature 98 F (36.7 C), temperature source Temporal, resp. rate 17, height 5\' 8"  (1.727 m), weight 245 lb (111.1 kg), SpO2 99 %.     ECOG: 0    General appearance: Comfortable appearing without any discomfort Head: Normocephalic without any trauma Oropharynx: Mucous membranes are moist and pink without any thrush or ulcers. Eyes: Pupils are equal and round reactive to light. Lymph nodes: No cervical, supraclavicular, inguinal or axillary lymphadenopathy.   Heart:regular rate and rhythm.  S1 and S2 without leg edema. Lung: Clear without any rhonchi or wheezes.  No dullness to percussion. Abdomin: Soft, nontender, nondistended with good bowel sounds.  No hepatosplenomegaly. Musculoskeletal: No joint deformity or effusion.  Full range of motion noted. Neurological: No deficits noted on motor,  sensory and deep tendon reflex exam. Skin: No petechial rash or dryness.  Appeared moist.     .   Lab Results: Lab Results  Component Value Date   WBC 7.0 05/13/2019   HGB 14.8 05/13/2019   HCT 43.7 05/13/2019   MCV 89.4 05/13/2019   PLT 166 05/13/2019     Chemistry      Component Value  Date/Time   NA 139 05/13/2019 0757   K 4.3 05/13/2019 0757   CL 107 05/13/2019 0757   CO2 20 (L) 05/13/2019 0757   BUN 22 (H) 05/13/2019 0757   CREATININE 1.00 05/13/2019 0757      Component Value Date/Time   CALCIUM 9.5 05/13/2019 0757   ALKPHOS 61 05/13/2019 0757   AST 26 05/13/2019 0757   ALT 40 05/13/2019 0757   BILITOT 0.4 05/13/2019 0757     Results for Travis Bowman (MRN [id number]) as of 05/17/2019 08:22  Ref. Range 01/12/2019 11:06 05/13/2019 07:57  Prostate Specific Ag, Serum Latest Ref Range: 0.0 - 4.0 ng/mL 0.8 3.3       Impression and Plan:   58 year old man with:   1.  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   He is currently on androgen deprivation therapy alone with a PSA continues to rise.  On May 13, 2019 his PSA was 3.3 which is increased from 0.8.  The natural course of this disease and treatment options were reiterated.  I recommended updating his staging work-up including CT scan and bone scan and consider adding additional therapy such as Zytiga or Xtandi.  For the time being we will continue with androgen deprivation therapy as well.    After discussion today he is agreeable to proceed with Xtandi.  We will proceed with 160 mg daily.  Complication occluding hypertension, fatigue and rarely seizures were reiterated.  We will start of this after obtaining CT scan and a bone scan in the near future.  2.  Lower urinary tract symptoms: None reported at this time.  3.  Bone health: He will continue calcium and vitamin D supplements.  4.  Follow-up: He will return in 2 months for repeat follow-up  25  minutes was spent with the patient face-to-face today.  More than 50% of time was dedicated to reviewing laboratory data, disease status update and managing future plan of care.      Zola Button, MD 11/17/20208:21 AM

## 2019-05-17 NOTE — Patient Instructions (Signed)

## 2019-05-17 NOTE — Telephone Encounter (Signed)
Oral Oncology Pharmacist Encounter  Received new prescription for Xtandi (enzalutamide) for the treatment of castrate resistant prostate cancer in conjunction with leuprolide acetate, planned duration until disease progression or unacceptable toxicity.  Patient originally diagnosed with prostate cancer in 2015. Patient has had a laparoscopic radical prostatectomy in 2017 and was treated with radiation therapy in November 2017-January 2018. Patient started on leuprolide 30 mg q37mo in July 2019 when PSA increased from previously undetectable to 1.38. Patient's most recent PSA on 05/13/19 now 3.3, and up from 0.8 on 01/12/19.  Plan for patient to be initiated on Xtandi 40 mg capsules, take 4 capsules (160 mg) by mouth once daily with or without food.   CMP and CBC from 05/13/19 assessed, OK for treatment initiation.  Current medication list in Epic reviewed, DDIs with Xtandi identified:  Category C DDI between glimepiride and Xtandi. Xtandi, which is a CYP2C9 inducer, can decrease serum concentrations of sulfonylureas resulting in decreased efficacy of glimepiride. Patient's most recent blood glucose slightly elevated at 166. Will monitor trend  throughout therapy.   Prescription has been e-scribed to the [facility] for benefits analysis and approval.  Oral Oncology Clinic will continue to follow for insurance authorization, copayment issues, initial counseling and start date.  Leron Croak, PharmD PGY2 Hematology/Oncology Pharmacy Resident 05/17/2019 3:18 PM Oral Oncology Clinic [phone]

## 2019-05-17 NOTE — Progress Notes (Signed)
Cree states he is doing well but PSA continues to rise.He is going to have staging scans and then start [place].

## 2019-05-18 ENCOUNTER — Telehealth: Payer: Self-pay | Admitting: Pharmacist

## 2019-05-18 ENCOUNTER — Telehealth: Payer: Self-pay | Admitting: Oncology

## 2019-05-18 NOTE — Telephone Encounter (Signed)
Scheduled appt per 11/17 los. ° °Printed and mailed appt calendar. °

## 2019-05-18 NOTE — Telephone Encounter (Signed)
Oral Chemotherapy Pharmacist Encounter   I spoke with patient for overview of: Xtandi (enzalutamide) for the treatment of castrate resistant prostate cancer in conjunction with leuprolide acetate, planned duration until disease progression or unacceptable toxicity.  Patient is planned to start [facility] after staging scans that have not yet been scheduled.   Counseled patient on administration, dosing, side effects, monitoring, drug-food interactions, safe handling, storage, and disposal.  Patient will take Xtandi 40mg  capsules, 4 capsules (160mg ) by mouth once daily without regard to food.  Xtandi start date: TBD, pending scans  Adverse effects include but are not limited to: peripheral edema, GI upset, hypertension, hot flashes, fatigue, falls/fractures, and arthralgias.   Patient instructed about small risk of seizures with Xtandi treatment.  Reviewed with patient importance of keeping a medication schedule and plan for any missed doses.  Medication reconciliation performed and medication/allergy list updated.  Insurance authorization for Gillermina Phy has been obtained. Test claim at the pharmacy revealed copayment $50 for 1st fill of Xtandi. Copayment will be covered with manufacturer copayment coupon.  This will ship from the [facility] on 05/19/19 to deliver to patient's home on 11/20.  Patient informed the pharmacy will reach out 5-7 days prior to needing next fill of Xtandi to coordinate continued medication acquisition to prevent break in therapy.  All questions answered.  Mr. Kozub voiced understanding and appreciation.   Patient knows to call the office with questions or concerns.   Johny Drilling, PharmD, BCPS, BCOP  05/18/2019   12:26 PM Oral Oncology Clinic [phone]

## 2019-05-18 NOTE — Telephone Encounter (Signed)
Oral Oncology Pharmacist Encounter  Insurance authorization for [place] (enzalutamide) capsules submitted to [employer] on Cover My Meds  Key: [facility] Status: pending  This encounter will continue to be updated until final determination.  Johny Drilling, PharmD, BCPS, BCOP  05/18/2019   9:02 AM Oral Oncology Clinic [phone]

## 2019-05-18 NOTE — Telephone Encounter (Signed)
Oral Oncology Pharmacist Encounter  I signed patient up for Xtandi (enzalutamide) copayment coupon to cover out of pocket expenses for [place] at the pharmacy.  ID: [id number] BIN: [id number] PCN: [id number] Group: [id number]  Pharmacy help desk: [phone]  This will be shared with the [facility].  Johny Drilling, PharmD, BCPS, BCOP  05/18/2019 12:37 PM Oral Oncology Clinic [phone]

## 2019-05-18 NOTE — Telephone Encounter (Signed)
Oral Oncology Pharmacist Encounter  Insurance authorization for [place] (enzalutamide) capsules submitted to [employer] on Cover My Meds  Key: [facility] Status: approved Reference #: [id number] Effective dates: 05/18/2019 - 05/17/2020  Johny Drilling, PharmD, BCPS, BCOP  05/18/2019   10:54 AM Oral Oncology Clinic [phone]

## 2019-05-19 MED FILL — XTANDI 40 MG CAPSULE: 40 | 30 days supply | Qty: 120 | Fill #0

## 2019-05-20 NOTE — Telephone Encounter (Signed)
Oral Oncology Pharmacist Encounter  Confirmed with the [facility] that Travis Bowman was shipped on 05/19/2019 for copayment $0 with the use of a copay card.  Johny Drilling, PharmD, BCPS, BCOP  05/20/2019   11:53 AM Oral Oncology Clinic [phone]

## 2019-07-13 ENCOUNTER — Encounter (HOSPITAL_COMMUNITY): Payer: 59

## 2019-07-13 ENCOUNTER — Ambulatory Visit (HOSPITAL_COMMUNITY): Admission: RE | Admit: 2019-07-13 | Payer: 59 | Source: Ambulatory Visit

## 2019-07-15 ENCOUNTER — Telehealth: Payer: Self-pay | Admitting: Oncology

## 2019-07-15 ENCOUNTER — Other Ambulatory Visit: Payer: Self-pay | Admitting: Oncology

## 2019-07-15 DIAGNOSIS — C61 Malignant neoplasm of prostate: Secondary | ICD-10-CM

## 2019-07-15 NOTE — Telephone Encounter (Signed)
Returned patient's phone call regarding cancelling 01/19 appointment, per patient's request appointment has been cancelled.

## 2019-07-19 ENCOUNTER — Other Ambulatory Visit: Payer: 59

## 2019-07-19 ENCOUNTER — Ambulatory Visit: Payer: 59 | Admitting: Oncology

## 2019-07-27 ENCOUNTER — Telehealth: Payer: Self-pay | Admitting: Oncology

## 2019-07-27 NOTE — Telephone Encounter (Signed)
Scheduled appt per 1/22 sch message - pt Is aware of appt date and time

## 2019-07-29 ENCOUNTER — Encounter (HOSPITAL_COMMUNITY)
Admission: RE | Admit: 2019-07-29 | Discharge: 2019-07-29 | Disposition: A | Discharge status: Home / Self Care | Payer: 59 | Source: Ambulatory Visit | Attending: Oncology | Admitting: Oncology

## 2019-07-29 ENCOUNTER — Inpatient Hospital Stay: Payer: 59 | Attending: Oncology

## 2019-07-29 ENCOUNTER — Other Ambulatory Visit: Payer: Self-pay

## 2019-07-29 ENCOUNTER — Ambulatory Visit (HOSPITAL_COMMUNITY)
Admission: RE | Admit: 2019-07-29 | Discharge: 2019-07-29 | Disposition: A | Discharge status: Home / Self Care | Payer: 59 | Source: Ambulatory Visit | Attending: Oncology | Admitting: Oncology

## 2019-07-29 ENCOUNTER — Encounter (HOSPITAL_COMMUNITY): Payer: Self-pay

## 2019-07-29 DIAGNOSIS — C61 Malignant neoplasm of prostate: Secondary | ICD-10-CM | POA: Insufficient documentation

## 2019-07-29 LAB — CBC WITH DIFFERENTIAL (CANCER CENTER ONLY)
Abs Immature Granulocytes: 0.04 10*3/uL (ref 0.00–0.07)
Basophils Absolute: 0 10*3/uL (ref 0.0–0.1)
Basophils Relative: 0 %
Eosinophils Absolute: 0.1 10*3/uL (ref 0.0–0.5)
Eosinophils Relative: 1 %
HCT: 44.6 % (ref 39.0–52.0)
Hemoglobin: 15.2 g/dL (ref 13.0–17.0)
Immature Granulocytes: 0 %
Lymphocytes Relative: 13 %
Lymphs Abs: 1.3 10*3/uL (ref 0.7–4.0)
MCH: 30.6 pg (ref 26.0–34.0)
MCHC: 34.1 g/dL (ref 30.0–36.0)
MCV: 89.7 fL (ref 80.0–100.0)
Monocytes Absolute: 0.7 10*3/uL (ref 0.1–1.0)
Monocytes Relative: 7 %
Neutro Abs: 7.5 10*3/uL (ref 1.7–7.7)
Neutrophils Relative %: 79 %
Platelet Count: 201 10*3/uL (ref 150–400)
RBC: 4.97 MIL/uL (ref 4.22–5.81)
RDW: 13.2 % (ref 11.5–15.5)
WBC Count: 9.5 10*3/uL (ref 4.0–10.5)
nRBC: 0 % (ref 0.0–0.2)

## 2019-07-29 LAB — CMP (CANCER CENTER ONLY)
ALT: 39 U/L (ref 0–44)
AST: 24 U/L (ref 15–41)
Albumin: 4.5 g/dL (ref 3.5–5.0)
Alkaline Phosphatase: 72 U/L (ref 38–126)
Anion gap: 10 (ref 5–15)
BUN: 23 mg/dL — ABNORMAL HIGH (ref 6–20)
CO2: 24 mmol/L (ref 22–32)
Calcium: 9.5 mg/dL (ref 8.9–10.3)
Chloride: 104 mmol/L (ref 98–111)
Creatinine: 0.94 mg/dL (ref 0.61–1.24)
GFR, Est AFR Am: >60 mL/min (ref >60)
GFR, Estimated: >60 mL/min (ref >60)
Glucose, Bld: 151 mg/dL — ABNORMAL HIGH (ref 70–99)
Potassium: 4.5 mmol/L (ref 3.5–5.1)
Sodium: 138 mmol/L (ref 135–145)
Total Bilirubin: 0.4 mg/dL (ref 0.3–1.2)
Total Protein: 7.5 g/dL (ref 6.5–8.1)

## 2019-07-29 MED ORDER — SODIUM CHLORIDE (PF) 0.9 % IJ SOLN
INTRAMUSCULAR | Status: AC
  Filled 2019-07-29: qty 50

## 2019-07-29 MED ORDER — IOHEXOL 300 MG/ML  SOLN
100.0000 mL | Freq: Once | INTRAMUSCULAR | Status: AC | PRN
  Administered 2019-07-29: 100 mL via INTRAVENOUS

## 2019-07-29 MED ORDER — TECHNETIUM TC 99M MEDRONATE IV KIT
19.3000 | PACK | Freq: Once | INTRAVENOUS | Status: AC | PRN
  Administered 2019-07-29: 19.3 via INTRAVENOUS

## 2019-07-30 LAB — PROSTATE-SPECIFIC AG, SERUM (LABCORP): Prostate Specific Ag, Serum: 12.3 ng/mL — ABNORMAL HIGH (ref 0.0–4.0)

## 2019-08-08 ENCOUNTER — Telehealth: Payer: Self-pay | Admitting: *Deleted

## 2019-08-08 NOTE — Telephone Encounter (Signed)
Mr Filkins is requesting the results of his scans done 07/29/19

## 2019-08-11 ENCOUNTER — Telehealth: Payer: Self-pay | Admitting: Oncology

## 2019-08-11 NOTE — Telephone Encounter (Signed)
Scheduled appt per 2/9 sch message - pt is aware of appt date and times

## 2019-09-05 ENCOUNTER — Other Ambulatory Visit: Payer: Self-pay | Admitting: Oncology

## 2019-09-07 ENCOUNTER — Inpatient Hospital Stay: Payer: 59 | Attending: Oncology

## 2019-09-07 ENCOUNTER — Other Ambulatory Visit: Payer: Self-pay

## 2019-09-07 DIAGNOSIS — C7951 Secondary malignant neoplasm of bone: Secondary | ICD-10-CM | POA: Diagnosis not present

## 2019-09-07 DIAGNOSIS — Z5111 Encounter for antineoplastic chemotherapy: Secondary | ICD-10-CM | POA: Insufficient documentation

## 2019-09-07 DIAGNOSIS — C61 Malignant neoplasm of prostate: Secondary | ICD-10-CM | POA: Insufficient documentation

## 2019-09-07 LAB — CMP (CANCER CENTER ONLY)
ALT: 26 U/L (ref 0–44)
AST: 23 U/L (ref 15–41)
Albumin: 4.1 g/dL (ref 3.5–5.0)
Alkaline Phosphatase: 75 U/L (ref 38–126)
Anion gap: 8 (ref 5–15)
BUN: 20 mg/dL (ref 6–20)
CO2: 24 mmol/L (ref 22–32)
Calcium: 9.5 mg/dL (ref 8.9–10.3)
Chloride: 107 mmol/L (ref 98–111)
Creatinine: 0.87 mg/dL (ref 0.61–1.24)
GFR, Est AFR Am: >60 mL/min (ref >60)
GFR, Estimated: >60 mL/min (ref >60)
Glucose, Bld: 139 mg/dL — ABNORMAL HIGH (ref 70–99)
Potassium: 4.3 mmol/L (ref 3.5–5.1)
Sodium: 139 mmol/L (ref 135–145)
Total Bilirubin: 0.5 mg/dL (ref 0.3–1.2)
Total Protein: 7.3 g/dL (ref 6.5–8.1)

## 2019-09-07 LAB — CBC WITH DIFFERENTIAL (CANCER CENTER ONLY)
Abs Immature Granulocytes: 0.04 10*3/uL (ref 0.00–0.07)
Basophils Absolute: 0 10*3/uL (ref 0.0–0.1)
Basophils Relative: 1 %
Eosinophils Absolute: 0.1 10*3/uL (ref 0.0–0.5)
Eosinophils Relative: 1 %
HCT: 41.5 % (ref 39.0–52.0)
Hemoglobin: 14.3 g/dL (ref 13.0–17.0)
Immature Granulocytes: 1 %
Lymphocytes Relative: 16 %
Lymphs Abs: 1.3 10*3/uL (ref 0.7–4.0)
MCH: 30.5 pg (ref 26.0–34.0)
MCHC: 34.5 g/dL (ref 30.0–36.0)
MCV: 88.5 fL (ref 80.0–100.0)
Monocytes Absolute: 0.6 10*3/uL (ref 0.1–1.0)
Monocytes Relative: 8 %
Neutro Abs: 6.1 10*3/uL (ref 1.7–7.7)
Neutrophils Relative %: 73 %
Platelet Count: 186 10*3/uL (ref 150–400)
RBC: 4.69 MIL/uL (ref 4.22–5.81)
RDW: 13.3 % (ref 11.5–15.5)
WBC Count: 8.2 10*3/uL (ref 4.0–10.5)
nRBC: 0 % (ref 0.0–0.2)

## 2019-09-07 MED FILL — XTANDI 40 MG CAPSULE: 40 | 30 days supply | Qty: 120 | Fill #0

## 2019-09-08 LAB — PROSTATE-SPECIFIC AG, SERUM (LABCORP): Prostate Specific Ag, Serum: 1.2 ng/mL (ref 0.0–4.0)

## 2019-09-09 ENCOUNTER — Other Ambulatory Visit: Payer: Self-pay

## 2019-09-09 ENCOUNTER — Inpatient Hospital Stay: Payer: 59

## 2019-09-09 ENCOUNTER — Inpatient Hospital Stay (HOSPITAL_BASED_OUTPATIENT_CLINIC_OR_DEPARTMENT_OTHER): Payer: 59 | Admitting: Oncology

## 2019-09-09 VITALS — BP 141/78 | HR 71 | Temp 98.7°F | Resp 18 | Ht 68.0 in | Wt 241.6 lb

## 2019-09-09 DIAGNOSIS — C61 Malignant neoplasm of prostate: Secondary | ICD-10-CM | POA: Diagnosis not present

## 2019-09-09 DIAGNOSIS — Z5111 Encounter for antineoplastic chemotherapy: Secondary | ICD-10-CM | POA: Diagnosis not present

## 2019-09-09 MED ORDER — LEUPROLIDE ACETATE (4 MONTH) 30 MG ~~LOC~~ KIT
30.0000 mg | PACK | Freq: Once | SUBCUTANEOUS | Status: AC
  Administered 2019-09-09: 30 mg via SUBCUTANEOUS
  Filled 2019-09-09: qty 30

## 2019-09-09 NOTE — Progress Notes (Signed)
Hematology and Oncology Follow Up Visit  Travis Bowman [id number] August 24, 1960 59 y.o. 09/09/2019 3:26 PM Travis Bowman, MDKoirala, [name], MD   Principle Diagnosis: 59 year old man with advanced prostate cancer with bone disease documented in January 2021.  He has castration-resistant disease after presenting in 2015 with Gleason score 4+5 = 9 and a PSA 4.59.   Prior Therapy:   He is status post robotic-assisted laparoscopic radical prostatectomy and bilateral extended pelvic lymphadenectomy on 01/28/2016. The right posterior soft tissue margins were positive and 2 out of 12 lymph nodes on the right side involved with cancer. 0 out of 10 lymph nodes in the left side had any cancer involvement.  He is status post radiation therapy between November 2017 in January 2018.  He had 45 Gy to the prostate and pelvic lymph nodes with additional 23.4 Gy prostatic fossa boost.  He also completed 6 months of androgen deprivation.  His PSA remained undetectable until April 2019 which was 1.38   Current therapy:   Lupron 30 mg every 4 months started in July 2019.  He received Eligard 30 mg in November 2020.  Xtandi 160 mg daily started in January 2021.  Interim History: [name] returns today for a follow-up visit.  Since last visit, he reports no major changes in his health.  He started taking Xtandi and has tolerated it very well.  He denies any recent complications related to it.  He denies any nausea, fatigue or bone pain.  Denies any excessive fatigue or tiredness.  Performance status and quality of life remained excellent.      .  Medications: Reviewed without changes. Current Outpatient Medications  Medication Sig Dispense Refill  . glimepiride (AMARYL) 2 MG tablet Take 2 mg by mouth daily with breakfast.    . lisinopril (PRINIVIL,ZESTRIL) 40 MG tablet Take 1 tablet by mouth every evening.   3  . XTANDI 40 MG capsule TAKE 4 CAPSULES (160 MG TOTAL) BY MOUTH DAILY. 120 capsule 0   No  current facility-administered medications for this visit.     Allergies:  Allergies  Allergen Reactions  . Morphine And Related Nausea And Vomiting      Physical Exam:   Blood pressure (!) 141/78, pulse 71, temperature 98.7 F (37.1 C), temperature source Temporal, resp. rate 18, height 5\' 8"  (1.727 m), weight 241 lb 9.6 oz (109.6 kg), SpO2 98 %.       ECOG: 0   General appearance: Alert, awake without any distress. Head: Atraumatic without abnormalities Oropharynx: Without any thrush or ulcers. Eyes: No scleral icterus. Lymph nodes: No lymphadenopathy noted in the cervical, supraclavicular, or axillary nodes Heart:regular rate and rhythm, without any murmurs or gallops.   Lung: Clear to auscultation without any rhonchi, wheezes or dullness to percussion. Abdomin: Soft, nontender without any shifting dullness or ascites. Musculoskeletal: No clubbing or cyanosis. Neurological: No motor or sensory deficits. Skin: No rashes or lesions.     .   Lab Results: Lab Results  Component Value Date   WBC 8.2 09/07/2019   HGB 14.3 09/07/2019   HCT 41.5 09/07/2019   MCV 88.5 09/07/2019   PLT 186 09/07/2019     Chemistry      Component Value Date/Time   NA 139 09/07/2019 1030   K 4.3 09/07/2019 1030   CL 107 09/07/2019 1030   CO2 24 09/07/2019 1030   BUN 20 09/07/2019 1030   CREATININE 0.87 09/07/2019 1030      Component Value Date/Time  CALCIUM 9.5 09/07/2019 1030   ALKPHOS 75 09/07/2019 1030   AST 23 09/07/2019 1030   ALT 26 09/07/2019 1030   BILITOT 0.5 09/07/2019 1030       Results for Travis Bowman, Travis Bowman (MRN [id number]) as of 09/09/2019 14:41  Ref. Range 05/13/2019 07:57 07/29/2019 11:16 09/07/2019 10:30  Prostate Specific Ag, Serum Latest Ref Range: 0.0 - 4.0 ng/mL 3.3 12.3 (H) 1.2    IMPRESSION: 1. New patchy focal areas of increased radiotracer activity over the bilateral ribs likely metastatic disease. Faint new focus of increased radiotracer  activity over the proximal left femoral diaphysis likely metastatic disease.  2. Mild symmetric uptake over the appendicular skeleton as described compatible with degenerative changes.   Impression and Plan:   59 year old man with:   1.  Advanced prostate cancer with disease to the bone documented in January 2021 indicating castration-resistant disease.    He is currently on Xtandi with excellent PSA response with decline from 12.3 up to 1.2.  Risks and benefits of continuing this medication long-term was discussed.  Complications that include hypertension, hematuria excessive fatigue were reviewed.  Alternative options such as systemic chemotherapy was also discussed at this time.  Imaging studies obtained in January 2021 was also reviewed and showed low volume disease at this time.  2.  Androgen deprivation: Long-term complication associated with this treatment were reviewed.  These include weight gain and hot flashes.  He will receive Eligard today and repeated in 4 months.  3.  Bone health: I recommended continuing calcium and vitamin D supplements.  Additional treatment with Delton See will be considered if he has worsening metastatic disease.  4.  Hypertension: He is currently on amlodipine with blood pressure under control.  We will continue to monitor on Xtandi.  5.  Follow-up: In 2 months for repeat follow-up.  30  minutes were dedicated to this visit. The time was spent on reviewing laboratory data, imaging studies, discussing treatment options, and answering questions regarding future plan.     Zola Button, MD 3/12/20213:26 PM

## 2019-09-09 NOTE — Patient Instructions (Signed)

## 2019-09-12 ENCOUNTER — Telehealth: Payer: Self-pay | Admitting: Oncology

## 2019-09-12 NOTE — Telephone Encounter (Signed)
Scheduled appt per 3/12 los.  Spoke with pt and he is aware of the appt date and time.

## 2019-10-03 ENCOUNTER — Other Ambulatory Visit: Payer: Self-pay | Admitting: Oncology

## 2019-10-04 ENCOUNTER — Telehealth: Payer: Self-pay | Admitting: Pharmacist

## 2019-10-04 DIAGNOSIS — C61 Malignant neoplasm of prostate: Secondary | ICD-10-CM

## 2019-10-04 MED ORDER — XTANDI 40 MG PO CAPS: 160.0000 mg | ORAL_CAPSULE | Freq: Every day | ORAL | 0 refills | Status: DC

## 2019-10-04 NOTE — Telephone Encounter (Signed)
Oral Chemotherapy Pharmacist Encounter  Due to insurance restriction the medication can no longer be filled at [facility]. Prescription refill has been redirected to [facility].  Supportive information and copay card info was faxed to [facility]. We will continue to follow medication access.   Patient notified about the change in pharmacy and provided with the number to [facility].   Darl Pikes, PharmD, BCPS, [facility] Hematology/Oncology Clinical Pharmacist [facility] [phone]  10/04/2019 9:31 AM

## 2019-10-27 ENCOUNTER — Other Ambulatory Visit: Payer: Self-pay | Admitting: Oncology

## 2019-10-27 DIAGNOSIS — C61 Malignant neoplasm of prostate: Secondary | ICD-10-CM

## 2019-11-11 ENCOUNTER — Inpatient Hospital Stay: Payer: 59 | Attending: Oncology | Admitting: Oncology

## 2019-11-11 ENCOUNTER — Other Ambulatory Visit: Payer: Self-pay

## 2019-11-11 ENCOUNTER — Inpatient Hospital Stay: Payer: 59

## 2019-11-11 VITALS — BP 141/87 | HR 75 | Temp 98.6°F | Resp 17 | Ht 68.0 in | Wt 249.2 lb

## 2019-11-11 DIAGNOSIS — C7951 Secondary malignant neoplasm of bone: Secondary | ICD-10-CM | POA: Diagnosis not present

## 2019-11-11 DIAGNOSIS — E291 Testicular hypofunction: Secondary | ICD-10-CM | POA: Diagnosis not present

## 2019-11-11 DIAGNOSIS — C61 Malignant neoplasm of prostate: Secondary | ICD-10-CM | POA: Diagnosis present

## 2019-11-11 DIAGNOSIS — I10 Essential (primary) hypertension: Secondary | ICD-10-CM | POA: Diagnosis not present

## 2019-11-11 DIAGNOSIS — Z923 Personal history of irradiation: Secondary | ICD-10-CM | POA: Insufficient documentation

## 2019-11-11 LAB — CMP (CANCER CENTER ONLY)
ALT: 24 U/L (ref 0–44)
AST: 21 U/L (ref 15–41)
Albumin: 4.1 g/dL (ref 3.5–5.0)
Alkaline Phosphatase: 68 U/L (ref 38–126)
Anion gap: 13 (ref 5–15)
BUN: 16 mg/dL (ref 6–20)
CO2: 25 mmol/L (ref 22–32)
Calcium: 9.7 mg/dL (ref 8.9–10.3)
Chloride: 103 mmol/L (ref 98–111)
Creatinine: 1 mg/dL (ref 0.61–1.24)
GFR, Est AFR Am: >60 mL/min (ref >60)
GFR, Estimated: >60 mL/min (ref >60)
Glucose, Bld: 178 mg/dL — ABNORMAL HIGH (ref 70–99)
Potassium: 4 mmol/L (ref 3.5–5.1)
Sodium: 141 mmol/L (ref 135–145)
Total Bilirubin: 0.4 mg/dL (ref 0.3–1.2)
Total Protein: 7.5 g/dL (ref 6.5–8.1)

## 2019-11-11 LAB — CBC WITH DIFFERENTIAL (CANCER CENTER ONLY)
Abs Immature Granulocytes: 0.08 10*3/uL — ABNORMAL HIGH (ref 0.00–0.07)
Basophils Absolute: 0 10*3/uL (ref 0.0–0.1)
Basophils Relative: 1 %
Eosinophils Absolute: 0.1 10*3/uL (ref 0.0–0.5)
Eosinophils Relative: 1 %
HCT: 41.4 % (ref 39.0–52.0)
Hemoglobin: 14.6 g/dL (ref 13.0–17.0)
Immature Granulocytes: 1 %
Lymphocytes Relative: 18 %
Lymphs Abs: 1.5 10*3/uL (ref 0.7–4.0)
MCH: 31.8 pg (ref 26.0–34.0)
MCHC: 35.3 g/dL (ref 30.0–36.0)
MCV: 90.2 fL (ref 80.0–100.0)
Monocytes Absolute: 0.5 10*3/uL (ref 0.1–1.0)
Monocytes Relative: 6 %
Neutro Abs: 6.3 10*3/uL (ref 1.7–7.7)
Neutrophils Relative %: 73 %
Platelet Count: 211 10*3/uL (ref 150–400)
RBC: 4.59 MIL/uL (ref 4.22–5.81)
RDW: 13.2 % (ref 11.5–15.5)
WBC Count: 8.6 10*3/uL (ref 4.0–10.5)
nRBC: 0 % (ref 0.0–0.2)

## 2019-11-11 NOTE — Progress Notes (Signed)
Hematology and Oncology Follow Up Visit  Travis Bowman [id number] 1961-06-12 59 y.o. 11/11/2019 3:10 PM Koirala, [name], MDKoirala, [name], MD   Principle Diagnosis: 59 year old man with castration-resistant prostate cancer with disease to the bone diagnosed in January 2021.  He was found to have localized disease after presenting with Gleason score 4+5 = 9 and a PSA 4.59 in 2017.  Prior Therapy:   He is status post robotic-assisted laparoscopic radical prostatectomy and bilateral extended pelvic lymphadenectomy on 01/28/2016. The right posterior soft tissue margins were positive and 2 out of 12 lymph nodes on the right side involved with cancer. 0 out of 10 lymph nodes in the left side had any cancer involvement.  He is status post radiation therapy between November 2017 in January 2018.  He had 45 Gy to the prostate and pelvic lymph nodes with additional 23.4 Gy prostatic fossa boost.  He also completed 6 months of androgen deprivation.  His PSA remained undetectable until April 2019 which was 1.38   Current therapy:   Lupron 30 mg every 4 months started in July 2019.  He is currently on Eligard and next injection scheduled for July 2021.   Xtandi 160 mg daily started in January 2021.  Interim History: Travis Bowman is here for return evaluation.  Since the last visit, he reports no major changes in his health.  He has tolerated Xtandi without any new complaints.  He denies any nausea vomiting or abdominal pain.  He did report some indigestion and mild nausea when he takes [place] without food.  The symptoms improved when taking the medication with food.  Denies any excessive fatigue tiredness.  Denies any back pain or shoulder pain.  Denies any pathological fractures.  Remains active and attends to activities of daily living.      .  Medications: Updated on review.  Current Outpatient Medications  Medication Sig Dispense Refill  . glimepiride (AMARYL) 2 MG tablet Take 2 mg by mouth daily  with breakfast.    . lisinopril (PRINIVIL,ZESTRIL) 40 MG tablet Take 1 tablet by mouth every evening.   3  . XTANDI 40 MG capsule TAKE 4 CAPSULES BY MOUTH  DAILY 120 capsule 0   No current facility-administered medications for this visit.     Allergies:  Allergies  Allergen Reactions  . Morphine And Related Nausea And Vomiting      Physical Exam:     Blood pressure (!) 141/87, pulse 75, temperature 98.6 F (37 C), temperature source Temporal, resp. rate 17, height 5\' 8"  (1.727 m), weight 249 lb 3.2 oz (113 kg), SpO2 97 %.      ECOG: 0   General appearance: Comfortable appearing without any discomfort Head: Normocephalic without any trauma Oropharynx: Mucous membranes are moist and pink without any thrush or ulcers. Eyes: Pupils are equal and round reactive to light. Lymph nodes: No cervical, supraclavicular, inguinal or axillary lymphadenopathy.   Heart:regular rate and rhythm.  S1 and S2 without leg edema. Lung: Clear without any rhonchi or wheezes.  No dullness to percussion. Abdomin: Soft, nontender, nondistended with good bowel sounds.  No hepatosplenomegaly. Musculoskeletal: No joint deformity or effusion.  Full range of motion noted. Neurological: No deficits noted on motor, sensory and deep tendon reflex exam. Skin: No petechial rash or dryness.  Appeared moist.       .   Lab Results: Lab Results  Component Value Date   WBC 8.2 09/07/2019   HGB 14.3 09/07/2019   HCT 41.5 09/07/2019  MCV 88.5 09/07/2019   PLT 186 09/07/2019     Chemistry      Component Value Date/Time   NA 139 09/07/2019 1030   K 4.3 09/07/2019 1030   CL 107 09/07/2019 1030   CO2 24 09/07/2019 1030   BUN 20 09/07/2019 1030   CREATININE 0.87 09/07/2019 1030      Component Value Date/Time   CALCIUM 9.5 09/07/2019 1030   ALKPHOS 75 09/07/2019 1030   AST 23 09/07/2019 1030   ALT 26 09/07/2019 1030   BILITOT 0.5 09/07/2019 1030      Results for PHELAN, KVAM (MRN  [id number]) as of 11/11/2019 15:13  Ref. Range 07/29/2019 11:16 09/07/2019 10:30  Prostate Specific Ag, Serum Latest Ref Range: 0.0 - 4.0 ng/mL 12.3 (H) 1.2    Impression and Plan:   59 year old man with:   1.  Castration-resistant prostate cancer with disease to the bone noted in January 2021.    The natural course of this disease was updated at this time.  Risks and benefits of continuing Xtandi were reviewed.  Complication associated with this medication long-term including fatigue, weight gain, hypertension and rarely seizures.  Alternative options such as systemic chemotherapy will be deferred he has progression of disease.  He is agreeable to continue this medication and agreeable with this plan.  2.  Androgen deprivation: Eligard given on September 09, 2019 and will be repeated in 4 months.  Complication associated with this medication planning on flashes and weight gain were reiterated.  He is agreeable to continue I recommended continuing this indefinitely.  3.  Bone health: I recommended continuing calcium and vitamin D supplements.  Additional treatment with Delton See will be considered if he has worsening metastatic disease.  4.  Hypertension: Blood pressure is within normal range at this time.  5.  Follow-up: In 2 months for repeat evaluation and Eligard injection.  30  minutes were spent on this encounter.  The time was dedicated to updating his disease status, reviewing complication related to therapy as well as discussing alternative options and plan of care.    Zola Button, MD 5/14/20213:10 PM

## 2019-11-12 LAB — PROSTATE-SPECIFIC AG, SERUM (LABCORP): Prostate Specific Ag, Serum: 0.6 ng/mL (ref 0.0–4.0)

## 2019-11-14 ENCOUNTER — Telehealth: Payer: Self-pay | Admitting: Oncology

## 2019-11-14 NOTE — Telephone Encounter (Signed)
Scheduled appt per 5/14 los.  Spoke with pt and they are aware of the appt date and time.

## 2019-11-19 ENCOUNTER — Other Ambulatory Visit: Payer: Self-pay | Admitting: Oncology

## 2019-11-19 DIAGNOSIS — C61 Malignant neoplasm of prostate: Secondary | ICD-10-CM

## 2019-12-15 ENCOUNTER — Other Ambulatory Visit: Payer: Self-pay | Admitting: *Deleted

## 2019-12-15 DIAGNOSIS — C61 Malignant neoplasm of prostate: Secondary | ICD-10-CM

## 2019-12-15 MED ORDER — XTANDI 40 MG PO CAPS: 160.0000 mg | ORAL_CAPSULE | Freq: Every day | ORAL | 3 refills | Status: DC

## 2019-12-15 NOTE — Progress Notes (Signed)
Faxed refill of Xtandi 40 mg Qty 120 refills 3 to [facility].

## 2020-01-11 ENCOUNTER — Inpatient Hospital Stay: Payer: 59 | Admitting: Oncology

## 2020-01-11 ENCOUNTER — Inpatient Hospital Stay: Payer: 59

## 2020-01-11 ENCOUNTER — Inpatient Hospital Stay: Payer: 59 | Attending: Oncology

## 2020-01-11 ENCOUNTER — Other Ambulatory Visit: Payer: Self-pay

## 2020-01-11 VITALS — BP 140/60 | HR 55 | Temp 97.5°F | Resp 18 | Wt 246.1 lb

## 2020-01-11 DIAGNOSIS — C61 Malignant neoplasm of prostate: Secondary | ICD-10-CM

## 2020-01-11 DIAGNOSIS — Z5111 Encounter for antineoplastic chemotherapy: Secondary | ICD-10-CM | POA: Diagnosis not present

## 2020-01-11 DIAGNOSIS — C7951 Secondary malignant neoplasm of bone: Secondary | ICD-10-CM | POA: Insufficient documentation

## 2020-01-11 LAB — CBC WITH DIFFERENTIAL (CANCER CENTER ONLY)
Abs Immature Granulocytes: 0.06 10*3/uL (ref 0.00–0.07)
Basophils Absolute: 0 10*3/uL (ref 0.0–0.1)
Basophils Relative: 0 %
Eosinophils Absolute: 0.1 10*3/uL (ref 0.0–0.5)
Eosinophils Relative: 2 %
HCT: 40.1 % (ref 39.0–52.0)
Hemoglobin: 13.7 g/dL (ref 13.0–17.0)
Immature Granulocytes: 1 %
Lymphocytes Relative: 15 %
Lymphs Abs: 1 10*3/uL (ref 0.7–4.0)
MCH: 31.1 pg (ref 26.0–34.0)
MCHC: 34.2 g/dL (ref 30.0–36.0)
MCV: 91.1 fL (ref 80.0–100.0)
Monocytes Absolute: 0.6 10*3/uL (ref 0.1–1.0)
Monocytes Relative: 8 %
Neutro Abs: 4.9 10*3/uL (ref 1.7–7.7)
Neutrophils Relative %: 74 %
Platelet Count: 186 10*3/uL (ref 150–400)
RBC: 4.4 MIL/uL (ref 4.22–5.81)
RDW: 13.1 % (ref 11.5–15.5)
WBC Count: 6.7 10*3/uL (ref 4.0–10.5)
nRBC: 0 % (ref 0.0–0.2)

## 2020-01-11 LAB — CMP (CANCER CENTER ONLY)
ALT: 23 U/L (ref 0–44)
AST: 17 U/L (ref 15–41)
Albumin: 3.8 g/dL (ref 3.5–5.0)
Alkaline Phosphatase: 58 U/L (ref 38–126)
Anion gap: 13 (ref 5–15)
BUN: 22 mg/dL — ABNORMAL HIGH (ref 6–20)
CO2: 21 mmol/L — ABNORMAL LOW (ref 22–32)
Calcium: 9.6 mg/dL (ref 8.9–10.3)
Chloride: 107 mmol/L (ref 98–111)
Creatinine: 0.94 mg/dL (ref 0.61–1.24)
GFR, Est AFR Am: >60 mL/min (ref >60)
GFR, Estimated: >60 mL/min (ref >60)
Glucose, Bld: 194 mg/dL — ABNORMAL HIGH (ref 70–99)
Potassium: 4.3 mmol/L (ref 3.5–5.1)
Sodium: 141 mmol/L (ref 135–145)
Total Bilirubin: 0.4 mg/dL (ref 0.3–1.2)
Total Protein: 7 g/dL (ref 6.5–8.1)

## 2020-01-11 MED ORDER — CALCIUM CARBONATE-VITAMIN D 500-200 MG-UNIT PO TABS: 1.0000 | ORAL_TABLET | Freq: Two times a day (BID) | ORAL | 3 refills | Status: DC

## 2020-01-11 MED ORDER — LEUPROLIDE ACETATE (4 MONTH) 30 MG ~~LOC~~ KIT
PACK | SUBCUTANEOUS | Status: AC
  Filled 2020-01-11: qty 30

## 2020-01-11 MED ORDER — LEUPROLIDE ACETATE (4 MONTH) 30 MG ~~LOC~~ KIT
30.0000 mg | PACK | Freq: Once | SUBCUTANEOUS | Status: AC
  Administered 2020-01-11: 30 mg via SUBCUTANEOUS

## 2020-01-11 NOTE — Progress Notes (Signed)
Hematology and Oncology Follow Up Visit  Travis Bowman [id number] [birth year] 59 y.o. 01/11/2020 8:12 AM Koirala, [name], MDKoirala, [name], MD   Principle Diagnosis: 59 year old man with advanced prostate cancer with disease to the bone diagnosed in 2017.  He has castration-resistant after presenting 2017 with Gleason score 4+5 = 9 and a PSA 4.59.   Prior Therapy:   He is status post robotic-assisted laparoscopic radical prostatectomy and bilateral extended pelvic lymphadenectomy on 01/28/2016. The right posterior soft tissue margins were positive and 2 out of 12 lymph nodes on the right side involved with cancer. 0 out of 10 lymph nodes in the left side had any cancer involvement.  He is status post radiation therapy between November 2017 in January 2018.  He had 45 Gy to the prostate and pelvic lymph nodes with additional 23.4 Gy prostatic fossa boost.  He also completed 6 months of androgen deprivation.  His PSA remained undetectable until April 2019 which was 1.38.  He was started on androgen deprivation at that time.  He developed castration-resistant disease in January 2021 after increase in his PSA with a bone scan showed limited bone disease involvement.   Current therapy:   Lupron 30 mg every 4 months started in July 2019.  He is currently on Eligard and next injection scheduled for July 2021.   Xtandi 160 mg daily started in January 2021.  Interim History: Travis Bowman is here for return follow-up.  Since the last visit, he reports feeling well without any recent complaints.  Any nausea vomiting or abdominal pain.  He denies any complication related to [facility].  Denies any excessive fatigue or tiredness.  Denies any bone pain or pathological fractures.  Continues to be active and attends activities of daily living.      .  Medications: Reviewed without changes. Current Outpatient Medications  Medication Sig Dispense Refill  . enzalutamide (XTANDI) 40 MG capsule Take 4 capsules  (160 mg total) by mouth daily. 120 capsule 3  . glimepiride (AMARYL) 2 MG tablet Take 2 mg by mouth daily with breakfast.    . lisinopril (PRINIVIL,ZESTRIL) 40 MG tablet Take 1 tablet by mouth every evening.   3   No current facility-administered medications for this visit.     Allergies:  Allergies  Allergen Reactions  . Morphine And Related Nausea And Vomiting      Physical Exam:      Blood pressure 140/60, pulse (!) 55, temperature (!) 97.5 F (36.4 C), temperature source Temporal, resp. rate 18, weight 246 lb 1.6 oz (111.6 kg), SpO2 98 %.      ECOG: 0    General appearance: Alert, awake without any distress. Head: Atraumatic without abnormalities Oropharynx: Without any thrush or ulcers. Eyes: No scleral icterus. Lymph nodes: No lymphadenopathy noted in the cervical, supraclavicular, or axillary nodes Heart:regular rate and rhythm, without any murmurs or gallops.   Lung: Clear to auscultation without any rhonchi, wheezes or dullness to percussion. Abdomin: Soft, nontender without any shifting dullness or ascites. Musculoskeletal: No clubbing or cyanosis. Neurological: No motor or sensory deficits. Skin: No rashes or lesions.       .   Lab Results: Lab Results  Component Value Date   WBC 8.6 11/11/2019   HGB 14.6 11/11/2019   HCT 41.4 11/11/2019   MCV 90.2 11/11/2019   PLT 211 11/11/2019     Chemistry      Component Value Date/Time   NA 141 11/11/2019 1505   K 4.0 11/11/2019 1505  CL 103 11/11/2019 1505   CO2 25 11/11/2019 1505   BUN 16 11/11/2019 1505   CREATININE 1.00 11/11/2019 1505      Component Value Date/Time   CALCIUM 9.7 11/11/2019 1505   ALKPHOS 68 11/11/2019 1505   AST 21 11/11/2019 1505   ALT 24 11/11/2019 1505   BILITOT 0.4 11/11/2019 1505      Results for Travis Bowman, Travis Bowman (MRN [id number]) as of 01/11/2020 08:13  Ref. Range 07/29/2019 11:16 09/07/2019 10:30 11/11/2019 15:05  Prostate Specific Ag, Serum Latest Ref Range: 0.0  - 4.0 ng/mL 12.3 (H) 1.2 0.6     Impression and Plan:   59 year old man with:   1.  Advanced prostate cancer with disease to the bone diagnosed in 2021.  He has castration-resistant at this time.  He continues to tolerate Xtandi with excellent PSA response at this time that has continued to decline without any major complications.  Risks and benefits of continuing this treatment were reviewed today.  Potential complications including hypertension, fatigue and hematuria.  Alternative options would be systemic chemotherapy.  For the time being he is agreeable to continue.  2.  Androgen deprivation: He is currently on Eligard every 4 months.  He will receive one today and repeated in 4 months.  Long-term complications including weight gain, hot flashes and sexual dysfunction were reiterated.  3.  Bone health: He would be a candidate for Xgeva at this time.  We will ask him to obtain dental clearance continue calcium and vitamin D supplement.  He has not seen the dentist in a while.  Once he has completed this part of his health maintenance will consider Xgeva.  4.  Hypertension: His blood pressure is adequate at this time.  5.  Follow-up: Will be in 4 months for repeat follow-up.  30  minutes were dedicated to this visit.  The time was spent on reviewing his disease status, discussing treatment options, addressing complications related to therapy and future plan of care alternatives.    Zola Button, MD 7/14/20218:12 AM

## 2020-01-12 LAB — PROSTATE-SPECIFIC AG, SERUM (LABCORP): Prostate Specific Ag, Serum: 2 ng/mL (ref 0.0–4.0)

## 2020-02-01 ENCOUNTER — Other Ambulatory Visit: Payer: Self-pay

## 2020-02-01 ENCOUNTER — Telehealth: Payer: Self-pay

## 2020-02-01 DIAGNOSIS — C61 Malignant neoplasm of prostate: Secondary | ICD-10-CM

## 2020-02-01 MED ORDER — XTANDI 40 MG PO CAPS: 160.0000 mg | ORAL_CAPSULE | Freq: Every day | ORAL | 3 refills | Status: DC

## 2020-02-01 NOTE — Telephone Encounter (Signed)
TC from Pt. Stating he received a call from [employer] who supplies his Gillermina Phy stating his refill was denied. Informed Pt. That I would give a call to our pharmacy to see if there is a problem. TC to Wynn Maudlin informed her about Pt's situation she stated she will given [employer] a call and once she finds out she will give the Pt. A call

## 2020-05-08 ENCOUNTER — Telehealth: Payer: Self-pay

## 2020-05-08 NOTE — Telephone Encounter (Signed)
Oral Oncology Patient Advocate Encounter  Prior Authorization for Travis Bowman has been approved.    PA# [id number] Effective dates: 05/08/20 through 05/08/21  Patient must use Travis Bowman will continue to follow.   Travis Bowman Patient [name] Phone [phone] Fax [phone] 05/08/2020 1:53 PM

## 2020-05-08 NOTE — Telephone Encounter (Signed)
Oral Oncology Patient Advocate Encounter  Received notification from [place] that prior authorization for [employer] Phy is required.  PA submitted on CoverMyMeds Key [id number] Status is pending  Oral Oncology Clinic will continue to follow.  [place] Patient [place] Phone [phone] Fax [phone] 05/08/2020 1:22 PM

## 2020-05-16 ENCOUNTER — Inpatient Hospital Stay: Payer: 59 | Admitting: Oncology

## 2020-05-16 ENCOUNTER — Inpatient Hospital Stay: Payer: 59

## 2020-05-16 ENCOUNTER — Other Ambulatory Visit: Payer: Self-pay

## 2020-05-16 ENCOUNTER — Inpatient Hospital Stay: Payer: 59 | Attending: Oncology

## 2020-05-16 VITALS — BP 149/62 | HR 64 | Temp 97.8°F | Resp 18 | Wt 250.7 lb

## 2020-05-16 DIAGNOSIS — C7951 Secondary malignant neoplasm of bone: Secondary | ICD-10-CM | POA: Diagnosis present

## 2020-05-16 DIAGNOSIS — C61 Malignant neoplasm of prostate: Secondary | ICD-10-CM | POA: Diagnosis present

## 2020-05-16 DIAGNOSIS — Z5111 Encounter for antineoplastic chemotherapy: Secondary | ICD-10-CM | POA: Insufficient documentation

## 2020-05-16 LAB — CBC WITH DIFFERENTIAL (CANCER CENTER ONLY)
Abs Immature Granulocytes: 0.04 10*3/uL (ref 0.00–0.07)
Basophils Absolute: 0 10*3/uL (ref 0.0–0.1)
Basophils Relative: 0 %
Eosinophils Absolute: 0.1 10*3/uL (ref 0.0–0.5)
Eosinophils Relative: 1 %
HCT: 40.6 % (ref 39.0–52.0)
Hemoglobin: 14 g/dL (ref 13.0–17.0)
Immature Granulocytes: 1 %
Lymphocytes Relative: 13 %
Lymphs Abs: 1 10*3/uL (ref 0.7–4.0)
MCH: 31.4 pg (ref 26.0–34.0)
MCHC: 34.5 g/dL (ref 30.0–36.0)
MCV: 91 fL (ref 80.0–100.0)
Monocytes Absolute: 0.7 10*3/uL (ref 0.1–1.0)
Monocytes Relative: 9 %
Neutro Abs: 5.6 10*3/uL (ref 1.7–7.7)
Neutrophils Relative %: 76 %
Platelet Count: 177 10*3/uL (ref 150–400)
RBC: 4.46 MIL/uL (ref 4.22–5.81)
RDW: 13.2 % (ref 11.5–15.5)
WBC Count: 7.4 10*3/uL (ref 4.0–10.5)
nRBC: 0 % (ref 0.0–0.2)

## 2020-05-16 LAB — CMP (CANCER CENTER ONLY)
ALT: 19 U/L (ref 0–44)
AST: 20 U/L (ref 15–41)
Albumin: 3.8 g/dL (ref 3.5–5.0)
Alkaline Phosphatase: 67 U/L (ref 38–126)
Anion gap: 10 (ref 5–15)
BUN: 18 mg/dL (ref 6–20)
CO2: 23 mmol/L (ref 22–32)
Calcium: 9.3 mg/dL (ref 8.9–10.3)
Chloride: 107 mmol/L (ref 98–111)
Creatinine: 0.97 mg/dL (ref 0.61–1.24)
GFR, Estimated: >60 mL/min (ref >60)
Glucose, Bld: 191 mg/dL — ABNORMAL HIGH (ref 70–99)
Potassium: 4 mmol/L (ref 3.5–5.1)
Sodium: 140 mmol/L (ref 135–145)
Total Bilirubin: 0.4 mg/dL (ref 0.3–1.2)
Total Protein: 7.1 g/dL (ref 6.5–8.1)

## 2020-05-16 MED ORDER — LEUPROLIDE ACETATE (4 MONTH) 30 MG ~~LOC~~ KIT
30.0000 mg | PACK | Freq: Once | SUBCUTANEOUS | Status: AC
  Administered 2020-05-16: 30 mg via SUBCUTANEOUS

## 2020-05-16 MED ORDER — LEUPROLIDE ACETATE (4 MONTH) 30 MG ~~LOC~~ KIT
PACK | SUBCUTANEOUS | Status: AC
  Filled 2020-05-16: qty 30

## 2020-05-16 NOTE — Progress Notes (Signed)
Hematology and Oncology Follow Up Visit  Travis Bowman [id number] [birth year] 59 y.o. 05/16/2020 9:48 AM Koirala, [name], MDKoirala, [name], MD   Principle Diagnosis: 59 year old man with castration-resistant prostate cancer with disease to the bone diagnosed in 2017.  He initially presented with Gleason score 4+5 = 9 and a PSA 4.59 and localized disease.  Prior Therapy:   He is status post robotic-assisted laparoscopic radical prostatectomy and bilateral extended pelvic lymphadenectomy on 01/28/2016. The right posterior soft tissue margins were positive and 2 out of 12 lymph nodes on the right side involved with cancer. 0 out of 10 lymph nodes in the left side had any cancer involvement.  He is status post radiation therapy between November 2017 in January 2018.  He had 45 Gy to the prostate and pelvic lymph nodes with additional 23.4 Gy prostatic fossa boost.  He also completed 6 months of androgen deprivation.  His PSA remained undetectable until April 2019 which was 1.38.  He was started on androgen deprivation at that time.  He developed castration-resistant disease in January 2021 after increase in his PSA with a bone scan showed limited bone disease involvement.   Current therapy:   Lupron 30 mg every 4 months started in July 2019.  He is scheduled to receive Eligard today and repeated in 4 months.   Xtandi 160 mg daily started in January 2021.  Interim History: [name] returns today for a follow-up evaluation.  Since the last visit, he reports no major changes in his health.  He has tolerated Xtandi without any new complaints.  He denies any nausea, vomiting or abdominal pain.  He denies pathological fractures or hospitalizations.  He denies any changes in his quality of life.  He continues to work full-time.  He did have a PSA checked via work and was elevated to 57.      .  Medications: Unchanged on review.. Current Outpatient Medications  Medication Sig Dispense Refill   . amLODipine (NORVASC) 5 MG tablet Take 5 mg by mouth daily.    . calcium-vitamin D (OSCAL WITH D) 500-200 MG-UNIT tablet Take 1 tablet by mouth 2 (two) times daily. 90 tablet 3  . empagliflozin (JARDIANCE) 25 MG TABS tablet Take 25 mg by mouth daily.    . enzalutamide (XTANDI) 40 MG capsule Take 4 capsules (160 mg total) by mouth daily. 120 capsule 3  . glimepiride (AMARYL) 2 MG tablet Take 2 mg by mouth daily with breakfast.    . lisinopril (PRINIVIL,ZESTRIL) 40 MG tablet Take 1 tablet by mouth every evening.   3   No current facility-administered medications for this visit.     Allergies:  Allergies  Allergen Reactions  . Morphine And Related Nausea And Vomiting      Physical Exam:        Blood pressure (!) 149/62, pulse 64, temperature 97.8 F (36.6 C), temperature source Tympanic, resp. rate 18, weight 250 lb 11.2 oz (113.7 kg), SpO2 99 %.     ECOG: 0    General appearance: Comfortable appearing without any discomfort Head: Normocephalic without any trauma Oropharynx: Mucous membranes are moist and pink without any thrush or ulcers. Eyes: Pupils are equal and round reactive to light. Lymph nodes: No cervical, supraclavicular, inguinal or axillary lymphadenopathy.   Heart:regular rate and rhythm.  S1 and S2 without leg edema. Lung: Clear without any rhonchi or wheezes.  No dullness to percussion. Abdomin: Soft, nontender, nondistended with good bowel sounds.  No hepatosplenomegaly. Musculoskeletal: No joint  deformity or effusion.  Full range of motion noted. Neurological: No deficits noted on motor, sensory and deep tendon reflex exam. Skin: No petechial rash or dryness.  Appeared moist.         .   Lab Results: Lab Results  Component Value Date   WBC 6.7 01/11/2020   HGB 13.7 01/11/2020   HCT 40.1 01/11/2020   MCV 91.1 01/11/2020   PLT 186 01/11/2020     Chemistry      Component Value Date/Time   NA 141 01/11/2020 0806   K 4.3 01/11/2020  0806   CL 107 01/11/2020 0806   CO2 21 (L) 01/11/2020 0806   BUN 22 (H) 01/11/2020 0806   CREATININE 0.94 01/11/2020 0806      Component Value Date/Time   CALCIUM 9.6 01/11/2020 0806   ALKPHOS 58 01/11/2020 0806   AST 17 01/11/2020 0806   ALT 23 01/11/2020 0806   BILITOT 0.4 01/11/2020 0806      Results for BEXTON, HAAK (MRN [id number]) as of 05/16/2020 09:51  Ref. Range 11/11/2019 15:05 01/11/2020 08:06  Prostate Specific Ag, Serum Latest Ref Range: 0.0 - 4.0 ng/mL 0.6 2.0      Impression and Plan:   59 year old man with:   1.  Castration-resistant prostate cancer with disease to the bone with initially diagnosis in 2017.  He has tolerated Xtandi reasonably well and had an excellent initial response with a PSA nadir down to 0.6.  PSA in July 2021 was up to 2.0.  Most recently his PSA has increased to 57 from an outside lab.  The natural course of his disease was reviewed and alternative treatment options were discussed.  These would include switching to Zytiga, systemic chemotherapy and Xofigo as a potential salvage options.  I recommended that updating his staging work-up in the near future and make a decision regarding this treatment.  I recommended continuing Xtandi at this point and will decide the best course of action after repeating his PSA today and imaging studies in the near future.  He has visceral metastasis I recommend proceeding with chemotherapy.  He has bone disease only Xofigo would be reasonable.  2.  Androgen deprivation: He will receive Eligard today and repeated every 4 months.  Complications occluding weight gain, hot flashes among others were reviewed.  3.  Bone health: I recommended calcium and vitamin D supplements.  Delton See has been deferred till he obtains dental clearance.  4.  Hypertension: Mildly elevated related to [place].  We will continue to monitor at this time.   5.  Follow-up: He will return in the next few weeks after updating his staging  work-up.  30  minutes were spent on this encounter.  The time was dedicated to reviewing his disease status, discussing treatment options and future plan of care review.    Zola Button, MD 11/17/20219:48 AM

## 2020-05-17 ENCOUNTER — Telehealth: Payer: Self-pay

## 2020-05-17 LAB — PROSTATE-SPECIFIC AG, SERUM (LABCORP): Prostate Specific Ag, Serum: 57.4 ng/mL — ABNORMAL HIGH (ref 0.0–4.0)

## 2020-05-17 NOTE — Telephone Encounter (Signed)
Informed patient about his PSA.

## 2020-05-17 NOTE — Telephone Encounter (Signed)
-----   Message from Wyatt Portela, MD sent at 05/17/2020  9:53 AM EST ----- Please let him know his PSA is up as we expected.

## 2020-06-01 ENCOUNTER — Other Ambulatory Visit: Payer: Self-pay

## 2020-06-01 ENCOUNTER — Encounter (HOSPITAL_COMMUNITY)
Admission: RE | Admit: 2020-06-01 | Discharge: 2020-06-01 | Disposition: A | Discharge status: Home / Self Care | Payer: 59 | Source: Ambulatory Visit | Attending: Oncology | Admitting: Oncology

## 2020-06-01 ENCOUNTER — Ambulatory Visit (HOSPITAL_COMMUNITY)
Admission: RE | Admit: 2020-06-01 | Discharge: 2020-06-01 | Disposition: A | Discharge status: Home / Self Care | Payer: 59 | Source: Ambulatory Visit | Attending: Oncology | Admitting: Oncology

## 2020-06-01 ENCOUNTER — Encounter (HOSPITAL_COMMUNITY): Payer: Self-pay

## 2020-06-01 DIAGNOSIS — C61 Malignant neoplasm of prostate: Secondary | ICD-10-CM

## 2020-06-01 MED ORDER — IOHEXOL 300 MG/ML  SOLN
100.0000 mL | Freq: Once | INTRAMUSCULAR | Status: AC | PRN
  Administered 2020-06-01: 100 mL via INTRAVENOUS

## 2020-06-04 ENCOUNTER — Other Ambulatory Visit: Payer: Self-pay

## 2020-06-04 ENCOUNTER — Inpatient Hospital Stay: Payer: 59 | Attending: Oncology | Admitting: Oncology

## 2020-06-04 VITALS — BP 147/64 | HR 58 | Temp 98.0°F | Resp 18 | Ht 68.0 in | Wt 249.5 lb

## 2020-06-04 DIAGNOSIS — C7951 Secondary malignant neoplasm of bone: Secondary | ICD-10-CM | POA: Diagnosis present

## 2020-06-04 DIAGNOSIS — Z5111 Encounter for antineoplastic chemotherapy: Secondary | ICD-10-CM | POA: Insufficient documentation

## 2020-06-04 DIAGNOSIS — Z7189 Other specified counseling: Secondary | ICD-10-CM

## 2020-06-04 DIAGNOSIS — C61 Malignant neoplasm of prostate: Secondary | ICD-10-CM | POA: Diagnosis present

## 2020-06-04 MED ORDER — PROCHLORPERAZINE MALEATE 10 MG PO TABS: 10.0000 mg | ORAL_TABLET | Freq: Four times a day (QID) | ORAL | 0 refills | Status: DC | PRN

## 2020-06-04 NOTE — Progress Notes (Signed)
DISCONTINUE ON PATHWAY REGIMEN - Prostate     A cycle is every 3 months:     Leuprolide acetate        Dose Mod: None  **Always confirm dose/schedule in your pharmacy ordering system**  REASON: Disease Progression PRIOR TREATMENT: POS80: Ablation with LHRH Agonist x 24 Months (Additional Non-Steroidal Anti-Androgen at Physician Discretion) TREATMENT RESPONSE: Complete Response (CR)  START ON PATHWAY REGIMEN - Prostate     A cycle is every 21 days:     Docetaxel      Prednisone   **Always confirm dose/schedule in your pharmacy ordering system**  Patient Characteristics: Adenocarcinoma, Recurrent/New Systemic Disease, Castration Resistant, M1, Prior Novel Hormonal Agent, No Molecular Alteration or Targeted Therapy Exhausted, No Prior Docetaxel Histology: Adenocarcinoma Therapeutic Status: Recurrent/New Systemic Disease  Intent of Therapy: Non-Curative / Palliative Intent, Discussed with Patient

## 2020-06-04 NOTE — Progress Notes (Signed)
Hematology and Oncology Follow Up Visit  Travis Bowman [id number] 18-Dec-1960 59 y.o. 06/04/2020 10:10 AM Koirala, [name], MDKoirala, [name], MD   Principle Diagnosis: 59 year old man with advanced prostate cancer with disease to the bone and lymphadenopathy diagnosed in 2017.  He has castration-resistant after presenting with Gleason score 4+5 = 9 and a PSA 4.59 at the time of diagnosis.  Prior Therapy:   He is status post robotic-assisted laparoscopic radical prostatectomy and bilateral extended pelvic lymphadenectomy on 01/28/2016. The right posterior soft tissue margins were positive and 2 out of 12 lymph nodes on the right side involved with cancer. 0 out of 10 lymph nodes in the left side had any cancer involvement.  He is status post radiation therapy between November 2017 in January 2018.  He had 45 Gy to the prostate and pelvic lymph nodes with additional 23.4 Gy prostatic fossa boost.  He also completed 6 months of androgen deprivation.  His PSA remained undetectable until April 2019 which was 1.38.  He was started on androgen deprivation at that time.  He developed castration-resistant disease in January 2021 after increase in his PSA with a bone scan showed limited bone disease involvement.   Current therapy:   Lupron 30 mg every 4 months started in July 2019.  He is scheduled to receive Eligard today and repeated in 4 months.   Xtandi 160 mg daily started in January 2021.  Interim History: Travis Bowman is here for a repeat evaluation.  Since the last visit, he reports no major changes in his health.  He has reported left inguinal lymph node enlargement but not interfering with his function.  He denies any nausea vomiting or abdominal pain.  Denies any weight loss or appetite changes.  His performance status quality of life remains unchanged.     .  Medications: Updated on review. Current Outpatient Medications  Medication Sig Dispense Refill  . amLODipine (NORVASC) 5 MG  tablet Take 5 mg by mouth daily.    . calcium-vitamin D (OSCAL WITH D) 500-200 MG-UNIT tablet Take 1 tablet by mouth 2 (two) times daily. 90 tablet 3  . empagliflozin (JARDIANCE) 25 MG TABS tablet Take 25 mg by mouth daily.    . enzalutamide (XTANDI) 40 MG capsule Take 4 capsules (160 mg total) by mouth daily. 120 capsule 3  . glimepiride (AMARYL) 2 MG tablet Take 2 mg by mouth daily with breakfast.    . lisinopril (PRINIVIL,ZESTRIL) 40 MG tablet Take 1 tablet by mouth every evening.   3   No current facility-administered medications for this visit.     Allergies:  Allergies  Allergen Reactions  . Morphine And Related Nausea And Vomiting      Physical Exam:    Blood pressure (!) 147/64, pulse (!) 58, temperature 98 F (36.7 C), temperature source Tympanic, resp. rate 18, height 5\' 8"  (1.727 m), weight 249 lb 8 oz (113.2 kg), SpO2 97 %.          ECOG: 0    General appearance: Alert, awake without any distress. Head: Atraumatic without abnormalities Oropharynx: Without any thrush or ulcers. Eyes: No scleral icterus. Lymph nodes: No lymphadenopathy noted in the cervical, supraclavicular, or axillary nodes Heart:regular rate and rhythm, without any murmurs or gallops.   Lung: Clear to auscultation without any rhonchi, wheezes or dullness to percussion. Abdomin: Soft, nontender without any shifting dullness or ascites. Musculoskeletal: No clubbing or cyanosis. Neurological: No motor or sensory deficits. Skin: No rashes or lesions.        [name]  Lab Results: Lab Results  Component Value Date   WBC 7.4 05/16/2020   HGB 14.0 05/16/2020   HCT 40.6 05/16/2020   MCV 91.0 05/16/2020   PLT 177 05/16/2020     Chemistry      Component Value Date/Time   NA 140 05/16/2020 0949   K 4.0 05/16/2020 0949   CL 107 05/16/2020 0949   CO2 23 05/16/2020 0949   BUN 18 05/16/2020 0949   CREATININE 0.97 05/16/2020 0949      Component Value Date/Time   CALCIUM 9.3  05/16/2020 0949   ALKPHOS 67 05/16/2020 0949   AST 20 05/16/2020 0949   ALT 19 05/16/2020 0949   BILITOT 0.4 05/16/2020 0949      Results for Travis Bowman, Travis Bowman (MRN [id number]) as of 06/04/2020 10:11  Ref. Range 01/11/2020 08:06 05/16/2020 09:49  Prostate Specific Ag, Serum Latest Ref Range: 0.0 - 4.0 ng/mL 2.0 57.4 (H)   IMPRESSION: 1. Numerous enlarged lymph nodes throughout the chest, abdomen, and pelvis as detailed above, all lymphadenopathy new compared to most recent imaging, of the chest dated 12/04/2017 and of the abdomen and pelvis dated 07/29/2019. This is most consistent with metastatic prostate malignancy given clinical history, however the particular presence of bulky right hilar and mediastinal lymphadenopathy does raise the possibility of primary lung malignancy. Consider tissue sampling. 2. Numerous small bilateral pulmonary nodules, which within the limitations of comparison to prior free breathing PET-CT do not appear to be significantly changed, largest nodule in the right upper lobe measuring 7 mm. These are nonspecific, most likely to be infectious or inflammatory in nature, pulmonary metastatic disease an uncommon finding in the setting of primary prostate malignancy. Attention on follow-up. 3. Subtly sclerotic osseous metastatic disease as better characterized by same-day nuclear scintigraphic bone scan. There is a new, subacute fracture of the posterior right tenth rib without definite associated osseous metastatic lesion. 4. Status post prostatectomy. 5. Mild splenomegaly. 6. Coronary artery disease.  IMPRESSION: Mixed response to therapy with areas of tumor response and progressive disease as outlined above.   Impression and Plan:   59 year old man with:   1.  Advanced prostate cancer with disease to the bone and lymphadenopathy diagnosed in 2017.  He has castration-resistant at this time.  The natural course of his disease was updated.  His PSA  showed a dramatic increase up to 57 with imaging studies completed on June 01, 2020 showed progression of disease.  He has widespread lymphadenopathy with questionable bone metastasis as well.  Given his disease progression, switching treatment is mandatory at this time.  Treatment options were discussed.  These would include systemic chemotherapy, Xofigo among others.  Given the rapid increase in his tumor burden, I recommended proceeding with systemic chemotherapy.  Risks and benefits of this approach were discussed.  Complication associated with this treatment, nausea, vomiting, myelosuppression, neutropenia possible sepsis.  After discussion today he is agreeable to proceed and he will discontinue Xtandi.  2.  Androgen deprivation: He received Eligard on November 17 and will be repeated in 4 months.  3.  Bone health: Risks and benefits of Xgeva were reviewed at this time.  This will be started after obtaining dental clearance.  I recommended calcium and vitamin D supplement interim.  4.  Goals of care: Treatment is palliative at this time although aggressive measures are warranted given his excellent performance status.  5.  Antiemetics: Prescription for Compazine will be available to him.  6.  IV access: Risks and benefits of  a Port-A-Cath insertion were reviewed.  Complications including bleeding, thrombosis and infection were reiterated.  He has deferred that option for the time being but I urged him to reconsider.  7.  Neutropenia prophylaxis: We will receive growth factor support after each cycle of therapy.   8.  Follow-up: will be in the immediate future to start for cycle of Taxotere.  30  minutes were dedicated to this visit.  Time spent on reviewing his disease status, discussing treatment options, reviewing imaging studies as well as future plan of care discussion.    Zola Button, MD 12/6/202110:10 AM

## 2020-06-08 ENCOUNTER — Other Ambulatory Visit: Payer: Self-pay | Admitting: *Deleted

## 2020-06-08 ENCOUNTER — Inpatient Hospital Stay: Payer: 59

## 2020-06-08 ENCOUNTER — Other Ambulatory Visit: Payer: Self-pay

## 2020-06-12 ENCOUNTER — Telehealth: Payer: Self-pay

## 2020-06-12 ENCOUNTER — Other Ambulatory Visit: Payer: Self-pay | Admitting: Oncology

## 2020-06-12 DIAGNOSIS — C61 Malignant neoplasm of prostate: Secondary | ICD-10-CM

## 2020-06-12 NOTE — Telephone Encounter (Signed)
Called and spoke to [employer] at [facility] and set up home delivery of Neulasta. Broadus John made aware that the  first injection is needed on 06/15/20. Called patient and made him aware that Neulasta injections will have to be done in the home. 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 Patient is aware to expect a call from [facility] to set up delivery of Neulasta. Discussed over the phone with patient how to give Neulasta and patient verbalized understanding. Also gave patient the number to [place] line in case he wants a pharmacist or nurse to go over administration instructions.

## 2020-06-12 NOTE — Telephone Encounter (Signed)
-----   Message from Tora Kindred, [facility] sent at 06/12/2020 11:01 AM EST ----- Regarding: RE: 12/17 Brendolyn Patty FYI - Lanelle Bal ----- Message ----- From: Blenda Peals Sent: 06/12/2020  10:32 AM EST To: Tora Kindred, RPH, Elliot Gault, # Subject: RE: 12/17 Brendolyn Patty                             That is correct. The patient can not get it at the facility. It will need to be rxd to specialty pharmacy.  ----- Message ----- From: Tora Kindred, [facility] Sent: 06/12/2020   9:36 AM EST To: Elliot Gault, Wyatt Portela, MD, # Subject: RE: 12/17 Vickii Chafe, could you clarify- does the Neulasta need to be Rx'd to specialty?  Pt cannot get Neulasta here at [facility], correct? Thanks, GT  ----- Message ----- From: Wyatt Portela, MD Sent: 06/12/2020   9:14 AM EST To: Shelda Pal, # Subject: RE: 12/17 Fulphila                             Ok to switch to Neulasta.  Thanks ----- Message ----- From: Blenda Peals Sent: 06/12/2020   9:09 AM EST To: Elliot Gault, Wyatt Portela, MD, # Subject: 12/17 Fulphila                                 Patient has [employer] and is on the schedule on 12/17 for Fulphila.   Fulphila needs to go through [facility], specialty pharmacy. Also, Neulasta and Ziextenzo are the preferred.     Thanks.

## 2020-06-12 NOTE — Telephone Encounter (Signed)
Received a fax from [facility] stating [facility] is not contracted to service this patient and per plan patient must use [facility] to dispense Neulasta. Called and spoke to [place] at [facility] [phone]) and Julien Nordmann, [facility] to give order for Neulasta per Dr. Alen Blew. Per [facility], [facility] Neulasta will be shipped to the patient.

## 2020-06-13 ENCOUNTER — Encounter: Payer: Self-pay | Admitting: Oncology

## 2020-06-13 ENCOUNTER — Inpatient Hospital Stay: Payer: 59

## 2020-06-13 ENCOUNTER — Other Ambulatory Visit: Payer: Self-pay

## 2020-06-13 ENCOUNTER — Ambulatory Visit (HOSPITAL_BASED_OUTPATIENT_CLINIC_OR_DEPARTMENT_OTHER): Payer: 59 | Admitting: Medical

## 2020-06-13 VITALS — BP 139/66 | HR 58 | Temp 98.4°F | Resp 18 | Ht 68.0 in | Wt 249.0 lb

## 2020-06-13 DIAGNOSIS — C61 Malignant neoplasm of prostate: Secondary | ICD-10-CM

## 2020-06-13 DIAGNOSIS — Z5111 Encounter for antineoplastic chemotherapy: Secondary | ICD-10-CM | POA: Diagnosis not present

## 2020-06-13 DIAGNOSIS — T8090XA Unspecified complication following infusion and therapeutic injection, initial encounter: Secondary | ICD-10-CM

## 2020-06-13 LAB — CMP (CANCER CENTER ONLY)
ALT: 19 U/L (ref 0–44)
AST: 14 U/L — ABNORMAL LOW (ref 15–41)
Albumin: 3.6 g/dL (ref 3.5–5.0)
Alkaline Phosphatase: 70 U/L (ref 38–126)
Anion gap: 11 (ref 5–15)
BUN: 23 mg/dL — ABNORMAL HIGH (ref 6–20)
CO2: 22 mmol/L (ref 22–32)
Calcium: 9.8 mg/dL (ref 8.9–10.3)
Chloride: 105 mmol/L (ref 98–111)
Creatinine: 1.21 mg/dL (ref 0.61–1.24)
GFR, Estimated: >60 mL/min (ref >60)
Glucose, Bld: 227 mg/dL — ABNORMAL HIGH (ref 70–99)
Potassium: 4 mmol/L (ref 3.5–5.1)
Sodium: 138 mmol/L (ref 135–145)
Total Bilirubin: 0.6 mg/dL (ref 0.3–1.2)
Total Protein: 7.1 g/dL (ref 6.5–8.1)

## 2020-06-13 LAB — CBC WITH DIFFERENTIAL (CANCER CENTER ONLY)
Abs Immature Granulocytes: 0.05 10*3/uL (ref 0.00–0.07)
Basophils Absolute: 0 10*3/uL (ref 0.0–0.1)
Basophils Relative: 0 %
Eosinophils Absolute: 0.1 10*3/uL (ref 0.0–0.5)
Eosinophils Relative: 1 %
HCT: 42.1 % (ref 39.0–52.0)
Hemoglobin: 14.2 g/dL (ref 13.0–17.0)
Immature Granulocytes: 1 %
Lymphocytes Relative: 14 %
Lymphs Abs: 1 10*3/uL (ref 0.7–4.0)
MCH: 30.5 pg (ref 26.0–34.0)
MCHC: 33.7 g/dL (ref 30.0–36.0)
MCV: 90.5 fL (ref 80.0–100.0)
Monocytes Absolute: 0.6 10*3/uL (ref 0.1–1.0)
Monocytes Relative: 9 %
Neutro Abs: 5.5 10*3/uL (ref 1.7–7.7)
Neutrophils Relative %: 75 %
Platelet Count: 182 10*3/uL (ref 150–400)
RBC: 4.65 MIL/uL (ref 4.22–5.81)
RDW: 13.1 % (ref 11.5–15.5)
WBC Count: 7.4 10*3/uL (ref 4.0–10.5)
nRBC: 0 % (ref 0.0–0.2)

## 2020-06-13 MED ORDER — SODIUM CHLORIDE 0.9 % IV SOLN
Freq: Once | INTRAVENOUS | Status: DC | PRN
  Filled 2020-06-13: qty 250

## 2020-06-13 MED ORDER — SODIUM CHLORIDE 0.9 % IV SOLN
10.0000 mg | Freq: Once | INTRAVENOUS | Status: AC
  Administered 2020-06-13: 10 mg via INTRAVENOUS
  Filled 2020-06-13: qty 10

## 2020-06-13 MED ORDER — DIPHENHYDRAMINE HCL 50 MG/ML IJ SOLN
50.0000 mg | Freq: Once | INTRAMUSCULAR | Status: AC | PRN
  Administered 2020-06-13: 50 mg via INTRAVENOUS

## 2020-06-13 MED ORDER — SODIUM CHLORIDE 0.9 % IV SOLN
Freq: Once | INTRAVENOUS | Status: AC
  Filled 2020-06-13: qty 250

## 2020-06-13 MED ORDER — METHYLPREDNISOLONE SODIUM SUCC 125 MG IJ SOLR
125.0000 mg | Freq: Once | INTRAMUSCULAR | Status: AC | PRN
  Administered 2020-06-13: 60 mg via INTRAVENOUS

## 2020-06-13 MED ORDER — FAMOTIDINE IN NACL 20-0.9 MG/50ML-% IV SOLN
20.0000 mg | Freq: Once | INTRAVENOUS | Status: AC | PRN
  Administered 2020-06-13: 20 mg via INTRAVENOUS

## 2020-06-13 MED ORDER — SODIUM CHLORIDE 0.9 % IV SOLN
75.0000 mg/m2 | Freq: Once | INTRAVENOUS | Status: AC
  Administered 2020-06-13: 170 mg via INTRAVENOUS
  Filled 2020-06-13: qty 17

## 2020-06-13 NOTE — Progress Notes (Signed)
Met w/ pt to introduce myself as his Arboriculturist.  Unfortunately there aren't any foundations offering copay assistance for his Dx.  I offered the [employer], went over what it covers, gave him the income requirement and an expense sheet.  He stated he exceeds that amount so he doesn't qualify for the grant at this time.  I gave him my card in case his situation changes and for any questions or concerns he may have in the future.

## 2020-06-13 NOTE — Progress Notes (Signed)
Patient here to receive first time dose of taxotere. Patient given 10mg  IV decadron as pre-medication, once that was hung and 30 min wait was up chemo was hung. Less than a minute into the infusion patient was showing signs of a reaction; redness, warmth, severe back pain, hypertension, and SOB. Drug was immediately stopped and NS was hung along with 20mg  Pepcid (see MAR) and pt was placed on 2LNC. Sandi Mealy, PA, was called and charge nurse was notified. Patient also received 50mg  benadryl and 60mg  solumedrol (see MAR). Pt vitals were documented every 5 mins, remained WDL after initial reaction reading. Taxotere was restarted with Sandi Mealy present and vitals were monitored per protocol, Boyd was no longer needed. Pt tolerated remainder of infusion well, pt given instructions and number for symptom management. Patient left stable and ambulatory.

## 2020-06-13 NOTE — Patient Instructions (Signed)
Docetaxel injection What is this medicine? DOCETAXEL (doe se TAX el) is a chemotherapy drug. It targets fast dividing cells, like cancer cells, and causes these cells to die. This medicine is used to treat many types of cancers like breast cancer, certain stomach cancers, head and neck cancer, lung cancer, and prostate cancer. This medicine may be used for other purposes; ask your health care provider or pharmacist if you have questions. COMMON BRAND NAME(S): Docefrez, Taxotere What should I tell my health care provider before I take this medicine? They need to know if you have any of these conditions:  infection (especially a virus infection such as chickenpox, cold sores, or herpes)  liver disease  low blood counts, like low white cell, platelet, or red cell counts  an unusual or allergic reaction to docetaxel, polysorbate 80, other chemotherapy agents, other medicines, foods, dyes, or preservatives  pregnant or trying to get pregnant  breast-feeding How should I use this medicine? This drug is given as an infusion into a vein. It is administered in a hospital or clinic by a specially trained health care professional. Talk to your pediatrician regarding the use of this medicine in children. Special care may be needed. Overdosage: If you think you have taken too much of this medicine contact a poison control center or emergency room at once. NOTE: This medicine is only for you. Do not share this medicine with others. What if I miss a dose? It is important not to miss your dose. Call your doctor or health care professional if you are unable to keep an appointment. What may interact with this medicine?  aprepitant  certain antibiotics like erythromycin or clarithromycin  certain antivirals for HIV or hepatitis  certain medicines for fungal infections like fluconazole, itraconazole, ketoconazole, posaconazole, or  voriconazole  cimetidine  ciprofloxacin  conivaptan  cyclosporine  dronedarone  fluvoxamine  grapefruit juice  imatinib  verapamil This list may not describe all possible interactions. Give your health care provider a list of all the medicines, herbs, non-prescription drugs, or dietary supplements you use. Also tell them if you smoke, drink alcohol, or use illegal drugs. Some items may interact with your medicine. What should I watch for while using this medicine? Your condition will be monitored carefully while you are receiving this medicine. You will need important blood work done while you are taking this medicine. Call your doctor or health care professional for advice if you get a fever, chills or sore throat, or other symptoms of a cold or flu. Do not treat yourself. This drug decreases your body's ability to fight infections. Try to avoid being around people who are sick. Some products may contain alcohol. Ask your health care professional if this medicine contains alcohol. Be sure to tell all health care professionals you are taking this medicine. Certain medicines, like metronidazole and disulfiram, can cause an unpleasant reaction when taken with alcohol. The reaction includes flushing, headache, nausea, vomiting, sweating, and increased thirst. The reaction can last from 30 minutes to several hours. You may get drowsy or dizzy. Do not drive, use machinery, or do anything that needs mental alertness until you know how this medicine affects you. Do not stand or sit up quickly, especially if you are an older patient. This reduces the risk of dizzy or fainting spells. Alcohol may interfere with the effect of this medicine. Talk to your health care professional about your risk of cancer. You may be more at risk for certain types of cancer if   you take this medicine. Do not become pregnant while taking this medicine or for 6 months after stopping it. Women should inform their doctor if  they wish to become pregnant or think they might be pregnant. There is a potential for serious side effects to an unborn child. Talk to your health care professional or pharmacist for more information. Do not breast-feed an infant while taking this medicine or for 1 week after stopping it. Males who get this medicine must use a condom during sex with females who can get pregnant. If you get a woman pregnant, the baby could have birth defects. The baby could die before they are born. You will need to continue wearing a condom for 3 months after stopping the medicine. Tell your health care provider right away if your partner becomes pregnant while you are taking this medicine. This may interfere with the ability to father a child. You should talk to your doctor or health care professional if you are concerned about your fertility. What side effects may I notice from receiving this medicine? Side effects that you should report to your doctor or health care professional as soon as possible:  allergic reactions like skin rash, itching or hives, swelling of the face, lips, or tongue  blurred vision  breathing problems  changes in vision  low blood counts - This drug may decrease the number of white blood cells, red blood cells and platelets. You may be at increased risk for infections and bleeding.  nausea and vomiting  pain, redness or irritation at site where injected  pain, tingling, numbness in the hands or feet  redness, blistering, peeling, or loosening of the skin, including inside the mouth  signs of decreased platelets or bleeding - bruising, pinpoint red spots on the skin, black, tarry stools, nosebleeds  signs of decreased red blood cells - unusually weak or tired, fainting spells, lightheadedness  signs of infection - fever or chills, cough, sore throat, pain or difficulty passing urine  swelling of the ankle, feet, hands Side effects that usually do not require medical attention  (report to your doctor or health care professional if they continue or are bothersome):  constipation  diarrhea  fingernail or toenail changes  hair loss  loss of appetite  mouth sores  muscle pain This list may not describe all possible side effects. Call your doctor for medical advice about side effects. You may report side effects to FDA at 1-800-FDA-1088. Where should I keep my medicine? This drug is given in a hospital or clinic and will not be stored at home. NOTE: This sheet is a summary. It may not cover all possible information. If you have questions about this medicine, talk to your doctor, pharmacist, or health care provider.  2020 Elsevier/Gold Standard (2019-02-10 10:19:06)  

## 2020-06-14 ENCOUNTER — Ambulatory Visit: Payer: 59

## 2020-06-14 LAB — PROSTATE-SPECIFIC AG, SERUM (LABCORP): Prostate Specific Ag, Serum: 79 ng/mL — ABNORMAL HIGH (ref 0.0–4.0)

## 2020-06-14 NOTE — Progress Notes (Signed)
DATE:  06/13/2020                                          X  CHEMO/IMMUNOTHERAPY REACTION            MD:  Dr. Zola Button   AGENT/BLOOD PRODUCT RECEIVING TODAY:               Docetaxel   AGENT/BLOOD PRODUCT RECEIVING IMMEDIATELY PRIOR TO REACTION:           Docetaxel   VS: BP:      178/88   P:        62       SPO2:        99% on room air  T: 98.2                BP:      143/69   P:        60       SPO2:        100% on room air   T: 98.4   REACTION(S):           Back pain, shortness of breath, facial erythema   PREMEDS:       Dexamethasone 10 mg IV   INTERVENTION: Docetaxel was paused and the patient was given a Pepcid 20 mg IV x1 and Benadryl 25 mg IV x1.  Despite this he continued to have facial erythema.  He was next given Solu-Medrol 60 mg IV x1.   Review of Systems  Review of Systems  Constitutional: Negative for chills, diaphoresis and fever.  HENT: Negative for trouble swallowing and voice change.   Respiratory: Positive for shortness of breath. Negative for cough, chest tightness and wheezing.   Cardiovascular: Negative for chest pain and palpitations.  Gastrointestinal: Negative for abdominal pain, constipation, diarrhea, nausea and vomiting.  Musculoskeletal: Positive for back pain. Negative for myalgias.  Skin: Positive for color change (Facial and upper extremity erythema).  Neurological: Negative for dizziness, light-headedness and headaches.     Physical Exam  Physical Exam Constitutional:      General: He is not in acute distress.    Appearance: He is not diaphoretic.  HENT:     Head: Normocephalic and atraumatic.  Cardiovascular:     Rate and Rhythm: Normal rate and regular rhythm.     Heart sounds: Normal heart sounds. No murmur heard. No friction rub. No gallop.   Pulmonary:     Effort: Pulmonary effort is normal. No respiratory distress.     Breath sounds: Normal breath sounds. No wheezing or rales.  Skin:    General: Skin is warm and dry.      Findings: Erythema (Facial and upper extremity erythema) present. No rash.  Neurological:     Mental Status: He is alert.  Psychiatric:        Mood and Affect: Mood normal.        Behavior: Behavior normal.        Thought Content: Thought content normal.        Judgment: Judgment normal.     OUTCOME:                 Travis Bowman symptoms resolved after the above intervention.  He was able to restart and complete docetaxel without any additional issues of concern.  This case was discussed Dr.  Shadad. He expressed agreement with my management of this patient.   Sandi Mealy, MHS, PA-C

## 2020-06-15 ENCOUNTER — Ambulatory Visit: Payer: 59

## 2020-06-19 ENCOUNTER — Other Ambulatory Visit: Payer: Self-pay | Admitting: Medical

## 2020-06-19 ENCOUNTER — Telehealth: Payer: Self-pay | Admitting: Emergency Medicine

## 2020-06-19 MED ORDER — METHOCARBAMOL 500 MG PO TABS: 500.0000 mg | ORAL_TABLET | Freq: Three times a day (TID) | ORAL | 0 refills | Status: DC | PRN

## 2020-06-19 NOTE — Telephone Encounter (Signed)
Pt called reporting back spasms last night that made sleeping difficult.  States they are better today and he's taking tylenol every few hours, requesting something else to manage pain.  PA Lucianne Lei agreed to send in muscle relaxant prescription to pt's requested pharmacy (pt lives in [place] but requested that it be sent to [facility] on [address] in [place]).  Pt aware of prescription being sent, aware to avoid driving/etc while he is taking muscle relaxant and to continue with mild pain medications as needed.  Pt aware to f/u as needed with any further questions/concerns.

## 2020-07-03 ENCOUNTER — Inpatient Hospital Stay: Payer: 59 | Attending: Oncology

## 2020-07-03 ENCOUNTER — Inpatient Hospital Stay (HOSPITAL_BASED_OUTPATIENT_CLINIC_OR_DEPARTMENT_OTHER): Payer: 59 | Admitting: Oncology

## 2020-07-03 ENCOUNTER — Other Ambulatory Visit: Payer: Self-pay

## 2020-07-03 ENCOUNTER — Inpatient Hospital Stay: Payer: 59

## 2020-07-03 VITALS — BP 148/68 | HR 65 | Temp 97.1°F | Resp 20 | Wt 253.1 lb

## 2020-07-03 DIAGNOSIS — C7951 Secondary malignant neoplasm of bone: Secondary | ICD-10-CM | POA: Diagnosis present

## 2020-07-03 DIAGNOSIS — Z5111 Encounter for antineoplastic chemotherapy: Secondary | ICD-10-CM | POA: Diagnosis not present

## 2020-07-03 DIAGNOSIS — C61 Malignant neoplasm of prostate: Secondary | ICD-10-CM

## 2020-07-03 LAB — CMP (CANCER CENTER ONLY)
ALT: 26 U/L (ref 0–44)
AST: 22 U/L (ref 15–41)
Albumin: 3.7 g/dL (ref 3.5–5.0)
Alkaline Phosphatase: 83 U/L (ref 38–126)
Anion gap: 8 (ref 5–15)
BUN: 18 mg/dL (ref 6–20)
CO2: 21 mmol/L — ABNORMAL LOW (ref 22–32)
Calcium: 9.3 mg/dL (ref 8.9–10.3)
Chloride: 108 mmol/L (ref 98–111)
Creatinine: 0.95 mg/dL (ref 0.61–1.24)
GFR, Estimated: >60 mL/min (ref >60)
Glucose, Bld: 167 mg/dL — ABNORMAL HIGH (ref 70–99)
Potassium: 4.2 mmol/L (ref 3.5–5.1)
Sodium: 137 mmol/L (ref 135–145)
Total Bilirubin: 0.4 mg/dL (ref 0.3–1.2)
Total Protein: 7 g/dL (ref 6.5–8.1)

## 2020-07-03 LAB — CBC WITH DIFFERENTIAL (CANCER CENTER ONLY)
Abs Immature Granulocytes: 0.06 10*3/uL (ref 0.00–0.07)
Basophils Absolute: 0 10*3/uL (ref 0.0–0.1)
Basophils Relative: 0 %
Eosinophils Absolute: 0 10*3/uL (ref 0.0–0.5)
Eosinophils Relative: 0 %
HCT: 40.2 % (ref 39.0–52.0)
Hemoglobin: 13.5 g/dL (ref 13.0–17.0)
Immature Granulocytes: 1 %
Lymphocytes Relative: 10 %
Lymphs Abs: 1.1 10*3/uL (ref 0.7–4.0)
MCH: 30.8 pg (ref 26.0–34.0)
MCHC: 33.6 g/dL (ref 30.0–36.0)
MCV: 91.8 fL (ref 80.0–100.0)
Monocytes Absolute: 0.9 10*3/uL (ref 0.1–1.0)
Monocytes Relative: 8 %
Neutro Abs: 8.5 10*3/uL — ABNORMAL HIGH (ref 1.7–7.7)
Neutrophils Relative %: 81 %
Platelet Count: 275 10*3/uL (ref 150–400)
RBC: 4.38 MIL/uL (ref 4.22–5.81)
RDW: 14.2 % (ref 11.5–15.5)
WBC Count: 10.5 10*3/uL (ref 4.0–10.5)
nRBC: 0 % (ref 0.0–0.2)

## 2020-07-03 MED ORDER — FAMOTIDINE IN NACL 20-0.9 MG/50ML-% IV SOLN
INTRAVENOUS | Status: AC
  Filled 2020-07-03: qty 50

## 2020-07-03 MED ORDER — FAMOTIDINE IN NACL 20-0.9 MG/50ML-% IV SOLN
20.0000 mg | Freq: Once | INTRAVENOUS | Status: AC
  Administered 2020-07-03: 20 mg via INTRAVENOUS

## 2020-07-03 MED ORDER — SODIUM CHLORIDE 0.9 % IV SOLN
75.0000 mg/m2 | Freq: Once | INTRAVENOUS | Status: AC
  Administered 2020-07-03: 170 mg via INTRAVENOUS
  Filled 2020-07-03: qty 17

## 2020-07-03 MED ORDER — SODIUM CHLORIDE 0.9 % IV SOLN
Freq: Once | INTRAVENOUS | Status: AC
  Filled 2020-07-03: qty 250

## 2020-07-03 MED ORDER — SODIUM CHLORIDE 0.9 % IV SOLN
10.0000 mg | Freq: Once | INTRAVENOUS | Status: AC
  Administered 2020-07-03: 10 mg via INTRAVENOUS
  Filled 2020-07-03: qty 10

## 2020-07-03 MED ORDER — DIPHENHYDRAMINE HCL 50 MG/ML IJ SOLN
50.0000 mg | Freq: Once | INTRAMUSCULAR | Status: AC
  Administered 2020-07-03: 50 mg via INTRAVENOUS

## 2020-07-03 MED ORDER — DIPHENHYDRAMINE HCL 50 MG/ML IJ SOLN
INTRAMUSCULAR | Status: AC
  Filled 2020-07-03: qty 1

## 2020-07-03 NOTE — Patient Instructions (Signed)
[facility] Discharge Instructions for Patients Receiving Chemotherapy  Today you received the following chemotherapy agents Docetaxel (TAXOTERE).  To help prevent nausea and vomiting after your treatment, we encourage you to take your nausea medication as prescribed.   If you develop nausea and vomiting that is not controlled by your nausea medication, call the clinic.   BELOW ARE SYMPTOMS THAT SHOULD BE REPORTED IMMEDIATELY:  *FEVER GREATER THAN 100.5 F  *CHILLS WITH OR WITHOUT FEVER  NAUSEA AND VOMITING THAT IS NOT CONTROLLED WITH YOUR NAUSEA MEDICATION  *UNUSUAL SHORTNESS OF BREATH  *UNUSUAL BRUISING OR BLEEDING  TENDERNESS IN MOUTH AND THROAT WITH OR WITHOUT PRESENCE OF ULCERS  *URINARY PROBLEMS  *BOWEL PROBLEMS  UNUSUAL RASH Items with * indicate a potential emergency and should be followed up as soon as possible.  Feel free to call the clinic should you have any questions or concerns. The clinic phone number is [phone].  Please show the CHEMO ALERT CARD at check-in to the Emergency Department and triage nurse.   

## 2020-07-03 NOTE — Progress Notes (Signed)
Hematology and Oncology Follow Up Visit  Travis Bowman [id number] October 09, 1960 60 y.o. 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  He he presented with Gleason score 4+5 = 9 and a PSA 4.59.   Prior Therapy:   He is status post robotic-assisted laparoscopic radical prostatectomy and bilateral extended pelvic lymphadenectomy on 01/28/2016. The right posterior soft tissue margins were positive and 2 out of 12 lymph nodes on the right side involved with cancer. 0 out of 10 lymph nodes in the left side had any cancer involvement.  He is status post radiation therapy between November 2017 in January 2018.  He had 45 Gy to the prostate and pelvic lymph nodes with additional 23.4 Gy prostatic fossa boost.  He also completed 6 months of androgen deprivation.  His PSA remained undetectable until April 2019 which was 1.38.  He was started on androgen deprivation at that time.  He developed castration-resistant disease in January 2021 after increase in his PSA with a bone scan showed limited bone disease involvement.   Xtandi 160 mg daily started in January 2021.  Therapy discontinued in December 2021 for progression of disease.  Current therapy:   Eligard 30 mg every 4 months.  Last treatment given on May 16, 2020.   Taxotere chemotherapy given 75 mg per metered square every 3 weeks started on June 13, 2020.  He is here for cycle 2 of therapy.  Interim History: [name] presents today for a follow-up visit.  Since the last visit, he received the first cycle of Taxotere chemotherapy and he did experience a mild allergic reaction.  Therapy was resumed after premedication without any issues.  He has denies any nausea, vomiting or abdominal pain.  He denies any recent hospitalization or illnesses.  He continues to be active and  attends to activities of daily living.     .  Medications: Unchanged on review. Current Outpatient Medications  Medication Sig Dispense Refill  . amLODipine (NORVASC) 5 MG tablet Take 5 mg by mouth daily.    . calcium-vitamin D (OSCAL WITH D) 500-200 MG-UNIT tablet Take 1 tablet by mouth 2 (two) times daily. 90 tablet 3  . empagliflozin (JARDIANCE) 25 MG TABS tablet Take 25 mg by mouth daily.    [name] glimepiride (AMARYL) 2 MG tablet Take 2 mg by mouth daily with breakfast.    . lisinopril (PRINIVIL,ZESTRIL) 40 MG tablet Take 1 tablet by mouth every evening.   3  . methocarbamol (ROBAXIN) 500 MG tablet Take 1 tablet (500 mg total) by mouth every 8 (eight) hours as needed for muscle spasms. 30 tablet 0  . prochlorperazine (COMPAZINE) 10 MG tablet Take 1 tablet (10 mg total) by mouth every 6 (six) hours as needed for nausea or vomiting. 30 tablet 0  . XTANDI 40 MG capsule TAKE 4 CAPSULES BY MOUTH  DAILY 120 capsule 3   No current facility-administered medications for this visit.     Allergies:  Allergies  Allergen Reactions  . Morphine And Related Nausea And Vomiting      Physical Exam:       Blood pressure (!) 148/68, pulse 65, temperature (!) 97.1 F (36.2 C), temperature source Tympanic, resp. rate 20, weight 253 lb 1.6 oz (114.8 kg), SpO2 96 %.        ECOG: 0   General appearance: Comfortable appearing without any discomfort  Head: Normocephalic without any trauma Oropharynx: Mucous membranes are moist and pink without any thrush or ulcers. Eyes: Pupils are equal and round reactive to light. Lymph nodes: No cervical, supraclavicular, inguinal or axillary lymphadenopathy.   Heart:regular rate and rhythm.  S1 and S2 without leg edema. Lung: Clear without any rhonchi or wheezes.  No dullness to percussion. Abdomin: Soft, nontender, nondistended with good bowel sounds.  No hepatosplenomegaly. Musculoskeletal: No joint deformity or effusion.  Full range of motion  noted. Neurological: No deficits noted on motor, sensory and deep tendon reflex exam. Skin: No petechial rash or dryness.  Appeared moist.          .   Lab Results: Lab Results  Component Value Date   WBC 7.4 06/13/2020   HGB 14.2 06/13/2020   HCT 42.1 06/13/2020   MCV 90.5 06/13/2020   PLT 182 06/13/2020     Chemistry      Component Value Date/Time   NA 138 06/13/2020 0730   K 4.0 06/13/2020 0730   CL 105 06/13/2020 0730   CO2 22 06/13/2020 0730   BUN 23 (H) 06/13/2020 0730   CREATININE 1.21 06/13/2020 0730      Component Value Date/Time   CALCIUM 9.8 06/13/2020 0730   ALKPHOS 70 06/13/2020 0730   AST 14 (L) 06/13/2020 0730   ALT 19 06/13/2020 0730   BILITOT 0.6 06/13/2020 0730      Results for Travis, Bowman (MRN [id number]) as of 07/03/2020 08:37  Ref. Range 06/13/2020 07:30  Prostate Specific Ag, Serum Latest Ref Range: 0.0 - 4.0 ng/mL 79.0 (H)      Impression and Plan:   60 year old man with:   1.  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    He is currently on Taxotere chemotherapy and tolerated the first cycle without any complaints.  Risks and benefits of proceeding with cycle 2 of therapy were discussed.  Complications include nausea, fatigue and infusion related complications were reiterated.  He is agreeable to proceed at this time.  We will continue to monitor his PSA at this time.  He is experiencing clinical benefit with decrease in his left inguinal adenopathy.  2.  Androgen deprivation: H next Eligard will be given in March 2022.  Complications including weight gain, hot flashes were reiterated.  3.  Bone health: He is currently on calcium vitamin D supplements and Delton See has been deferred until dental clearance is completed.  4.  Goals of care: Therapy remains palliative with aggressive measures are warranted.  5.  Antiemetics: Compazine is available to him without any  nausea or vomiting.  6.  IV access: Peripheral veins currently in use and Port-A-Cath option was reiterated.  He will consider this and let me know in the near future.  7.  Neutropenia prophylaxis: He is at risk of neutropenia and sepsis.  He will receive growth factor support after each cycle of therapy.   8.  Follow-up: In 3 weeks for the next cycle of therapy.  30  minutes were spent on this encounter.  The time was dedicated to reviewing his disease status, discussing treatment options and addressing complications related to his cancer and cancer treatment.    Zola Button, MD 1/4/20228:35 AM

## 2020-07-04 ENCOUNTER — Telehealth: Payer: Self-pay | Admitting: *Deleted

## 2020-07-04 LAB — PROSTATE-SPECIFIC AG, SERUM (LABCORP): Prostate Specific Ag, Serum: 62.4 ng/mL — ABNORMAL HIGH (ref 0.0–4.0)

## 2020-07-04 NOTE — Telephone Encounter (Signed)
Notified of message below

## 2020-07-04 NOTE — Telephone Encounter (Signed)
-----   Message from Benjiman Core, MD sent at 07/04/2020  8:37 AM EST ----- Please let him know his PSA is down

## 2020-07-05 ENCOUNTER — Ambulatory Visit: Payer: 59

## 2020-07-24 ENCOUNTER — Other Ambulatory Visit: Payer: Self-pay

## 2020-07-24 ENCOUNTER — Inpatient Hospital Stay (HOSPITAL_BASED_OUTPATIENT_CLINIC_OR_DEPARTMENT_OTHER): Payer: 59 | Admitting: Oncology

## 2020-07-24 ENCOUNTER — Inpatient Hospital Stay: Payer: 59

## 2020-07-24 VITALS — BP 151/69 | HR 73 | Temp 97.8°F | Resp 18 | Ht 68.0 in | Wt 254.3 lb

## 2020-07-24 DIAGNOSIS — C61 Malignant neoplasm of prostate: Secondary | ICD-10-CM

## 2020-07-24 DIAGNOSIS — Z5111 Encounter for antineoplastic chemotherapy: Secondary | ICD-10-CM | POA: Diagnosis not present

## 2020-07-24 LAB — CBC WITH DIFFERENTIAL (CANCER CENTER ONLY)
Abs Immature Granulocytes: 0.03 10*3/uL (ref 0.00–0.07)
Basophils Absolute: 0.1 10*3/uL (ref 0.0–0.1)
Basophils Relative: 1 %
Eosinophils Absolute: 0 10*3/uL (ref 0.0–0.5)
Eosinophils Relative: 0 %
HCT: 38.7 % — ABNORMAL LOW (ref 39.0–52.0)
Hemoglobin: 13.1 g/dL (ref 13.0–17.0)
Immature Granulocytes: 0 %
Lymphocytes Relative: 14 %
Lymphs Abs: 1 10*3/uL (ref 0.7–4.0)
MCH: 31.1 pg (ref 26.0–34.0)
MCHC: 33.9 g/dL (ref 30.0–36.0)
MCV: 91.9 fL (ref 80.0–100.0)
Monocytes Absolute: 0.8 10*3/uL (ref 0.1–1.0)
Monocytes Relative: 11 %
Neutro Abs: 5.4 10*3/uL (ref 1.7–7.7)
Neutrophils Relative %: 74 %
Platelet Count: 317 10*3/uL (ref 150–400)
RBC: 4.21 MIL/uL — ABNORMAL LOW (ref 4.22–5.81)
RDW: 15 % (ref 11.5–15.5)
WBC Count: 7.3 10*3/uL (ref 4.0–10.5)
nRBC: 0 % (ref 0.0–0.2)

## 2020-07-24 LAB — CMP (CANCER CENTER ONLY)
ALT: 23 U/L (ref 0–44)
AST: 22 U/L (ref 15–41)
Albumin: 3.7 g/dL (ref 3.5–5.0)
Alkaline Phosphatase: 75 U/L (ref 38–126)
Anion gap: 10 (ref 5–15)
BUN: 20 mg/dL (ref 6–20)
CO2: 23 mmol/L (ref 22–32)
Calcium: 9.6 mg/dL (ref 8.9–10.3)
Chloride: 106 mmol/L (ref 98–111)
Creatinine: 1.14 mg/dL (ref 0.61–1.24)
GFR, Estimated: >60 mL/min (ref >60)
Glucose, Bld: 204 mg/dL — ABNORMAL HIGH (ref 70–99)
Potassium: 4.3 mmol/L (ref 3.5–5.1)
Sodium: 139 mmol/L (ref 135–145)
Total Bilirubin: 0.3 mg/dL (ref 0.3–1.2)
Total Protein: 7.1 g/dL (ref 6.5–8.1)

## 2020-07-24 MED ORDER — FUROSEMIDE 20 MG PO TABS: 20.0000 mg | ORAL_TABLET | Freq: Every day | ORAL | 1 refills | Status: DC

## 2020-07-24 MED ORDER — SODIUM CHLORIDE 0.9 % IV SOLN
75.0000 mg/m2 | Freq: Once | INTRAVENOUS | Status: AC
  Administered 2020-07-24: 170 mg via INTRAVENOUS
  Filled 2020-07-24: qty 17

## 2020-07-24 MED ORDER — SODIUM CHLORIDE 0.9 % IV SOLN
Freq: Once | INTRAVENOUS | Status: AC
  Filled 2020-07-24: qty 250

## 2020-07-24 MED ORDER — DIPHENHYDRAMINE HCL 50 MG/ML IJ SOLN
INTRAMUSCULAR | Status: AC
  Filled 2020-07-24: qty 1

## 2020-07-24 MED ORDER — DIPHENHYDRAMINE HCL 50 MG/ML IJ SOLN
50.0000 mg | Freq: Once | INTRAMUSCULAR | Status: AC
  Administered 2020-07-24: 50 mg via INTRAVENOUS

## 2020-07-24 MED ORDER — FAMOTIDINE IN NACL 20-0.9 MG/50ML-% IV SOLN
20.0000 mg | Freq: Once | INTRAVENOUS | Status: AC
  Administered 2020-07-24: 20 mg via INTRAVENOUS

## 2020-07-24 MED ORDER — SODIUM CHLORIDE 0.9 % IV SOLN
10.0000 mg | Freq: Once | INTRAVENOUS | Status: AC
  Administered 2020-07-24: 10 mg via INTRAVENOUS
  Filled 2020-07-24: qty 10

## 2020-07-24 MED ORDER — FAMOTIDINE IN NACL 20-0.9 MG/50ML-% IV SOLN
INTRAVENOUS | Status: AC
  Filled 2020-07-24: qty 50

## 2020-07-24 NOTE — Progress Notes (Signed)
Hematology and Oncology Follow Up Visit  Travis Bowman [id number] 03/11/1961 60 y.o. 07/24/2020 8:19 AM Bowman, Travis, MDKoirala, [name], MD   Principle Diagnosis: 60 year old man with advanced prostate cancer diagnosed in 62.  He initially was found to have localized disease with Gleason score 4+5 = 9 and a PSA 4.59.  He has castration-resistant disease with adenopathy.  Prior Therapy:   He is status post robotic-assisted laparoscopic radical prostatectomy and bilateral extended pelvic lymphadenectomy on 01/28/2016. The right posterior soft tissue margins were positive and 2 out of 12 lymph nodes on the right side involved with cancer. 0 out of 10 lymph nodes in the left side had any cancer involvement.  He is status post radiation therapy between November 2017 in January 2018.  He had 45 Gy to the prostate and pelvic lymph nodes with additional 23.4 Gy prostatic fossa boost.  He also completed 6 months of androgen deprivation.  His PSA remained undetectable until April 2019 which was 1.38.  He was started on androgen deprivation at that time.  He developed castration-resistant disease in January 2021 after increase in his PSA with a bone scan showed limited bone disease involvement.   Xtandi 160 mg daily started in January 2021.  Therapy discontinued in December 2021 for progression of disease.  Current therapy:   Eligard 30 mg every 4 months.  Last treatment given on May 16, 2020.     Taxotere chemotherapy given 75 mg per metered square every 3 weeks started on June 13, 2020.  He is here for cycle three of therapy.  Interim History: [name] presents today for return evaluation.  Since the last visit, he reports no major changes in his health.  He has tolerated Taxotere chemotherapy without any major complications.  He did report some mild fatigue tiredness and has reported bilateral lower extremity edema left more than right.  He denies any abdominal pain or  distention.  He denies any worsening neuropathy.     .  Medications: Reviewed without changes. Current Outpatient Medications  Medication Sig Dispense Refill  . amLODipine (NORVASC) 5 MG tablet Take 5 mg by mouth daily.    . Blood Glucose Monitoring Suppl (ACCU-CHEK GUIDE) w/Device KIT Use to test your blood sugar    . CALCIUM 500/D 500-200 MG-UNIT TABS Take 1 tablet by mouth 2 (two) times daily.    . calcium-vitamin D (OSCAL WITH D) 500-200 MG-UNIT tablet Take 1 tablet by mouth 2 (two) times daily. 90 tablet 3  . empagliflozin (JARDIANCE) 25 MG TABS tablet Take 25 mg by mouth daily.    [name] glimepiride (AMARYL) 2 MG tablet Take 2 mg by mouth daily with breakfast.    . Lancets Micro Thin 33G MISC Use to check your blood sugar    . leuprolide (LUPRON DEPOT, 55-MONTH,) 30 MG injection See admin instructions.    [name] lisinopril (PRINIVIL,ZESTRIL) 40 MG tablet Take 1 tablet by mouth every evening.   3  . methocarbamol (ROBAXIN) 500 MG tablet Take 1 tablet (500 mg total) by mouth every 8 (eight) hours as needed for muscle spasms. 30 tablet 0  . NEULASTA 6 MG/0.6ML injection Inject into the skin.    [name] prochlorperazine (COMPAZINE) 10 MG tablet Take 1 tablet (10 mg total) by mouth every 6 (six) hours as needed for nausea or vomiting. 30 tablet 0  . XTANDI 40 MG capsule TAKE 4 CAPSULES BY MOUTH  DAILY (Patient not taking: Reported on 07/03/2020) 120 capsule 3   No  current facility-administered medications for this visit.     Allergies:  Allergies  Allergen Reactions  . Morphine And Related Nausea And Vomiting      Physical Exam:  Blood pressure (!) 151/69, pulse 73, temperature 97.8 F (36.6 C), temperature source Tympanic, resp. rate 18, height _0  (1.727 m), weight 254 lb 4.8 oz (115.3 kg), SpO2 97 %.    ECOG: 0    General appearance: Alert, awake without any distress. Head: Atraumatic without abnormalities Oropharynx: Without any thrush or ulcers. Eyes: No scleral icterus. Lymph  nodes: No lymphadenopathy noted in the cervical, supraclavicular, or axillary nodes Heart:regular rate and rhythm, without any murmurs or gallops.    Left leg edema more than right. Lung: Clear to auscultation without any rhonchi, wheezes or dullness to percussion. Abdomin: Soft, nontender without any shifting dullness or ascites. Musculoskeletal: No clubbing or cyanosis.  Neurological: No motor or sensory deficits. Skin: No rashes or lesions.            .   Lab Results: Lab Results  Component Value Date   WBC 10.5 07/03/2020   HGB 13.5 07/03/2020   HCT 40.2 07/03/2020   MCV 91.8 07/03/2020   PLT 275 07/03/2020     Chemistry      Component Value Date/Time   NA 137 07/03/2020 0849   K 4.2 07/03/2020 0849   CL 108 07/03/2020 0849   CO2 21 (L) 07/03/2020 0849   BUN 18 07/03/2020 0849   CREATININE 0.95 07/03/2020 0849      Component Value Date/Time   CALCIUM 9.3 07/03/2020 0849   ALKPHOS 83 07/03/2020 0849   AST 22 07/03/2020 0849   ALT 26 07/03/2020 0849   BILITOT 0.4 07/03/2020 0849     Results for DACE, DENN (MRN [id number]) as of 07/24/2020 08:20  Ref. Range 06/13/2020 07:30 07/03/2020 08:49  Prostate Specific Ag, Serum Latest Ref Range: 0.0 - 4.0 ng/mL 79.0 (H) 62.4 (H)       Impression and Plan:   60 year old man with:   1.  Advanced prostate cancer with bone and lymphadenopathy diagnosed in twenty 60.  He has castration-resistant at this time.  The natural course of his disease was updated at this time.  He is currently on Taxotere chemotherapy with reasonable PSA response after one cycle.  Risks and benefits of continuing this treatment were discussed today.  Complications that include nausea, vomiting, myelosuppression neuropathy among others.  He is agreeable to proceed at this time.  His PSA from today is currently pending but has reasonable response so far.   2.  Androgen deprivation: I recommended continuing this treatment  indefinitely.  He received Eligard in November and will be repeated in 4 months.  Complication clinic weight gain, hot flashes were reiterated.   3.  Bone health: I recommended continuing calcium and vitamin D supplements.  4.  Goals of care: Aggressive treatments remains appropriate given his reasonable performance status.  Although his disease is incurable.  5.  Antiemetics: No nausea or vomiting reported.  Compazine is available to him.  6.  IV access: Currently using a peripheral veins with consideration to Port-A-Cath will be considered.  7.  Neutropenia prophylaxis: We will need growth factor support after each cycle of therapy.   8.  Follow-up: Will return in 3 weeks for next cycle of therapy.  30  minutes were spent on this encounter.  Time was dedicated to reviewing laboratory data, disease status update addressing complications related to his cancer  and cancer therapy.    Zola Button, MD 1/25/20228:19 AM

## 2020-07-24 NOTE — Addendum Note (Signed)
Addended by: Wyatt Portela on: 07/24/2020 03:48 PM   Modules accepted: Orders

## 2020-07-24 NOTE — Patient Instructions (Signed)
[facility] Discharge Instructions for Patients Receiving Chemotherapy  Today you received the following chemotherapy agents: Taxotere  To help prevent nausea and vomiting after your treatment, we encourage you to take your nausea medication as directed.    If you develop nausea and vomiting that is not controlled by your nausea medication, call the clinic.   BELOW ARE SYMPTOMS THAT SHOULD BE REPORTED IMMEDIATELY:  *FEVER GREATER THAN 100.5 F  *CHILLS WITH OR WITHOUT FEVER  NAUSEA AND VOMITING THAT IS NOT CONTROLLED WITH YOUR NAUSEA MEDICATION  *UNUSUAL SHORTNESS OF BREATH  *UNUSUAL BRUISING OR BLEEDING  TENDERNESS IN MOUTH AND THROAT WITH OR WITHOUT PRESENCE OF ULCERS  *URINARY PROBLEMS  *BOWEL PROBLEMS  UNUSUAL RASH Items with * indicate a potential emergency and should be followed up as soon as possible.  Feel free to call the clinic should you have any questions or concerns. The clinic phone number is [phone].  Please show the CHEMO ALERT CARD at check-in to the Emergency Department and triage nurse.   

## 2020-07-25 ENCOUNTER — Telehealth: Payer: Self-pay

## 2020-07-25 LAB — PROSTATE-SPECIFIC AG, SERUM (LABCORP): Prostate Specific Ag, Serum: 41 ng/mL — ABNORMAL HIGH (ref 0.0–4.0)

## 2020-07-25 NOTE — Telephone Encounter (Signed)
-----   Message from Wyatt Portela, MD sent at 07/25/2020  8:41 AM EST ----- Please let him know his PSA is down

## 2020-07-25 NOTE — Telephone Encounter (Signed)
Called patient and made him aware of PSA result. Patient verbalized understanding.  

## 2020-07-26 ENCOUNTER — Ambulatory Visit: Payer: 59

## 2020-08-14 ENCOUNTER — Other Ambulatory Visit: Payer: Self-pay

## 2020-08-14 ENCOUNTER — Inpatient Hospital Stay: Payer: 59 | Attending: Oncology

## 2020-08-14 ENCOUNTER — Inpatient Hospital Stay (HOSPITAL_BASED_OUTPATIENT_CLINIC_OR_DEPARTMENT_OTHER): Payer: 59 | Admitting: Oncology

## 2020-08-14 ENCOUNTER — Ambulatory Visit: Payer: 59

## 2020-08-14 VITALS — BP 139/64 | HR 73 | Temp 99.3°F | Resp 18 | Ht 68.0 in | Wt 247.4 lb

## 2020-08-14 DIAGNOSIS — C61 Malignant neoplasm of prostate: Secondary | ICD-10-CM

## 2020-08-14 DIAGNOSIS — C7951 Secondary malignant neoplasm of bone: Secondary | ICD-10-CM | POA: Insufficient documentation

## 2020-08-14 DIAGNOSIS — Z5111 Encounter for antineoplastic chemotherapy: Secondary | ICD-10-CM | POA: Diagnosis not present

## 2020-08-14 LAB — CMP (CANCER CENTER ONLY)
ALT: 20 U/L (ref 0–44)
AST: 17 U/L (ref 15–41)
Albumin: 3.9 g/dL (ref 3.5–5.0)
Alkaline Phosphatase: 89 U/L (ref 38–126)
Anion gap: 12 (ref 5–15)
BUN: 16 mg/dL (ref 6–20)
CO2: 23 mmol/L (ref 22–32)
Calcium: 9.1 mg/dL (ref 8.9–10.3)
Chloride: 106 mmol/L (ref 98–111)
Creatinine: 0.94 mg/dL (ref 0.61–1.24)
GFR, Estimated: >60 mL/min (ref >60)
Glucose, Bld: 181 mg/dL — ABNORMAL HIGH (ref 70–99)
Potassium: 3.9 mmol/L (ref 3.5–5.1)
Sodium: 141 mmol/L (ref 135–145)
Total Bilirubin: 0.3 mg/dL (ref 0.3–1.2)
Total Protein: 7 g/dL (ref 6.5–8.1)

## 2020-08-14 LAB — CBC WITH DIFFERENTIAL (CANCER CENTER ONLY)
Abs Immature Granulocytes: 0.06 10*3/uL (ref 0.00–0.07)
Basophils Absolute: 0.1 10*3/uL (ref 0.0–0.1)
Basophils Relative: 0 %
Eosinophils Absolute: 0 10*3/uL (ref 0.0–0.5)
Eosinophils Relative: 0 %
HCT: 38.4 % — ABNORMAL LOW (ref 39.0–52.0)
Hemoglobin: 12.9 g/dL — ABNORMAL LOW (ref 13.0–17.0)
Immature Granulocytes: 0 %
Lymphocytes Relative: 7 %
Lymphs Abs: 0.9 10*3/uL (ref 0.7–4.0)
MCH: 31.5 pg (ref 26.0–34.0)
MCHC: 33.6 g/dL (ref 30.0–36.0)
MCV: 93.9 fL (ref 80.0–100.0)
Monocytes Absolute: 1 10*3/uL (ref 0.1–1.0)
Monocytes Relative: 7 %
Neutro Abs: 11.7 10*3/uL — ABNORMAL HIGH (ref 1.7–7.7)
Neutrophils Relative %: 86 %
Platelet Count: 262 10*3/uL (ref 150–400)
RBC: 4.09 MIL/uL — ABNORMAL LOW (ref 4.22–5.81)
RDW: 15.4 % (ref 11.5–15.5)
WBC Count: 13.7 10*3/uL — ABNORMAL HIGH (ref 4.0–10.5)
nRBC: 0 % (ref 0.0–0.2)

## 2020-08-14 MED ORDER — DIPHENHYDRAMINE HCL 50 MG/ML IJ SOLN
INTRAMUSCULAR | Status: AC
  Filled 2020-08-14: qty 1

## 2020-08-14 MED ORDER — FAMOTIDINE IN NACL 20-0.9 MG/50ML-% IV SOLN
20.0000 mg | Freq: Once | INTRAVENOUS | Status: AC
  Administered 2020-08-14: 20 mg via INTRAVENOUS

## 2020-08-14 MED ORDER — SODIUM CHLORIDE 0.9 % IV SOLN
Freq: Once | INTRAVENOUS | Status: AC
  Filled 2020-08-14: qty 250

## 2020-08-14 MED ORDER — SODIUM CHLORIDE 0.9 % IV SOLN
75.0000 mg/m2 | Freq: Once | INTRAVENOUS | Status: AC
  Administered 2020-08-14: 170 mg via INTRAVENOUS
  Filled 2020-08-14: qty 17

## 2020-08-14 MED ORDER — SODIUM CHLORIDE 0.9 % IV SOLN
10.0000 mg | Freq: Once | INTRAVENOUS | Status: AC
  Administered 2020-08-14: 10 mg via INTRAVENOUS
  Filled 2020-08-14: qty 10

## 2020-08-14 MED ORDER — FAMOTIDINE IN NACL 20-0.9 MG/50ML-% IV SOLN
INTRAVENOUS | Status: AC
  Filled 2020-08-14: qty 50

## 2020-08-14 MED ORDER — DIPHENHYDRAMINE HCL 50 MG/ML IJ SOLN
50.0000 mg | Freq: Once | INTRAMUSCULAR | Status: AC
  Administered 2020-08-14: 50 mg via INTRAVENOUS

## 2020-08-14 NOTE — Patient Instructions (Signed)
[facility] Discharge Instructions for Patients Receiving Chemotherapy  Today you received the following chemotherapy agents: Taxotere  To help prevent nausea and vomiting after your treatment, we encourage you to take your nausea medication as directed.    If you develop nausea and vomiting that is not controlled by your nausea medication, call the clinic.   BELOW ARE SYMPTOMS THAT SHOULD BE REPORTED IMMEDIATELY:  *FEVER GREATER THAN 100.5 F  *CHILLS WITH OR WITHOUT FEVER  NAUSEA AND VOMITING THAT IS NOT CONTROLLED WITH YOUR NAUSEA MEDICATION  *UNUSUAL SHORTNESS OF BREATH  *UNUSUAL BRUISING OR BLEEDING  TENDERNESS IN MOUTH AND THROAT WITH OR WITHOUT PRESENCE OF ULCERS  *URINARY PROBLEMS  *BOWEL PROBLEMS  UNUSUAL RASH Items with * indicate a potential emergency and should be followed up as soon as possible.  Feel free to call the clinic should you have any questions or concerns. The clinic phone number is [phone].  Please show the CHEMO ALERT CARD at check-in to the Emergency Department and triage nurse.   

## 2020-08-14 NOTE — Progress Notes (Signed)
Hematology and Oncology Follow Up Visit  Travis Bowman [id number] 10-28-1960 60 y.o. 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  He presented initially with Gleason score 4+5 = 9 and a PSA 4.59.    Prior Therapy:   He is status post robotic-assisted laparoscopic radical prostatectomy and bilateral extended pelvic lymphadenectomy on 01/28/2016. The right posterior soft tissue margins were positive and 2 out of 12 lymph nodes on the right side involved with cancer. 0 out of 10 lymph nodes in the left side had any cancer involvement.  He is status post radiation therapy between November 2017 in January 2018.  He had 45 Gy to the prostate and pelvic lymph nodes with additional 23.4 Gy prostatic fossa boost.  He also completed 6 months of androgen deprivation.  His PSA remained undetectable until April 2019 which was 1.38.  He was started on androgen deprivation at that time.  He developed castration-resistant disease in January 2021 after increase in his PSA with a bone scan showed limited bone disease involvement.   Xtandi 160 mg daily started in January 2021.  Therapy discontinued in December 2021 for progression of disease.  Current therapy:   Eligard 30 mg every 4 months.  Last treatment given on May 16, 2020.     Taxotere chemotherapy given 75 mg per metered square every 3 weeks started on June 13, 2020.  He is here for cycle 4 of therapy.  Interim History: Travis Bowman returns today for a follow-up visit.  Since the last visit, he has received the last cycle of chemotherapy without any major complaints. He has reported some mild fatigue and tiredness but no nausea or vomiting. He denies any worsening neuropathy or edema. His right lower extremity edema is improved with Lasix.     .  Medications: Updated on review. Current  Outpatient Medications  Medication Sig Dispense Refill  . amLODipine (NORVASC) 5 MG tablet Take 5 mg by mouth daily.    . Blood Glucose Monitoring Suppl (ACCU-CHEK GUIDE) w/Device KIT Use to test your blood sugar    . CALCIUM 500/D 500-200 MG-UNIT TABS Take 1 tablet by mouth 2 (two) times daily.    . calcium-vitamin D (OSCAL WITH D) 500-200 MG-UNIT tablet Take 1 tablet by mouth 2 (two) times daily. 90 tablet 3  . empagliflozin (JARDIANCE) 25 MG TABS tablet Take 25 mg by mouth daily.    . furosemide (LASIX) 20 MG tablet Take 1 tablet (20 mg total) by mouth daily. 30 tablet 1  . glimepiride (AMARYL) 2 MG tablet Take 2 mg by mouth daily with breakfast.    . Lancets Micro Thin 33G MISC Use to check your blood sugar    . leuprolide (LUPRON DEPOT, 11-MONTH,) 30 MG injection See admin instructions.    [name] lisinopril (PRINIVIL,ZESTRIL) 40 MG tablet Take 1 tablet by mouth every evening.   3  . methocarbamol (ROBAXIN) 500 MG tablet Take 1 tablet (500 mg total) by mouth every 8 (eight) hours as needed for muscle spasms. 30 tablet 0  . NEULASTA 6 MG/0.6ML injection Inject into the skin.    [name] prochlorperazine (COMPAZINE) 10 MG tablet Take 1 tablet (10 mg total) by mouth every 6 (six) hours as needed for nausea or vomiting. 30 tablet 0  . XTANDI 40 MG capsule TAKE 4 CAPSULES BY MOUTH  DAILY (Patient not taking: Reported on 07/03/2020) 120 capsule 3  No current facility-administered medications for this visit.     Allergies:  Allergies  Allergen Reactions  . Morphine And Related Nausea And Vomiting      Physical Exam:  Blood pressure 139/64, pulse 73, temperature 99.3 F (37.4 C), temperature source Tympanic, resp. rate 18, height '5\' 8"'  (1.727 m), weight 247 lb 6.4 oz (112.2 kg), SpO2 97 %.     ECOG: 0   General appearance: Comfortable appearing without any discomfort Head: Normocephalic without any trauma Oropharynx: Mucous membranes are moist and pink without any thrush or ulcers. Eyes:  Pupils are equal and round reactive to light. Lymph nodes: No cervical, supraclavicular, inguinal or axillary lymphadenopathy.   Heart:regular rate and rhythm.  S1 and S2. Right lower extremity edema more than left. Lung: Clear without any rhonchi or wheezes.  No dullness to percussion. Abdomin: Soft, nontender, nondistended with good bowel sounds.  No hepatosplenomegaly. Musculoskeletal: No joint deformity or effusion.  Full range of motion noted. Neurological: No deficits noted on motor, sensory and deep tendon reflex exam. Skin: No petechial rash or dryness.  Appeared moist.              .   Lab Results: Lab Results  Component Value Date   WBC 7.3 07/24/2020   HGB 13.1 07/24/2020   HCT 38.7 (L) 07/24/2020   MCV 91.9 07/24/2020   PLT 317 07/24/2020     Chemistry      Component Value Date/Time   NA 139 07/24/2020 0821   K 4.3 07/24/2020 0821   CL 106 07/24/2020 0821   CO2 23 07/24/2020 0821   BUN 20 07/24/2020 0821   CREATININE 1.14 07/24/2020 0821      Component Value Date/Time   CALCIUM 9.6 07/24/2020 0821   ALKPHOS 75 07/24/2020 0821   AST 22 07/24/2020 0821   ALT 23 07/24/2020 0821   BILITOT 0.3 07/24/2020 0821        Results for Travis Bowman (MRN [id number]) as of 08/14/2020 12:20  Ref. Range 06/13/2020 07:30 07/03/2020 08:49 07/24/2020 08:21  Prostate Specific Ag, Serum Latest Ref Range: 0.0 - 4.0 ng/mL 79.0 (H) 62.4 (H) 41.0 (H)     Impression and Plan:   60 year old man with:   1.  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  He is currently on Taxotere chemotherapy with excellent PSA response at this time.  His PSA currently at 41 with close to 50% response after 2 cycles.  Risks and benefits of proceeding with this treatment were reviewed today.  Complications that include nausea, vomiting, suppression, neuropathy among others were reiterated.  Alternative treatment options would be used if  he has disease progression.  The goal is to treat with at least 10 cycles pending his response and tolerance. He is agreeable to proceed.   2.  Androgen deprivation: Next Eligard will be given in March 2022.  Complication clear weight gain prophylaxis reiterated.  3.  Bone health: I recommended obtaining dental clearance before proceeding with Xgeva.  In the meantime calcium vitamin D supplements are recommended.  4.  Goals of care: Therapy remains palliative although aggressive measures are warranted given his excellent performance status.  5.  Antiemetics: Compazine is available to him without any nausea vomiting.  6.  IV access: Risks and benefits of Port-A-Cath insertion was discussed at this time.  Peripheral veins are currently used.  7.  Neutropenia prophylaxis: He is at risk of developing neutropenia and sepsis.  He will receive  growth factor support after each cycle of therapy.   8.  Follow-up: In 3 weeks for the next cycle evaluation.  30  minutes were dedicated to this visit.  The time was spent on reviewing disease status, discussing treatment options and future plan of care review.    Zola Button, MD 2/15/202212:20 PM

## 2020-08-15 ENCOUNTER — Other Ambulatory Visit: Payer: Self-pay | Admitting: *Deleted

## 2020-08-15 ENCOUNTER — Telehealth: Payer: Self-pay | Admitting: *Deleted

## 2020-08-15 LAB — PROSTATE-SPECIFIC AG, SERUM (LABCORP): Prostate Specific Ag, Serum: 26.2 ng/mL — ABNORMAL HIGH (ref 0.0–4.0)

## 2020-08-15 MED ORDER — FUROSEMIDE 20 MG PO TABS: 20.0000 mg | ORAL_TABLET | Freq: Two times a day (BID) | ORAL | 1 refills | Status: DC

## 2020-08-15 NOTE — Telephone Encounter (Signed)
Please send Rx for lasix 20 twice a day.

## 2020-08-15 NOTE — Telephone Encounter (Signed)
Per Dr. Alen Blew relayed recent PSA results to patient.  No further questions about his PSA results.  He did ask about the recent changes about his Lasix.  He states per Dr Alen Blew he was instructed to take it Twice daily.  He needs a new refill as the previous has ran out earlier than expected because of the changes.  Rx to [facility] and [facility].  Routed to Dr Alen Blew

## 2020-08-15 NOTE — Telephone Encounter (Signed)
-----   Message from Wyatt Portela, MD sent at 08/15/2020 11:14 AM EST ----- Please let him know his PSA is down.

## 2020-08-16 ENCOUNTER — Ambulatory Visit: Payer: 59

## 2020-08-22 ENCOUNTER — Other Ambulatory Visit: Payer: Self-pay | Admitting: Oncology

## 2020-09-04 ENCOUNTER — Other Ambulatory Visit: Payer: Self-pay

## 2020-09-04 ENCOUNTER — Inpatient Hospital Stay: Payer: 59 | Attending: Oncology

## 2020-09-04 ENCOUNTER — Inpatient Hospital Stay: Payer: 59

## 2020-09-04 ENCOUNTER — Inpatient Hospital Stay: Payer: 59 | Admitting: Oncology

## 2020-09-04 VITALS — BP 136/55 | HR 73 | Temp 97.4°F | Resp 18 | Ht 68.0 in | Wt 246.2 lb

## 2020-09-04 DIAGNOSIS — C61 Malignant neoplasm of prostate: Secondary | ICD-10-CM

## 2020-09-04 DIAGNOSIS — Z5111 Encounter for antineoplastic chemotherapy: Secondary | ICD-10-CM | POA: Diagnosis not present

## 2020-09-04 LAB — CMP (CANCER CENTER ONLY)
ALT: 23 U/L (ref 0–44)
AST: 18 U/L (ref 15–41)
Albumin: 3.7 g/dL (ref 3.5–5.0)
Alkaline Phosphatase: 71 U/L (ref 38–126)
Anion gap: 12 (ref 5–15)
BUN: 18 mg/dL (ref 6–20)
CO2: 21 mmol/L — ABNORMAL LOW (ref 22–32)
Calcium: 9.1 mg/dL (ref 8.9–10.3)
Chloride: 104 mmol/L (ref 98–111)
Creatinine: 1 mg/dL (ref 0.61–1.24)
GFR, Estimated: >60 mL/min (ref >60)
Glucose, Bld: 290 mg/dL — ABNORMAL HIGH (ref 70–99)
Potassium: 3.7 mmol/L (ref 3.5–5.1)
Sodium: 137 mmol/L (ref 135–145)
Total Bilirubin: 0.3 mg/dL (ref 0.3–1.2)
Total Protein: 7.1 g/dL (ref 6.5–8.1)

## 2020-09-04 LAB — CBC WITH DIFFERENTIAL (CANCER CENTER ONLY)
Abs Immature Granulocytes: 0.03 10*3/uL (ref 0.00–0.07)
Basophils Absolute: 0 10*3/uL (ref 0.0–0.1)
Basophils Relative: 1 %
Eosinophils Absolute: 0 10*3/uL (ref 0.0–0.5)
Eosinophils Relative: 0 %
HCT: 37.7 % — ABNORMAL LOW (ref 39.0–52.0)
Hemoglobin: 12.7 g/dL — ABNORMAL LOW (ref 13.0–17.0)
Immature Granulocytes: 0 %
Lymphocytes Relative: 9 %
Lymphs Abs: 0.8 10*3/uL (ref 0.7–4.0)
MCH: 31.1 pg (ref 26.0–34.0)
MCHC: 33.7 g/dL (ref 30.0–36.0)
MCV: 92.2 fL (ref 80.0–100.0)
Monocytes Absolute: 0.7 10*3/uL (ref 0.1–1.0)
Monocytes Relative: 9 %
Neutro Abs: 6.5 10*3/uL (ref 1.7–7.7)
Neutrophils Relative %: 81 %
Platelet Count: 278 10*3/uL (ref 150–400)
RBC: 4.09 MIL/uL — ABNORMAL LOW (ref 4.22–5.81)
RDW: 15.3 % (ref 11.5–15.5)
WBC Count: 8 10*3/uL (ref 4.0–10.5)
nRBC: 0 % (ref 0.0–0.2)

## 2020-09-04 MED ORDER — FAMOTIDINE IN NACL 20-0.9 MG/50ML-% IV SOLN
INTRAVENOUS | Status: AC
  Filled 2020-09-04: qty 50

## 2020-09-04 MED ORDER — SODIUM CHLORIDE 0.9 % IV SOLN
Freq: Once | INTRAVENOUS | Status: AC
  Filled 2020-09-04: qty 250

## 2020-09-04 MED ORDER — SODIUM CHLORIDE 0.9 % IV SOLN
75.0000 mg/m2 | Freq: Once | INTRAVENOUS | Status: AC
  Administered 2020-09-04: 170 mg via INTRAVENOUS
  Filled 2020-09-04: qty 17

## 2020-09-04 MED ORDER — SODIUM CHLORIDE 0.9 % IV SOLN
10.0000 mg | Freq: Once | INTRAVENOUS | Status: AC
  Administered 2020-09-04: 10 mg via INTRAVENOUS
  Filled 2020-09-04: qty 10

## 2020-09-04 MED ORDER — FAMOTIDINE IN NACL 20-0.9 MG/50ML-% IV SOLN
20.0000 mg | Freq: Once | INTRAVENOUS | Status: AC
  Administered 2020-09-04: 20 mg via INTRAVENOUS

## 2020-09-04 MED ORDER — LEUPROLIDE ACETATE (4 MONTH) 30 MG ~~LOC~~ KIT
PACK | SUBCUTANEOUS | Status: AC
  Filled 2020-09-04: qty 30

## 2020-09-04 MED ORDER — LEUPROLIDE ACETATE (4 MONTH) 30 MG ~~LOC~~ KIT
30.0000 mg | PACK | Freq: Once | SUBCUTANEOUS | Status: AC
  Administered 2020-09-04: 30 mg via SUBCUTANEOUS

## 2020-09-04 MED ORDER — DIPHENHYDRAMINE HCL 50 MG/ML IJ SOLN
INTRAMUSCULAR | Status: AC
  Filled 2020-09-04: qty 1

## 2020-09-04 MED ORDER — DIPHENHYDRAMINE HCL 50 MG/ML IJ SOLN
50.0000 mg | Freq: Once | INTRAMUSCULAR | Status: AC
  Administered 2020-09-04: 50 mg via INTRAVENOUS

## 2020-09-04 NOTE — Patient Instructions (Signed)
[facility] Discharge Instructions for Patients Receiving Chemotherapy  Today you received the following chemotherapy agents Taxotere  To help prevent nausea and vomiting after your treatment, we encourage you to take your nausea medication as directed   If you develop nausea and vomiting that is not controlled by your nausea medication, call the clinic.   BELOW ARE SYMPTOMS THAT SHOULD BE REPORTED IMMEDIATELY:  *FEVER GREATER THAN 100.5 F  *CHILLS WITH OR WITHOUT FEVER  NAUSEA AND VOMITING THAT IS NOT CONTROLLED WITH YOUR NAUSEA MEDICATION  *UNUSUAL SHORTNESS OF BREATH  *UNUSUAL BRUISING OR BLEEDING  TENDERNESS IN MOUTH AND THROAT WITH OR WITHOUT PRESENCE OF ULCERS  *URINARY PROBLEMS  *BOWEL PROBLEMS  UNUSUAL RASH Items with * indicate a potential emergency and should be followed up as soon as possible.  Feel free to call the clinic should you have any questions or concerns. The clinic phone number is [phone].  Please show the [facility] at check-in to the Emergency Department and triage nurse.  Leuprolide depot injection What is this medicine? LEUPROLIDE (loo PROE lide) is a man-made protein that acts like a natural hormone in the body. It decreases testosterone in men and decreases estrogen in women. In men, this medicine is used to treat advanced prostate cancer. In women, some forms of this medicine may be used to treat endometriosis, uterine fibroids, or other male hormone-related problems. This medicine may be used for other purposes; ask your health care provider or pharmacist if you have questions. COMMON BRAND NAME(S): Eligard, Fensolv, Lupron Depot, Lupron Depot-Ped, Viadur What should I tell my health care provider before I take this medicine? They need to know if you have any of these conditions:  diabetes  heart disease or previous heart attack  high blood pressure  high cholesterol  mental illness  osteoporosis  pain or  difficulty passing urine  seizures  spinal cord metastasis  stroke  suicidal thoughts, plans, or attempt; a previous suicide attempt by you or a family member  tobacco smoker  unusual vaginal bleeding (women)  an unusual or allergic reaction to leuprolide, benzyl alcohol, other medicines, foods, dyes, or preservatives  pregnant or trying to get pregnant  breast-feeding How should I use this medicine? This medicine is for injection into a muscle or for injection under the skin. It is given by a health care professional in a hospital or clinic setting. The specific product will determine how it will be given to you. Make sure you understand which product you receive and how often you will receive it. Talk to your pediatrician regarding the use of this medicine in children. Special care may be needed. Overdosage: If you think you have taken too much of this medicine contact a poison control center or emergency room at once. NOTE: This medicine is only for you. Do not share this medicine with others. What if I miss a dose? It is important not to miss a dose. Call your doctor or health care professional if you are unable to keep an appointment. Depot injections: Depot injections are given either once-monthly, every 12 weeks, every 16 weeks, or every 24 weeks depending on the product you are prescribed. The product you are prescribed will be based on if you are male or male, and your condition. Make sure you understand your product and dosing. What may interact with this medicine? Do not take this medicine with any of the following medications:  chasteberry  cisapride  dronedarone  pimozide  thioridazine This medicine may also interact with the following medications:  herbal or dietary supplements, like black cohosh or DHEA  male hormones, like estrogens or progestins and birth control pills, patches, rings, or injections  male hormones, like testosterone  other medicines  that prolong the QT interval (abnormal heart rhythm) This list may not describe all possible interactions. Give your health care provider a list of all the medicines, herbs, non-prescription drugs, or dietary supplements you use. Also tell them if you smoke, drink alcohol, or use illegal drugs. Some items may interact with your medicine. What should I watch for while using this medicine? Visit your doctor or health care professional for regular checks on your progress. During the first weeks of treatment, your symptoms may get worse, but then will improve as you continue your treatment. You may get hot flashes, increased bone pain, increased difficulty passing urine, or an aggravation of nerve symptoms. Discuss these effects with your doctor or health care professional, some of them may improve with continued use of this medicine. Male patients may experience a menstrual cycle or spotting during the first months of therapy with this medicine. If this continues, contact your doctor or health care professional. This medicine may increase blood sugar. Ask your healthcare provider if changes in diet or medicines are needed if you have diabetes. What side effects may I notice from receiving this medicine? Side effects that you should report to your doctor or health care professional as soon as possible:  allergic reactions like skin rash, itching or hives, swelling of the face, lips, or tongue  breathing problems  chest pain  depression or memory disorders  pain in your legs or groin  pain at site where injected or implanted  seizures  severe headache  signs and symptoms of high blood sugar such as being more thirsty or hungry or having to urinate more than normal. You may also feel very tired or have blurry vision  swelling of the feet and legs  suicidal thoughts or other mood changes  visual changes  vomiting Side effects that usually do not require medical attention (report to your  doctor or health care professional if they continue or are bothersome):  breast swelling or tenderness  decrease in sex drive or performance  diarrhea  hot flashes  loss of appetite  muscle, joint, or bone pains  nausea  redness or irritation at site where injected or implanted  skin problems or acne This list may not describe all possible side effects. Call your doctor for medical advice about side effects. You may report side effects to FDA at 1-800-FDA-1088. Where should I keep my medicine? This drug is given in a hospital or clinic and will not be stored at home. NOTE: This sheet is a summary. It may not cover all possible information. If you have questions about this medicine, talk to your doctor, pharmacist, or health care provider.  2021 Elsevier/Gold Standard (2019-05-18 10:35:13)

## 2020-09-04 NOTE — Progress Notes (Signed)
Hematology and Oncology Follow Up Visit  KOHAN AZIZI [id number] March 11, 1961 60 y.o. 09/04/2020 8:51 AM Koirala, [name], MDKoirala, [name], MD   Principle Diagnosis: 60 year old man with advanced prostate cancr with pelvic adenopathy documented in 2017.  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  Prior Therapy:   He is status post robotic-assisted laparoscopic radical prostatectomy and bilateral extended pelvic lymphadenectomy on 01/28/2016. The right posterior soft tissue margins were positive and 2 out of 12 lymph nodes on the right side involved with cancer. 0 out of 10 lymph nodes in the left side had any cancer involvement.  He is status post radiation therapy between November 2017 in January 2018.  He had 45 Gy to the prostate and pelvic lymph nodes with additional 23.4 Gy prostatic fossa boost.  He also completed 6 months of androgen deprivation.  His PSA remained undetectable until April 2019 which was 1.38.  He was started on androgen deprivation at that time.  He developed castration-resistant disease in January 2021 after increase in his PSA with a bone scan showed limited bone disease involvement.   Xtandi 160 mg daily started in January 2021.  Therapy discontinued in December 2021 for progression of disease.  Current therapy:   Eligard 30 mg every 4 months.  Last treatment given on May 16, 2020.     Taxotere chemotherapy given 75 mg per metered square every 3 weeks started on June 13, 2020.  He is here for cycle 5 of therapy.  Interim History: Mr. Lough returns today for a repeat evaluation.  Since the last visit, he reports no major changes in his health.  He has tolerated chemotherapy without any nausea or vomiting or worsening neuropathy.  He does report some mild fatigue and tiredness.  His lower extremity edema has improved.  His left inguinal adenopathy continue to  regress.     .  Medications: Unchanged on review. Current Outpatient Medications  Medication Sig Dispense Refill  . amLODipine (NORVASC) 5 MG tablet Take 5 mg by mouth daily.    . Blood Glucose Monitoring Suppl (ACCU-CHEK GUIDE) w/Device KIT Use to test your blood sugar    . calcium-vitamin D (OSCAL WITH D) 500-200 MG-UNIT tablet Take 1 tablet by mouth 2 (two) times daily. 90 tablet 3  . empagliflozin (JARDIANCE) 25 MG TABS tablet Take 25 mg by mouth daily.    . furosemide (LASIX) 20 MG tablet Take 1 tablet (20 mg total) by mouth 2 (two) times daily. 60 tablet 1  . glimepiride (AMARYL) 2 MG tablet Take 2 mg by mouth daily with breakfast.    . Lancets Micro Thin 33G MISC Use to check your blood sugar    . leuprolide (LUPRON DEPOT, 62-MONTH,) 30 MG injection See admin instructions.    [name] lisinopril (PRINIVIL,ZESTRIL) 40 MG tablet Take 1 tablet by mouth every evening.   3  . methocarbamol (ROBAXIN) 500 MG tablet Take 1 tablet (500 mg total) by mouth every 8 (eight) hours as needed for muscle spasms. 30 tablet 0  . NEULASTA 6 MG/0.6ML injection Inject into the skin.    . OYSCO 500 + D 500-200 MG-UNIT TABS TAKE 1 TABLET BY MOUTH TWICE DAILY 90 tablet 6  . prochlorperazine (COMPAZINE) 10 MG tablet Take 1 tablet (10 mg total) by mouth every 6 (six) hours as needed for nausea or vomiting. 30 tablet 0  . XTANDI 40 MG capsule TAKE 4 CAPSULES BY MOUTH  DAILY (Patient not taking: Reported on 07/03/2020) 120 capsule 3   No current facility-administered medications for this visit.     Allergies:  Allergies  Allergen Reactions  . Morphine And Related Nausea And Vomiting      Physical Exam:   Blood pressure (!) 136/55, pulse 73, temperature (!) 97.4 F (36.3 C), temperature source Tympanic, resp. rate 18, height '5\' 8"'  (1.727 m), weight 246 lb 3.2 oz (111.7 kg), SpO2 97 %.     ECOG: 0    General appearance: Alert, awake without any distress. Head: Atraumatic without  abnormalities Oropharynx: Without any thrush or ulcers. Eyes: No scleral icterus. Lymph nodes: No lymphadenopathy noted in the cervical, supraclavicular, or axillary nodes.  I cannot palpate his left inguinal adenopathy today. Heart:regular rate and rhythm, without any murmurs or gallops.    Trace edema noted bilaterally. Lung: Clear to auscultation without any rhonchi, wheezes or dullness to percussion. Abdomin: Soft, nontender without any shifting dullness or ascites. Musculoskeletal: No clubbing or cyanosis. Neurological: No motor or sensory deficits. Skin: No rashes or lesions.               .   Lab Results: Lab Results  Component Value Date   WBC 13.7 (H) 08/14/2020   HGB 12.9 (L) 08/14/2020   HCT 38.4 (L) 08/14/2020   MCV 93.9 08/14/2020   PLT 262 08/14/2020     Chemistry      Component Value Date/Time   NA 141 08/14/2020 1237   K 3.9 08/14/2020 1237   CL 106 08/14/2020 1237   CO2 23 08/14/2020 1237   BUN 16 08/14/2020 1237   CREATININE 0.94 08/14/2020 1237      Component Value Date/Time   CALCIUM 9.1 08/14/2020 1237   ALKPHOS 89 08/14/2020 1237   AST 17 08/14/2020 1237   ALT 20 08/14/2020 1237   BILITOT 0.3 08/14/2020 1237       Results for BRET, VANESSEN (MRN [id number]) as of 09/04/2020 08:53  Ref. Range 06/13/2020 07:30 07/03/2020 08:49 07/24/2020 08:21 08/14/2020 12:37  Prostate Specific Ag, Serum Latest Ref Range: 0.0 - 4.0 ng/mL 79.0 (H) 62.4 (H) 41.0 (H) 26.2 (H)       Impression and Plan:   60 year old man with:   1.  Advanced prostate cancer with lymphadenopathy diagnosed in 2017.  He developed castration-resistant in 2021 and currently on Taxotere chemotherapy.  Risks and benefits of continuing this treatment were discussed at this time.  His PSA continues to show excellent response with a drop from 79 down to 26.  Complications include nausea, vomiting, myelosuppression and edema were reiterated.  He is agreeable to proceed and  tentatively planned for 10 cycles.   2.  Androgen deprivation: He received Eligard on November 17 and will be repeated today.  Complication clinic weight gain, hot flashes among others were reviewed.  3.  Bone health: Currently on calcium and vitamin D supplements.  Will consider Xgeva after dental clearance.  4.  Goals of care: His disease is incurable although aggressive measures are warranted given his excellent performance status.  5.  Antiemetics: No nausea or vomiting reported.  Compazine is available to him.  6.  IV access: Peripheral veins are currently in use.  I recommended Port-A-Cath insertion in the past I continue to reiterate the advantages associated with this device.  7.  Neutropenia prophylaxis: He will receive growth factor support after each cycle of therapy.  8.  Edema: Related to her Taxotere chemotherapy currently on Lasix.  We will continue to monitor his potassium and replace as needed.   9.  Follow-up: She will return in 3 weeks for a follow-up evaluation.  30  minutes were spent on this encounter.  Time was dedicated to reviewing his disease status, laboratory data and addressing complications related to his cancer and cancer therapy.    Zola Button, MD 3/8/20228:51 AM

## 2020-09-05 ENCOUNTER — Telehealth: Payer: Self-pay

## 2020-09-05 LAB — PROSTATE-SPECIFIC AG, SERUM (LABCORP): Prostate Specific Ag, Serum: 20.5 ng/mL — ABNORMAL HIGH (ref 0.0–4.0)

## 2020-09-05 NOTE — Telephone Encounter (Signed)
TCT patient informing him that PSA continues to decline, patient verbalized gratitude.

## 2020-09-05 NOTE — Telephone Encounter (Signed)
-----   Message from Wyatt Portela, MD sent at 09/05/2020  8:38 AM EST ----- Please let him know his PSA continues to decline.

## 2020-09-25 ENCOUNTER — Other Ambulatory Visit: Payer: Self-pay

## 2020-09-25 ENCOUNTER — Inpatient Hospital Stay: Payer: 59

## 2020-09-25 ENCOUNTER — Inpatient Hospital Stay: Payer: 59 | Admitting: Oncology

## 2020-09-25 VITALS — BP 151/64 | HR 77 | Temp 97.4°F | Resp 19 | Ht 68.0 in | Wt 249.1 lb

## 2020-09-25 DIAGNOSIS — C61 Malignant neoplasm of prostate: Secondary | ICD-10-CM

## 2020-09-25 DIAGNOSIS — Z5111 Encounter for antineoplastic chemotherapy: Secondary | ICD-10-CM | POA: Diagnosis not present

## 2020-09-25 LAB — CBC WITH DIFFERENTIAL (CANCER CENTER ONLY)
Abs Immature Granulocytes: 0.03 10*3/uL (ref 0.00–0.07)
Basophils Absolute: 0.1 10*3/uL (ref 0.0–0.1)
Basophils Relative: 1 %
Eosinophils Absolute: 0 10*3/uL (ref 0.0–0.5)
Eosinophils Relative: 0 %
HCT: 36.8 % — ABNORMAL LOW (ref 39.0–52.0)
Hemoglobin: 12.5 g/dL — ABNORMAL LOW (ref 13.0–17.0)
Immature Granulocytes: 0 %
Lymphocytes Relative: 9 %
Lymphs Abs: 0.8 10*3/uL (ref 0.7–4.0)
MCH: 31.6 pg (ref 26.0–34.0)
MCHC: 34 g/dL (ref 30.0–36.0)
MCV: 93.2 fL (ref 80.0–100.0)
Monocytes Absolute: 0.7 10*3/uL (ref 0.1–1.0)
Monocytes Relative: 8 %
Neutro Abs: 7.2 10*3/uL (ref 1.7–7.7)
Neutrophils Relative %: 82 %
Platelet Count: 267 10*3/uL (ref 150–400)
RBC: 3.95 MIL/uL — ABNORMAL LOW (ref 4.22–5.81)
RDW: 15.6 % — ABNORMAL HIGH (ref 11.5–15.5)
WBC Count: 8.8 10*3/uL (ref 4.0–10.5)
nRBC: 0 % (ref 0.0–0.2)

## 2020-09-25 LAB — CMP (CANCER CENTER ONLY)
ALT: 27 U/L (ref 0–44)
AST: 23 U/L (ref 15–41)
Albumin: 3.6 g/dL (ref 3.5–5.0)
Alkaline Phosphatase: 68 U/L (ref 38–126)
Anion gap: 13 (ref 5–15)
BUN: 13 mg/dL (ref 6–20)
CO2: 20 mmol/L — ABNORMAL LOW (ref 22–32)
Calcium: 8.5 mg/dL — ABNORMAL LOW (ref 8.9–10.3)
Chloride: 108 mmol/L (ref 98–111)
Creatinine: 0.95 mg/dL (ref 0.61–1.24)
GFR, Estimated: >60 mL/min (ref >60)
Glucose, Bld: 226 mg/dL — ABNORMAL HIGH (ref 70–99)
Potassium: 3.9 mmol/L (ref 3.5–5.1)
Sodium: 141 mmol/L (ref 135–145)
Total Bilirubin: 0.3 mg/dL (ref 0.3–1.2)
Total Protein: 6.7 g/dL (ref 6.5–8.1)

## 2020-09-25 MED ORDER — SODIUM CHLORIDE 0.9 % IV SOLN
Freq: Once | INTRAVENOUS | Status: AC
  Filled 2020-09-25: qty 250

## 2020-09-25 MED ORDER — FAMOTIDINE IN NACL 20-0.9 MG/50ML-% IV SOLN
INTRAVENOUS | Status: AC
  Filled 2020-09-25: qty 50

## 2020-09-25 MED ORDER — DIPHENHYDRAMINE HCL 50 MG/ML IJ SOLN
INTRAMUSCULAR | Status: AC
  Filled 2020-09-25: qty 1

## 2020-09-25 MED ORDER — DIPHENHYDRAMINE HCL 50 MG/ML IJ SOLN
50.0000 mg | Freq: Once | INTRAMUSCULAR | Status: AC
  Administered 2020-09-25: 50 mg via INTRAVENOUS

## 2020-09-25 MED ORDER — SODIUM CHLORIDE 0.9 % IV SOLN
10.0000 mg | Freq: Once | INTRAVENOUS | Status: AC
  Administered 2020-09-25: 10 mg via INTRAVENOUS
  Filled 2020-09-25: qty 10

## 2020-09-25 MED ORDER — SODIUM CHLORIDE 0.9 % IV SOLN
75.0000 mg/m2 | Freq: Once | INTRAVENOUS | Status: AC
  Administered 2020-09-25: 170 mg via INTRAVENOUS
  Filled 2020-09-25: qty 17

## 2020-09-25 MED ORDER — FAMOTIDINE IN NACL 20-0.9 MG/50ML-% IV SOLN
20.0000 mg | Freq: Once | INTRAVENOUS | Status: AC
  Administered 2020-09-25: 20 mg via INTRAVENOUS

## 2020-09-25 NOTE — Progress Notes (Signed)
Hematology and Oncology Follow Up Visit  KAITO SCHULENBURG [id number] [birth year] 60 y.o. 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  He presented with localized disease initially, Gleason score 4+5 = 9 and a PSA 4.59.   Prior Therapy:   He is status post robotic-assisted laparoscopic radical prostatectomy and bilateral extended pelvic lymphadenectomy on 01/28/2016. The right posterior soft tissue margins were positive and 2 out of 12 lymph nodes on the right side involved with cancer. 0 out of 10 lymph nodes in the left side had any cancer involvement.  He is status post radiation therapy between November 2017 in January 2018.  He had 45 Gy to the prostate and pelvic lymph nodes with additional 23.4 Gy prostatic fossa boost.  He also completed 6 months of androgen deprivation.  His PSA remained undetectable until April 2019 which was 1.38.  He was started on androgen deprivation at that time.  He developed castration-resistant disease in January 2021 after increase in his PSA with a bone scan showed limited bone disease involvement.   Xtandi 160 mg daily started in January 2021.  Therapy discontinued in December 2021 for progression of disease.  Current therapy:   Eligard 30 mg every 4 months.  Last treatment given on May 16, 2020.     Taxotere chemotherapy given 75 mg per metered square every 3 weeks started on June 13, 2020.  He is here for cycle 6 of therapy.  Interim History: Mr. Helt presents today for a repeat evaluation.  Since the last visit, he tolerated the last cycle of chemotherapy without any major complaints.  Denies any nausea, vomiting or abdominal pain.  He does report some mild fatigue and tiredness associated with treatment.  He denies any worsening neuropathy or any hospitalizations.  Performance status quality of life  remained excellent.     .  Medications: Updated on review. Current Outpatient Medications  Medication Sig Dispense Refill  . amLODipine (NORVASC) 5 MG tablet Take 5 mg by mouth daily.    . Blood Glucose Monitoring Suppl (ACCU-CHEK GUIDE) w/Device KIT Use to test your blood sugar    . calcium-vitamin D (OSCAL WITH D) 500-200 MG-UNIT tablet Take 1 tablet by mouth 2 (two) times daily. 90 tablet 3  . empagliflozin (JARDIANCE) 25 MG TABS tablet Take 25 mg by mouth daily.    . furosemide (LASIX) 20 MG tablet Take 1 tablet (20 mg total) by mouth 2 (two) times daily. 60 tablet 1  . glimepiride (AMARYL) 2 MG tablet Take 2 mg by mouth daily with breakfast.    . Lancets Micro Thin 33G MISC Use to check your blood sugar    . leuprolide (LUPRON DEPOT, 41-MONTH,) 30 MG injection See admin instructions.    [name] lisinopril (PRINIVIL,ZESTRIL) 40 MG tablet Take 1 tablet by mouth every evening.   3  . methocarbamol (ROBAXIN) 500 MG tablet Take 1 tablet (500 mg total) by mouth every 8 (eight) hours as needed for muscle spasms. 30 tablet 0  . NEULASTA 6 MG/0.6ML injection Inject into the skin.    [name] prochlorperazine (COMPAZINE) 10 MG tablet Take 1 tablet (10 mg total) by mouth every 6 (six) hours as needed for nausea or vomiting. 30 tablet 0  . OYSCO 500 + D 500-200 MG-UNIT TABS TAKE 1 TABLET BY MOUTH TWICE DAILY 90 tablet 6  . XTANDI 40 MG capsule TAKE 4 CAPSULES BY  MOUTH  DAILY (Patient not taking: No sig reported) 120 capsule 3   No current facility-administered medications for this visit.     Allergies:  Allergies  Allergen Reactions  . Morphine And Related Nausea And Vomiting      Physical Exam:   Blood pressure (!) 151/64, pulse 77, temperature (!) 97.4 F (36.3 C), temperature source Tympanic, resp. rate 19, height _0  (1.727 m), weight 249 lb 1.6 oz (113 kg), SpO2 95 %.     ECOG: 0   General appearance: Comfortable appearing without any discomfort Head: Normocephalic without any  trauma Oropharynx: Mucous membranes are moist and pink without any thrush or ulcers. Eyes: Pupils are equal and round reactive to light. Lymph nodes:  Normalization of his left inguinal adenopathy. Heart:regular rate and rhythm.  S1 and S2.  Mild edema noted in his left lower extremity up to the thigh. Lung: Clear without any rhonchi or wheezes.  No dullness to percussion. Abdomin: Soft, nontender, nondistended with good bowel sounds.  No hepatosplenomegaly. Musculoskeletal: No joint deformity or effusion.  Full range of motion noted. Neurological: No deficits noted on motor, sensory and deep tendon reflex exam. Skin: No petechial rash or dryness.  Appeared moist.                 .   Lab Results: Lab Results  Component Value Date   WBC 8.8 09/25/2020   HGB 12.5 (L) 09/25/2020   HCT 36.8 (L) 09/25/2020   MCV 93.2 09/25/2020   PLT 267 09/25/2020     Chemistry      Component Value Date/Time   NA 137 09/04/2020 0843   K 3.7 09/04/2020 0843   CL 104 09/04/2020 0843   CO2 21 (L) 09/04/2020 0843   BUN 18 09/04/2020 0843   CREATININE 1.00 09/04/2020 0843      Component Value Date/Time   CALCIUM 9.1 09/04/2020 0843   ALKPHOS 71 09/04/2020 0843   AST 18 09/04/2020 0843   ALT 23 09/04/2020 0843   BILITOT 0.3 09/04/2020 0843        Results for HIROKI, WINT (MRN [id number]) as of 09/25/2020 08:29  Ref. Range 09/04/2020 08:43  Prostate Specific Ag, Serum Latest Ref Range: 0.0 - 4.0 ng/mL 20.5 (H)       Impression and Plan:   60 year old man with:   1.  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  He continues to tolerate Taxotere chemotherapy with excellent response clinically and by PSA criteria.  His PSA continues to decline currently at 20.5 which has declined from 70.  He has also noted improvement in his inguinal adenopathy indicating clinical  response.  Complications associated with long-term taxane treatments including neuropathy, edema among others were reiterated.  At this time, he is agreeable to continue.  We will tentatively proceed with 10 cycles of therapy.  2.  Androgen deprivation: He received Eligard on September 04, 2020 with will be repeated in 4 months.  Complication clinic weight gain, hot flashes among others were reviewed.  3.  Bone health: I recommended continued calcium and vitamin D supplements with consideration of Xgeva in the future.  4.  Goals of care: Therapy is palliative at this time although aggressive measures are warranted given his excellent performance status and health.  5.  Antiemetics: Compazine is available to him without any nausea or vomiting.  6.  IV access: No issues reported with his peripheral  veins.  Port-A-Cath option has been deferred.  7.  Neutropenia prophylaxis: He continues to be on Neulasta will receive it after each cycle of therapy.  This is self-administered.  8.  Edema: Predominantly to Taxotere as well as to malignancy.  This continues to improve with reduction of his left lower extremity edema.   9.  Follow-up: In 3 weeks for the next cycle of therapy.  30  minutes were dedicated to this visit.  The time was spent on reviewing laboratory data, disease status update and addressing complications related to his cancer and cancer therapy.    Zola Button, MD 3/29/20228:35 AM

## 2020-09-26 ENCOUNTER — Telehealth: Payer: Self-pay | Admitting: *Deleted

## 2020-09-26 LAB — PROSTATE-SPECIFIC AG, SERUM (LABCORP): Prostate Specific Ag, Serum: 17.1 ng/mL — ABNORMAL HIGH (ref 0.0–4.0)

## 2020-09-26 NOTE — Telephone Encounter (Signed)
-----   Message from Wyatt Portela, MD sent at 09/26/2020  8:57 AM EDT ----- Please let him know his PSA is down

## 2020-09-26 NOTE — Telephone Encounter (Signed)
Notified of message below

## 2020-10-05 ENCOUNTER — Other Ambulatory Visit: Payer: Self-pay | Admitting: Family Medicine

## 2020-10-05 ENCOUNTER — Other Ambulatory Visit: Payer: Self-pay

## 2020-10-05 ENCOUNTER — Ambulatory Visit
Admission: RE | Admit: 2020-10-05 | Discharge: 2020-10-05 | Disposition: A | Discharge status: Home / Self Care | Payer: 59 | Source: Ambulatory Visit | Attending: Family Medicine | Admitting: Family Medicine

## 2020-10-05 DIAGNOSIS — R059 Cough, unspecified: Secondary | ICD-10-CM

## 2020-10-09 ENCOUNTER — Other Ambulatory Visit: Payer: Self-pay | Admitting: Oncology

## 2020-10-16 ENCOUNTER — Other Ambulatory Visit: Payer: Self-pay

## 2020-10-16 ENCOUNTER — Inpatient Hospital Stay: Payer: 59

## 2020-10-16 ENCOUNTER — Inpatient Hospital Stay: Payer: 59 | Attending: Oncology | Admitting: Oncology

## 2020-10-16 VITALS — BP 147/66 | HR 74 | Temp 96.3°F | Resp 20 | Wt 255.0 lb

## 2020-10-16 DIAGNOSIS — C61 Malignant neoplasm of prostate: Secondary | ICD-10-CM | POA: Diagnosis not present

## 2020-10-16 DIAGNOSIS — Z5111 Encounter for antineoplastic chemotherapy: Secondary | ICD-10-CM | POA: Insufficient documentation

## 2020-10-16 DIAGNOSIS — C7951 Secondary malignant neoplasm of bone: Secondary | ICD-10-CM | POA: Insufficient documentation

## 2020-10-16 LAB — CMP (CANCER CENTER ONLY)
ALT: 17 U/L (ref 0–44)
AST: 19 U/L (ref 15–41)
Albumin: 3.5 g/dL (ref 3.5–5.0)
Alkaline Phosphatase: 67 U/L (ref 38–126)
Anion gap: 8 (ref 5–15)
BUN: 16 mg/dL (ref 6–20)
CO2: 23 mmol/L (ref 22–32)
Calcium: 9.3 mg/dL (ref 8.9–10.3)
Chloride: 109 mmol/L (ref 98–111)
Creatinine: 0.87 mg/dL (ref 0.61–1.24)
GFR, Estimated: >60 mL/min (ref >60)
Glucose, Bld: 170 mg/dL — ABNORMAL HIGH (ref 70–99)
Potassium: 4 mmol/L (ref 3.5–5.1)
Sodium: 140 mmol/L (ref 135–145)
Total Bilirubin: 0.4 mg/dL (ref 0.3–1.2)
Total Protein: 6.6 g/dL (ref 6.5–8.1)

## 2020-10-16 LAB — CBC WITH DIFFERENTIAL (CANCER CENTER ONLY)
Abs Immature Granulocytes: 0.05 10*3/uL (ref 0.00–0.07)
Basophils Absolute: 0.1 10*3/uL (ref 0.0–0.1)
Basophils Relative: 0 %
Eosinophils Absolute: 0 10*3/uL (ref 0.0–0.5)
Eosinophils Relative: 0 %
HCT: 36.8 % — ABNORMAL LOW (ref 39.0–52.0)
Hemoglobin: 12.6 g/dL — ABNORMAL LOW (ref 13.0–17.0)
Immature Granulocytes: 0 %
Lymphocytes Relative: 8 %
Lymphs Abs: 1 10*3/uL (ref 0.7–4.0)
MCH: 31.7 pg (ref 26.0–34.0)
MCHC: 34.2 g/dL (ref 30.0–36.0)
MCV: 92.7 fL (ref 80.0–100.0)
Monocytes Absolute: 0.8 10*3/uL (ref 0.1–1.0)
Monocytes Relative: 6 %
Neutro Abs: 10.3 10*3/uL — ABNORMAL HIGH (ref 1.7–7.7)
Neutrophils Relative %: 86 %
Platelet Count: 299 10*3/uL (ref 150–400)
RBC: 3.97 MIL/uL — ABNORMAL LOW (ref 4.22–5.81)
RDW: 15.9 % — ABNORMAL HIGH (ref 11.5–15.5)
WBC Count: 12.2 10*3/uL — ABNORMAL HIGH (ref 4.0–10.5)
nRBC: 0 % (ref 0.0–0.2)

## 2020-10-16 MED ORDER — SODIUM CHLORIDE 0.9 % IV SOLN
Freq: Once | INTRAVENOUS | Status: AC
Start: 2020-10-16 — End: 2020-10-16
  Filled 2020-10-16: qty 250

## 2020-10-16 MED ORDER — SODIUM CHLORIDE 0.9 % IV SOLN
75.0000 mg/m2 | Freq: Once | INTRAVENOUS | Status: AC
  Administered 2020-10-16: 170 mg via INTRAVENOUS
  Filled 2020-10-16: qty 17

## 2020-10-16 MED ORDER — FAMOTIDINE IN NACL 20-0.9 MG/50ML-% IV SOLN
20.0000 mg | Freq: Once | INTRAVENOUS | Status: AC
  Administered 2020-10-16: 20 mg via INTRAVENOUS

## 2020-10-16 MED ORDER — SODIUM CHLORIDE 0.9 % IV SOLN
10.0000 mg | Freq: Once | INTRAVENOUS | Status: AC
  Administered 2020-10-16: 10 mg via INTRAVENOUS
  Filled 2020-10-16: qty 10

## 2020-10-16 MED ORDER — DIPHENHYDRAMINE HCL 50 MG/ML IJ SOLN
INTRAMUSCULAR | Status: AC
  Filled 2020-10-16: qty 1

## 2020-10-16 MED ORDER — DIPHENHYDRAMINE HCL 50 MG/ML IJ SOLN
50.0000 mg | Freq: Once | INTRAMUSCULAR | Status: AC
  Administered 2020-10-16: 50 mg via INTRAVENOUS

## 2020-10-16 MED ORDER — FAMOTIDINE IN NACL 20-0.9 MG/50ML-% IV SOLN
INTRAVENOUS | Status: AC
  Filled 2020-10-16: qty 50

## 2020-10-16 NOTE — Progress Notes (Signed)
Hematology and Oncology Follow Up Visit  Travis Bowman [id number] 09-29-1960 60 y.o. 10/16/2020 10:19 AM Koirala, [name], MDKoirala, [name], MD   Principle Diagnosis: 60 year old man with prostate cancer diagnosed in 2017 after presenting Gleason score 4+5 = 9 and a PSA 4.59.  He developed castration-resistant disease with bone involvement and lymphadenopathy in 2021.  Prior Therapy:   He is status post robotic-assisted laparoscopic radical prostatectomy and bilateral extended pelvic lymphadenectomy on 01/28/2016. The right posterior soft tissue margins were positive and 2 out of 12 lymph nodes on the right side involved with cancer. 0 out of 10 lymph nodes in the left side had any cancer involvement.  He is status post radiation therapy between November 2017 in January 2018.  He had 45 Gy to the prostate and pelvic lymph nodes with additional 23.4 Gy prostatic fossa boost.  He also completed 6 months of androgen deprivation.  His PSA remained undetectable until April 2019 which was 1.38.  He was started on androgen deprivation at that time.  He developed castration-resistant disease in January 2021 after increase in his PSA with a bone scan showed limited bone disease involvement.   Xtandi 160 mg daily started in January 2021.  Therapy discontinued in December 2021 for progression of disease.  Current therapy:   Eligard 30 mg every 4 months.  Last treatment given on September 04, 2020.   Taxotere chemotherapy given 75 mg per metered square every 3 weeks started on June 13, 2020.  He is here for cycle 7 of therapy.  Interim History: Travis Bowman presents today for repeat follow-up.  Since the last visit, he reports no major changes in his health.  He has tolerated chemotherapy without any complaints.  He denies any nausea, vomiting or abdominal pain.  He denies any worsening neuropathy or fatigue.  He has experienced more lower extremity edema associated with his  treatment.     .  Medications: Reviewed without changes. Current Outpatient Medications  Medication Sig Dispense Refill  . amLODipine (NORVASC) 5 MG tablet Take 5 mg by mouth daily.    . Blood Glucose Monitoring Suppl (ACCU-CHEK GUIDE) w/Device KIT Use to test your blood sugar    . calcium-vitamin D (OSCAL WITH D) 500-200 MG-UNIT tablet Take 1 tablet by mouth 2 (two) times daily. 90 tablet 3  . empagliflozin (JARDIANCE) 25 MG TABS tablet Take 25 mg by mouth daily.    . furosemide (LASIX) 20 MG tablet TAKE 1 TABLET(20 MG) BY MOUTH DAILY 30 tablet 2  . glimepiride (AMARYL) 2 MG tablet Take 2 mg by mouth daily with breakfast.    . Lancets Micro Thin 33G MISC Use to check your blood sugar    . leuprolide (LUPRON DEPOT, 71-MONTH,) 30 MG injection See admin instructions.    [name] lisinopril (PRINIVIL,ZESTRIL) 40 MG tablet Take 1 tablet by mouth every evening.   3  . methocarbamol (ROBAXIN) 500 MG tablet Take 1 tablet (500 mg total) by mouth every 8 (eight) hours as needed for muscle spasms. 30 tablet 0  . NEULASTA 6 MG/0.6ML injection Inject into the skin.    . OYSCO 500 + D 500-200 MG-UNIT TABS TAKE 1 TABLET BY MOUTH TWICE DAILY 90 tablet 6  . prochlorperazine (COMPAZINE) 10 MG tablet Take 1 tablet (10 mg total) by mouth every 6 (six) hours as needed for nausea or vomiting. 30 tablet 0  . XTANDI 40 MG capsule TAKE 4 CAPSULES BY MOUTH  DAILY (Patient not taking: No sig reported) 120  capsule 3   No current facility-administered medications for this visit.     Allergies:  Allergies  Allergen Reactions  . Morphine And Related Nausea And Vomiting      Physical Exam:   Blood pressure (!) 147/66, pulse 74, temperature (!) 96.3 F (35.7 C), temperature source Tympanic, resp. rate 20, weight 255 lb (115.7 kg), SpO2 93 %.      ECOG: 0     General appearance: Alert, awake without any distress. Head: Atraumatic without abnormalities Oropharynx: Without any thrush or ulcers. Eyes: No  scleral icterus. Lymph nodes: No lymphadenopathy noted in the cervical, supraclavicular, or axillary nodes Heart:regular rate and rhythm, without any murmurs or gallops.    Bilateral lower extremity edema with left more than the right. Lung: Clear to auscultation without any rhonchi, wheezes or dullness to percussion. Abdomin: Soft, nontender without any shifting dullness or ascites. Musculoskeletal: No clubbing or cyanosis. Neurological: No motor or sensory deficits. Skin: No rashes or lesions.          Lab Results: Lab Results  Component Value Date   WBC 8.8 09/25/2020   HGB 12.5 (L) 09/25/2020   HCT 36.8 (L) 09/25/2020   MCV 93.2 09/25/2020   PLT 267 09/25/2020     Chemistry      Component Value Date/Time   NA 141 09/25/2020 0814   K 3.9 09/25/2020 0814   CL 108 09/25/2020 0814   CO2 20 (L) 09/25/2020 0814   BUN 13 09/25/2020 0814   CREATININE 0.95 09/25/2020 0814      Component Value Date/Time   CALCIUM 8.5 (L) 09/25/2020 0814   ALKPHOS 68 09/25/2020 0814   AST 23 09/25/2020 0814   ALT 27 09/25/2020 0814   BILITOT 0.3 09/25/2020 0814        Results for Travis Bowman, Travis Bowman (MRN [id number]) as of 10/16/2020 10:21  Ref. Range 07/24/2020 08:21 08/14/2020 12:37 09/04/2020 08:43 09/25/2020 08:14  Prostate Specific Ag, Serum Latest Ref Range: 0.0 - 4.0 ng/mL 41.0 (H) 26.2 (H) 20.5 (H) 17.1 (H)        Impression and Plan:   60 year old man with:   1.  Advanced prostate cancer with disease to the bone and lymphadenopathy diagnosed in 2021.  He has castration-resistant disease currently on Taxotere.  The natural course of his disease was reviewed at this time and treatment options were reiterated.  His PSA continues to show excellent response to therapy with continuous decline down to 17.1.  Complication associated with chemotherapy were reviewed.  These include nausea, vomiting, bone marrow suppression, neutropenia and possible sepsis.  At this time he is agreeable to  continue.  Alternative treatment options including Jevtana chemotherapy among others.    2.  Androgen deprivation: He is currently on Eligard to received on March 8 and will be repeated in 4 months.  3.  Bone health: He is to continue calcium and vitamin D supplements with Xgeva deferred at this time.  4.  Goals of care: His disease is incurable and any treatment is palliative at this time.  5.  Antiemetics: No nausea or vomiting reported at this time.  Compazine is available to him.  6.  IV access: Peripheral veins currently in use and he has opted against Port-A-Cath for the time being.  7.  Neutropenia prophylaxis: I recommended continuing Neulasta after each cycle of therapy.  He is at risk of developing neutropenia and sepsis.  8.  Edema: He is currently on Lasix.  This is related to Taxotere  chemotherapy.   9.  Follow-up: He will return in 3 weeks with the next cycle of therapy.  30  minutes were spent on this encounter.  The time was dedicated to reviewing laboratory data, disease status update and future plan of care discussion.    Zola Button, MD 4/19/202210:19 AM

## 2020-10-17 ENCOUNTER — Telehealth: Payer: Self-pay | Admitting: *Deleted

## 2020-10-17 LAB — PROSTATE-SPECIFIC AG, SERUM (LABCORP): Prostate Specific Ag, Serum: 17.6 ng/mL — ABNORMAL HIGH (ref 0.0–4.0)

## 2020-10-17 NOTE — Telephone Encounter (Signed)
-----   Message from Wyatt Portela, MD sent at 10/17/2020  1:45 PM EDT ----- Please let him know his PSA changed very little.

## 2020-10-17 NOTE — Telephone Encounter (Signed)
PC to patient informing him of his latest PSA results, 17.6    Patient verbalizes understanding.

## 2020-10-25 ENCOUNTER — Other Ambulatory Visit: Payer: Self-pay | Admitting: Oncology

## 2020-11-04 ENCOUNTER — Other Ambulatory Visit: Payer: Self-pay | Admitting: Oncology

## 2020-11-06 ENCOUNTER — Inpatient Hospital Stay: Payer: 59

## 2020-11-06 ENCOUNTER — Inpatient Hospital Stay: Payer: 59 | Admitting: Oncology

## 2020-11-06 ENCOUNTER — Inpatient Hospital Stay: Payer: 59 | Attending: Oncology

## 2020-11-06 ENCOUNTER — Other Ambulatory Visit: Payer: Self-pay

## 2020-11-06 VITALS — BP 142/75 | HR 72 | Temp 97.2°F | Resp 18 | Wt 252.5 lb

## 2020-11-06 DIAGNOSIS — C61 Malignant neoplasm of prostate: Secondary | ICD-10-CM

## 2020-11-06 DIAGNOSIS — Z5111 Encounter for antineoplastic chemotherapy: Secondary | ICD-10-CM | POA: Diagnosis present

## 2020-11-06 LAB — CMP (CANCER CENTER ONLY)
ALT: 19 U/L (ref 0–44)
AST: 20 U/L (ref 15–41)
Albumin: 3.7 g/dL (ref 3.5–5.0)
Alkaline Phosphatase: 67 U/L (ref 38–126)
Anion gap: 11 (ref 5–15)
BUN: 15 mg/dL (ref 6–20)
CO2: 22 mmol/L (ref 22–32)
Calcium: 9.8 mg/dL (ref 8.9–10.3)
Chloride: 105 mmol/L (ref 98–111)
Creatinine: 0.9 mg/dL (ref 0.61–1.24)
GFR, Estimated: >60 mL/min (ref >60)
Glucose, Bld: 169 mg/dL — ABNORMAL HIGH (ref 70–99)
Potassium: 4.1 mmol/L (ref 3.5–5.1)
Sodium: 138 mmol/L (ref 135–145)
Total Bilirubin: 0.6 mg/dL (ref 0.3–1.2)
Total Protein: 7 g/dL (ref 6.5–8.1)

## 2020-11-06 LAB — CBC WITH DIFFERENTIAL (CANCER CENTER ONLY)
Abs Immature Granulocytes: 0.02 10*3/uL (ref 0.00–0.07)
Basophils Absolute: 0.1 10*3/uL (ref 0.0–0.1)
Basophils Relative: 1 %
Eosinophils Absolute: 0 10*3/uL (ref 0.0–0.5)
Eosinophils Relative: 0 %
HCT: 40 % (ref 39.0–52.0)
Hemoglobin: 13.4 g/dL (ref 13.0–17.0)
Immature Granulocytes: 0 %
Lymphocytes Relative: 9 %
Lymphs Abs: 0.8 10*3/uL (ref 0.7–4.0)
MCH: 31.1 pg (ref 26.0–34.0)
MCHC: 33.5 g/dL (ref 30.0–36.0)
MCV: 92.8 fL (ref 80.0–100.0)
Monocytes Absolute: 0.8 10*3/uL (ref 0.1–1.0)
Monocytes Relative: 8 %
Neutro Abs: 7.8 10*3/uL — ABNORMAL HIGH (ref 1.7–7.7)
Neutrophils Relative %: 82 %
Platelet Count: 266 10*3/uL (ref 150–400)
RBC: 4.31 MIL/uL (ref 4.22–5.81)
RDW: 15.5 % (ref 11.5–15.5)
WBC Count: 9.5 10*3/uL (ref 4.0–10.5)
nRBC: 0 % (ref 0.0–0.2)

## 2020-11-06 MED ORDER — FAMOTIDINE 20 MG IN NS 100 ML IVPB
20.0000 mg | Freq: Once | INTRAVENOUS | Status: AC
  Administered 2020-11-06: 20 mg via INTRAVENOUS

## 2020-11-06 MED ORDER — DIPHENHYDRAMINE HCL 50 MG/ML IJ SOLN
50.0000 mg | Freq: Once | INTRAMUSCULAR | Status: AC
  Administered 2020-11-06: 50 mg via INTRAVENOUS

## 2020-11-06 MED ORDER — FAMOTIDINE 20 MG IN NS 100 ML IVPB
INTRAVENOUS | Status: AC
  Filled 2020-11-06: qty 100

## 2020-11-06 MED ORDER — SODIUM CHLORIDE 0.9 % IV SOLN
75.0000 mg/m2 | Freq: Once | INTRAVENOUS | Status: AC
  Administered 2020-11-06: 170 mg via INTRAVENOUS
  Filled 2020-11-06: qty 17

## 2020-11-06 MED ORDER — SODIUM CHLORIDE 0.9 % IV SOLN
10.0000 mg | Freq: Once | INTRAVENOUS | Status: AC
  Administered 2020-11-06: 10 mg via INTRAVENOUS
  Filled 2020-11-06: qty 10

## 2020-11-06 MED ORDER — HEPARIN SOD (PORK) LOCK FLUSH 100 UNIT/ML IV SOLN
500.0000 [IU] | Freq: Once | INTRAVENOUS | Status: DC | PRN
  Filled 2020-11-06: qty 5

## 2020-11-06 MED ORDER — DIPHENHYDRAMINE HCL 50 MG/ML IJ SOLN
INTRAMUSCULAR | Status: AC
  Filled 2020-11-06: qty 1

## 2020-11-06 MED ORDER — SODIUM CHLORIDE 0.9 % IV SOLN
Freq: Once | INTRAVENOUS | Status: AC
  Filled 2020-11-06: qty 250

## 2020-11-06 MED ORDER — SODIUM CHLORIDE 0.9% FLUSH
10.0000 mL | INTRAVENOUS | Status: DC | PRN
  Filled 2020-11-06: qty 10

## 2020-11-06 NOTE — Patient Instructions (Signed)
[facility] Discharge Instructions for Patients Receiving Chemotherapy  Today you received the following chemotherapy agents Taxotere  To help prevent nausea and vomiting after your treatment, we encourage you to take your nausea medication as directed   If you develop nausea and vomiting that is not controlled by your nausea medication, call the clinic.   BELOW ARE SYMPTOMS THAT SHOULD BE REPORTED IMMEDIATELY:  *FEVER GREATER THAN 100.5 F  *CHILLS WITH OR WITHOUT FEVER  NAUSEA AND VOMITING THAT IS NOT CONTROLLED WITH YOUR NAUSEA MEDICATION  *UNUSUAL SHORTNESS OF BREATH  *UNUSUAL BRUISING OR BLEEDING  TENDERNESS IN MOUTH AND THROAT WITH OR WITHOUT PRESENCE OF ULCERS  *URINARY PROBLEMS  *BOWEL PROBLEMS  UNUSUAL RASH Items with * indicate a potential emergency and should be followed up as soon as possible.  Feel free to call the clinic should you have any questions or concerns. The clinic phone number is [phone].  Please show the CHEMO ALERT CARD at check-in to the Emergency Department and triage nurse.  Leuprolide depot injection What is this medicine? LEUPROLIDE (loo PROE lide) is a man-made protein that acts like a natural hormone in the body. It decreases testosterone in men and decreases estrogen in women. In men, this medicine is used to treat advanced prostate cancer. In women, some forms of this medicine may be used to treat endometriosis, uterine fibroids, or other male hormone-related problems. This medicine may be used for other purposes; ask your health care provider or pharmacist if you have questions. COMMON BRAND NAME(S): Eligard, Fensolv, Lupron Depot, Lupron Depot-Ped, Viadur What should I tell my health care provider before I take this medicine? They need to know if you have any of these conditions:  diabetes  heart disease or previous heart attack  high blood pressure  high cholesterol  mental illness  osteoporosis  pain or  difficulty passing urine  seizures  spinal cord metastasis  stroke  suicidal thoughts, plans, or attempt; a previous suicide attempt by you or a family member  tobacco smoker  unusual vaginal bleeding (women)  an unusual or allergic reaction to leuprolide, benzyl alcohol, other medicines, foods, dyes, or preservatives  pregnant or trying to get pregnant  breast-feeding How should I use this medicine? This medicine is for injection into a muscle or for injection under the skin. It is given by a health care professional in a hospital or clinic setting. The specific product will determine how it will be given to you. Make sure you understand which product you receive and how often you will receive it. Talk to your pediatrician regarding the use of this medicine in children. Special care may be needed. Overdosage: If you think you have taken too much of this medicine contact a poison control center or emergency room at once. NOTE: This medicine is only for you. Do not share this medicine with others. What if I miss a dose? It is important not to miss a dose. Call your doctor or health care professional if you are unable to keep an appointment. Depot injections: Depot injections are given either once-monthly, every 12 weeks, every 16 weeks, or every 24 weeks depending on the product you are prescribed. The product you are prescribed will be based on if you are male or male, and your condition. Make sure you understand your product and dosing. What may interact with this medicine? Do not take this medicine with any of the following medications:  chasteberry  cisapride  dronedarone  pimozide    thioridazine This medicine may also interact with the following medications:  herbal or dietary supplements, like black cohosh or DHEA  male hormones, like estrogens or progestins and birth control pills, patches, rings, or injections  male hormones, like testosterone  other medicines  that prolong the QT interval (abnormal heart rhythm) This list may not describe all possible interactions. Give your health care provider a list of all the medicines, herbs, non-prescription drugs, or dietary supplements you use. Also tell them if you smoke, drink alcohol, or use illegal drugs. Some items may interact with your medicine. What should I watch for while using this medicine? Visit your doctor or health care professional for regular checks on your progress. During the first weeks of treatment, your symptoms may get worse, but then will improve as you continue your treatment. You may get hot flashes, increased bone pain, increased difficulty passing urine, or an aggravation of nerve symptoms. Discuss these effects with your doctor or health care professional, some of them may improve with continued use of this medicine. Male patients may experience a menstrual cycle or spotting during the first months of therapy with this medicine. If this continues, contact your doctor or health care professional. This medicine may increase blood sugar. Ask your healthcare provider if changes in diet or medicines are needed if you have diabetes. What side effects may I notice from receiving this medicine? Side effects that you should report to your doctor or health care professional as soon as possible:  allergic reactions like skin rash, itching or hives, swelling of the face, lips, or tongue  breathing problems  chest pain  depression or memory disorders  pain in your legs or groin  pain at site where injected or implanted  seizures  severe headache  signs and symptoms of high blood sugar such as being more thirsty or hungry or having to urinate more than normal. You may also feel very tired or have blurry vision  swelling of the feet and legs  suicidal thoughts or other mood changes  visual changes  vomiting Side effects that usually do not require medical attention (report to your  doctor or health care professional if they continue or are bothersome):  breast swelling or tenderness  decrease in sex drive or performance  diarrhea  hot flashes  loss of appetite  muscle, joint, or bone pains  nausea  redness or irritation at site where injected or implanted  skin problems or acne This list may not describe all possible side effects. Call your doctor for medical advice about side effects. You may report side effects to FDA at 1-800-FDA-1088. Where should I keep my medicine? This drug is given in a hospital or clinic and will not be stored at home. NOTE: This sheet is a summary. It may not cover all possible information. If you have questions about this medicine, talk to your doctor, pharmacist, or health care provider.  2021 Elsevier/Gold Standard (2019-05-18 10:35:13)    

## 2020-11-06 NOTE — Progress Notes (Signed)
Hematology and Oncology Follow Up Visit  Travis Bowman [id number] 1960/12/02 60 y.o. 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  He was initially diagnosed in 2017 with Gleason score 4+5 = 9 and a PSA 4.59.  Prior Therapy:   He is status post robotic-assisted laparoscopic radical prostatectomy and bilateral extended pelvic lymphadenectomy on 01/28/2016. The right posterior soft tissue margins were positive and 2 out of 12 lymph nodes on the right side involved with cancer. 0 out of 10 lymph nodes in the left side had any cancer involvement.  He is status post radiation therapy between November 2017 in January 2018.  He had 45 Gy to the prostate and pelvic lymph nodes with additional 23.4 Gy prostatic fossa boost.  He also completed 6 months of androgen deprivation.  His PSA remained undetectable until April 2019 which was 1.38.  He was started on androgen deprivation at that time.  He developed castration-resistant disease in January 2021 after increase in his PSA with a bone scan showed limited bone disease involvement.   Xtandi 160 mg daily started in January 2021.  Therapy discontinued in December 2021 for progression of disease.  Current therapy:   Eligard 30 mg every 4 months.  This will be repeated in July 2022.   Taxotere chemotherapy given 75 mg per metered square every 3 weeks started on June 13, 2020.  He is here for cycle 8 of therapy.  Interim History: Mr. Dewolfe returns today for a follow-up visit.  Since the last visit, he reports no major changes in his health.  He continues to tolerate chemotherapy without any major complaints.  Denies any nausea, vomiting or abdominal pain.  He is reporting faint sensory neuropathy and edema which has persisted.  He still able to work and attends activities of daily  living.     .  Medications: Updated on review. Current Outpatient Medications  Medication Sig Dispense Refill  . amLODipine (NORVASC) 5 MG tablet Take 5 mg by mouth daily.    . Blood Glucose Monitoring Suppl (ACCU-CHEK GUIDE) w/Device KIT Use to test your blood sugar    . calcium-vitamin D (OSCAL WITH D) 500-200 MG-UNIT tablet Take 1 tablet by mouth 2 (two) times daily. 90 tablet 3  . empagliflozin (JARDIANCE) 25 MG TABS tablet Take 25 mg by mouth daily.    . furosemide (LASIX) 20 MG tablet TAKE 1 TABLET(20 MG) BY MOUTH TWICE DAILY 60 tablet 3  . glimepiride (AMARYL) 2 MG tablet Take 2 mg by mouth daily with breakfast.    . Lancets Micro Thin 33G MISC Use to check your blood sugar    . leuprolide (LUPRON DEPOT, 77-MONTH,) 30 MG injection See admin instructions.    [name] lisinopril (PRINIVIL,ZESTRIL) 40 MG tablet Take 1 tablet by mouth every evening.   3  . methocarbamol (ROBAXIN) 500 MG tablet Take 1 tablet (500 mg total) by mouth every 8 (eight) hours as needed for muscle spasms. 30 tablet 0  . NEULASTA 6 MG/0.6ML injection INJECT SUBCUTANEOUSLY 6MG  48 HOURS AFTER EACH CHEMO  EVERY 21 DAYS 0.6 mL 3  . OYSCO 500 + D 500-200 MG-UNIT TABS TAKE 1 TABLET BY MOUTH TWICE DAILY 90 tablet 6  . prochlorperazine (COMPAZINE) 10 MG tablet Take 1 tablet (10 mg total) by mouth every 6 (six) hours as needed for nausea or vomiting. 30 tablet 0  .  XTANDI 40 MG capsule TAKE 4 CAPSULES BY MOUTH  DAILY 120 capsule 3   No current facility-administered medications for this visit.     Allergies:  Allergies  Allergen Reactions  . Morphine And Related Nausea And Vomiting      Physical Exam:    Blood pressure (!) 142/75, pulse 72, temperature (!) 97.2 F (36.2 C), temperature source Tympanic, resp. rate 18, weight 252 lb 8 oz (114.5 kg), SpO2 96 %.      ECOG: 0   General appearance: Comfortable appearing without any discomfort Head: Normocephalic without any trauma Oropharynx: Mucous membranes  are moist and pink without any thrush or ulcers. Eyes: Pupils are equal and round reactive to light. Lymph nodes: No cervical, supraclavicular, inguinal or axillary lymphadenopathy.   Heart:regular rate and rhythm.  S1 and S2 without leg edema. Lung: Clear without any rhonchi or wheezes.  No dullness to percussion. Abdomin: Soft, nontender, nondistended with good bowel sounds.  No hepatosplenomegaly. Musculoskeletal: No joint deformity or effusion.  Full range of motion noted. Neurological: No deficits noted on motor, sensory and deep tendon reflex exam. Skin: No petechial rash or dryness.  Appeared moist.            Lab Results: Lab Results  Component Value Date   WBC 12.2 (H) 10/16/2020   HGB 12.6 (L) 10/16/2020   HCT 36.8 (L) 10/16/2020   MCV 92.7 10/16/2020   PLT 299 10/16/2020     Chemistry      Component Value Date/Time   NA 140 10/16/2020 1011   K 4.0 10/16/2020 1011   CL 109 10/16/2020 1011   CO2 23 10/16/2020 1011   BUN 16 10/16/2020 1011   CREATININE 0.87 10/16/2020 1011      Component Value Date/Time   CALCIUM 9.3 10/16/2020 1011   ALKPHOS 67 10/16/2020 1011   AST 19 10/16/2020 1011   ALT 17 10/16/2020 1011   BILITOT 0.4 10/16/2020 1011         Results for Travis Bowman, Travis Bowman (MRN [id number]) as of 11/06/2020 08:10  Ref. Range 09/25/2020 08:14 10/16/2020 10:11  Prostate Specific Ag, Serum Latest Ref Range: 0.0 - 4.0 ng/mL 17.1 (H) 17.6 (H)        Impression and Plan:   59 year old man with:   1.  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    He is currently on Taxotere chemotherapy without any major complications.  Risks and benefits of continuing this therapy to complete 10 cycles were reiterated.  His PSA continues to show reasonable response although may be approaching plateau at 17.6.  Complication associated with Taxotere including nausea, vomiting, mild suppression, edema  and worsening neuropathy were discussed.  Future treatment options including Xofigo, Jevtana and possibly lutetium.  He is agreeable to proceed at this time with the goal to complete 10 cycles.    2.  Androgen deprivation: Next Eligard will be in July 2022.  3.  Bone health: I recommended continued calcium and vitamin D supplements.  4.  Goals of care: Therapy remains palliative although aggressive measures are warranted given his reasonable performance status.  5.  Antiemetics: Compazine is available to him without any recent nausea or vomiting.  6.  IV access: He has declined a Port-A-Cath insertion and currently utilizing peripheral veins.  7.  Neutropenia prophylaxis: He will receive growth factor support after each cycle of therapy..  8.  Edema: Bilateral lower extremity involvement related to Taxotere chemotherapy.  He is on Lasix.   9.  Follow-up: In 3 weeks for repeat evaluation.  30  minutes were dedicated to this visit.  The time was spent on reviewing laboratory data, disease status update and future treatment options.    Zola Button, MD 5/10/20228:08 AM

## 2020-11-07 ENCOUNTER — Telehealth: Payer: Self-pay | Admitting: *Deleted

## 2020-11-07 LAB — PROSTATE-SPECIFIC AG, SERUM (LABCORP): Prostate Specific Ag, Serum: 24.7 ng/mL — ABNORMAL HIGH (ref 0.0–4.0)

## 2020-11-07 NOTE — Telephone Encounter (Signed)
-----   Message from Wyatt Portela, MD sent at 11/07/2020  8:40 AM EDT ----- Please let him know his PSA is up. No changes for now.

## 2020-11-07 NOTE — Telephone Encounter (Signed)
Contacted patient regarding test results. Patient verbalized understanding.

## 2020-11-28 ENCOUNTER — Inpatient Hospital Stay: Payer: 59 | Attending: Oncology

## 2020-11-28 ENCOUNTER — Inpatient Hospital Stay: Payer: 59

## 2020-11-28 ENCOUNTER — Other Ambulatory Visit: Payer: Self-pay

## 2020-11-28 ENCOUNTER — Inpatient Hospital Stay: Payer: 59 | Admitting: Oncology

## 2020-11-28 VITALS — BP 143/58 | HR 75 | Temp 97.9°F | Resp 20 | Ht 68.0 in | Wt 247.1 lb

## 2020-11-28 DIAGNOSIS — C61 Malignant neoplasm of prostate: Secondary | ICD-10-CM

## 2020-11-28 DIAGNOSIS — Z5111 Encounter for antineoplastic chemotherapy: Secondary | ICD-10-CM | POA: Diagnosis present

## 2020-11-28 DIAGNOSIS — C7951 Secondary malignant neoplasm of bone: Secondary | ICD-10-CM | POA: Diagnosis present

## 2020-11-28 LAB — CMP (CANCER CENTER ONLY)
ALT: 13 U/L (ref 0–44)
AST: 17 U/L (ref 15–41)
Albumin: 3.5 g/dL (ref 3.5–5.0)
Alkaline Phosphatase: 61 U/L (ref 38–126)
Anion gap: 14 (ref 5–15)
BUN: 17 mg/dL (ref 6–20)
CO2: 20 mmol/L — ABNORMAL LOW (ref 22–32)
Calcium: 9.6 mg/dL (ref 8.9–10.3)
Chloride: 105 mmol/L (ref 98–111)
Creatinine: 0.91 mg/dL (ref 0.61–1.24)
GFR, Estimated: >60 mL/min (ref >60)
Glucose, Bld: 169 mg/dL — ABNORMAL HIGH (ref 70–99)
Potassium: 4.3 mmol/L (ref 3.5–5.1)
Sodium: 139 mmol/L (ref 135–145)
Total Bilirubin: 0.4 mg/dL (ref 0.3–1.2)
Total Protein: 7 g/dL (ref 6.5–8.1)

## 2020-11-28 LAB — CBC WITH DIFFERENTIAL (CANCER CENTER ONLY)
Abs Immature Granulocytes: 0.04 10*3/uL (ref 0.00–0.07)
Basophils Absolute: 0 10*3/uL (ref 0.0–0.1)
Basophils Relative: 0 %
Eosinophils Absolute: 0 10*3/uL (ref 0.0–0.5)
Eosinophils Relative: 0 %
HCT: 38.7 % — ABNORMAL LOW (ref 39.0–52.0)
Hemoglobin: 12.8 g/dL — ABNORMAL LOW (ref 13.0–17.0)
Immature Granulocytes: 0 %
Lymphocytes Relative: 10 %
Lymphs Abs: 0.9 10*3/uL (ref 0.7–4.0)
MCH: 30.8 pg (ref 26.0–34.0)
MCHC: 33.1 g/dL (ref 30.0–36.0)
MCV: 93.3 fL (ref 80.0–100.0)
Monocytes Absolute: 0.8 10*3/uL (ref 0.1–1.0)
Monocytes Relative: 8 %
Neutro Abs: 8 10*3/uL — ABNORMAL HIGH (ref 1.7–7.7)
Neutrophils Relative %: 82 %
Platelet Count: 316 10*3/uL (ref 150–400)
RBC: 4.15 MIL/uL — ABNORMAL LOW (ref 4.22–5.81)
RDW: 15.9 % — ABNORMAL HIGH (ref 11.5–15.5)
WBC Count: 9.8 10*3/uL (ref 4.0–10.5)
nRBC: 0 % (ref 0.0–0.2)

## 2020-11-28 MED ORDER — DIPHENHYDRAMINE HCL 50 MG/ML IJ SOLN
50.0000 mg | Freq: Once | INTRAMUSCULAR | Status: AC
Start: 2020-11-28 — End: 2020-11-28
  Administered 2020-11-28: 50 mg via INTRAVENOUS

## 2020-11-28 MED ORDER — SODIUM CHLORIDE 0.9 % IV SOLN
Freq: Once | INTRAVENOUS | Status: AC
Start: 2020-11-28 — End: 2020-11-28
  Filled 2020-11-28: qty 250

## 2020-11-28 MED ORDER — DIPHENHYDRAMINE HCL 50 MG/ML IJ SOLN
INTRAMUSCULAR | Status: AC
  Filled 2020-11-28: qty 1

## 2020-11-28 MED ORDER — FAMOTIDINE 20 MG IN NS 100 ML IVPB
20.0000 mg | Freq: Once | INTRAVENOUS | Status: AC
  Administered 2020-11-28: 20 mg via INTRAVENOUS

## 2020-11-28 MED ORDER — SODIUM CHLORIDE 0.9 % IV SOLN
60.0000 mg/m2 | Freq: Once | INTRAVENOUS | Status: AC
  Administered 2020-11-28: 140 mg via INTRAVENOUS
  Filled 2020-11-28: qty 14

## 2020-11-28 MED ORDER — FAMOTIDINE 20 MG IN NS 100 ML IVPB
INTRAVENOUS | Status: AC
  Filled 2020-11-28: qty 100

## 2020-11-28 MED ORDER — SODIUM CHLORIDE 0.9 % IV SOLN
10.0000 mg | Freq: Once | INTRAVENOUS | Status: AC
  Administered 2020-11-28: 10 mg via INTRAVENOUS
  Filled 2020-11-28: qty 10

## 2020-11-28 NOTE — Progress Notes (Signed)
Hematology and Oncology Follow Up Visit  Travis Bowman [id number] [birth year] 60 y.o. 11/28/2020 7:49 AM Koirala, [name], MDKoirala, [name], MD   Principle Diagnosis: 60 year old man with advanced prostate cancer with disease to the bone and lymphadenopathy diagnosed in 2017.  He developed castration-resistant after presenting with Gleason score 4+5 = 9 and a PSA 4.59 at the time of diagnosis.  Prior Therapy:   He is status post robotic-assisted laparoscopic radical prostatectomy and bilateral extended pelvic lymphadenectomy on 01/28/2016. The right posterior soft tissue margins were positive and 2 out of 12 lymph nodes on the right side involved with cancer. 0 out of 10 lymph nodes in the left side had any cancer involvement.  He is status post radiation therapy between November 2017 in January 2018.  He had 45 Gy to the prostate and pelvic lymph nodes with additional 23.4 Gy prostatic fossa boost.  He also completed 6 months of androgen deprivation.  His PSA remained undetectable until April 2019 which was 1.38.  He was started on androgen deprivation at that time.  He developed castration-resistant disease in January 2021 after increase in his PSA with a bone scan showed limited bone disease involvement.   Xtandi 160 mg daily started in January 2021.  Therapy discontinued in December 2021 for progression of disease.  Current therapy:   Eligard 30 mg every 4 months.  This will be repeated in July 2022.   Taxotere chemotherapy given 75 mg per metered square every 3 weeks started on June 13, 2020.  He is here for cycle 9 of therapy.  Interim History: Mr. Tiggs is here for return evaluation.  Since the last visit, he reports no major changes in his health.  He has reported a few more side effects including lower extremity swelling bilaterally and faint peripheral neuropathy.  His neuropathy is predominantly sensory in his upper and lower extremities.  Slight interfering with his function at  this time.  He denies any nausea, vomiting or abdominal pain.  He denies any functional decline or worsening performance status.     .  Medications: Unchanged on review. Current Outpatient Medications  Medication Sig Dispense Refill  . amLODipine (NORVASC) 5 MG tablet Take 5 mg by mouth daily.    . Blood Glucose Monitoring Suppl (ACCU-CHEK GUIDE) w/Device KIT Use to test your blood sugar    . calcium-vitamin D (OSCAL WITH D) 500-200 MG-UNIT tablet Take 1 tablet by mouth 2 (two) times daily. 90 tablet 3  . empagliflozin (JARDIANCE) 25 MG TABS tablet Take 25 mg by mouth daily.    . furosemide (LASIX) 20 MG tablet TAKE 1 TABLET(20 MG) BY MOUTH TWICE DAILY 60 tablet 3  . glimepiride (AMARYL) 2 MG tablet Take 2 mg by mouth daily with breakfast.    . Lancets Micro Thin 33G MISC Use to check your blood sugar    . leuprolide (LUPRON DEPOT, 83-MONTH,) 30 MG injection See admin instructions.    [name] lisinopril (PRINIVIL,ZESTRIL) 40 MG tablet Take 1 tablet by mouth every evening.   3  . methocarbamol (ROBAXIN) 500 MG tablet Take 1 tablet (500 mg total) by mouth every 8 (eight) hours as needed for muscle spasms. 30 tablet 0  . NEULASTA 6 MG/0.6ML injection INJECT SUBCUTANEOUSLY 6MG  48 HOURS AFTER EACH CHEMO  EVERY 21 DAYS 0.6 mL 3  . OYSCO 500 + D 500-200 MG-UNIT TABS TAKE 1 TABLET BY MOUTH TWICE DAILY 90 tablet 6  . prochlorperazine (COMPAZINE) 10 MG tablet Take 1 tablet (  10 mg total) by mouth every 6 (six) hours as needed for nausea or vomiting. 30 tablet 0  . XTANDI 40 MG capsule TAKE 4 CAPSULES BY MOUTH  DAILY 120 capsule 3   No current facility-administered medications for this visit.     Allergies:  Allergies  Allergen Reactions  . Morphine And Related Nausea And Vomiting      Physical Exam:     Blood pressure (!) 143/58, pulse 75, temperature 97.9 F (36.6 C), temperature source Tympanic, resp. rate 20, height '5\' 8"'  (1.727 m), weight 247 lb 1.6 oz (112.1 kg), SpO2 96  %.      ECOG: 0    General appearance: Alert, awake without any distress. Head: Atraumatic without abnormalities Oropharynx: Without any thrush or ulcers. Eyes: No scleral icterus. Lymph nodes: No lymphadenopathy noted in the cervical, supraclavicular, or axillary nodes Heart:regular rate and rhythm, without any murmurs or gallops.   Lung: Clear to auscultation without any rhonchi, wheezes or dullness to percussion. Abdomin: Soft, nontender without any shifting dullness or ascites. Musculoskeletal: No clubbing or cyanosis. Neurological: No motor or sensory deficits. Skin: No rashes or lesions.           Lab Results: Lab Results  Component Value Date   WBC 9.5 11/06/2020   HGB 13.4 11/06/2020   HCT 40.0 11/06/2020   MCV 92.8 11/06/2020   PLT 266 11/06/2020     Chemistry      Component Value Date/Time   NA 138 11/06/2020 0836   K 4.1 11/06/2020 0836   CL 105 11/06/2020 0836   CO2 22 11/06/2020 0836   BUN 15 11/06/2020 0836   CREATININE 0.90 11/06/2020 0836      Component Value Date/Time   CALCIUM 9.8 11/06/2020 0836   ALKPHOS 67 11/06/2020 0836   AST 20 11/06/2020 0836   ALT 19 11/06/2020 0836   BILITOT 0.6 11/06/2020 0836        Results for Travis Bowman, Travis Bowman (MRN [id number]) as of 11/28/2020 07:49  Ref. Range 09/25/2020 08:14 10/16/2020 10:11 11/06/2020 08:36  Prostate Specific Ag, Serum Latest Ref Range: 0.0 - 4.0 ng/mL 17.1 (H) 17.6 (H) 24.7 (H)          Impression and Plan:   60 year old man with:   1.  Advanced prostate cancer with disease to the bone and lymphadenopathy noted since 2021.  He has castration-resistant disease.  His disease status updated and treatment options were reviewed.  He is currently on Taxotere chemotherapy with reasonable tolerance and reasonable response.  He is experiencing more neuropathy and edema however.  His PSA on May 10 did not show slight increase.  Risks and benefits of continuing this treatment were  discussed at this time.  Alternative treatment options were also reviewed including involvement in clinical trials, PSMA targeted therapy, Jevtana chemotherapy among others.  I recommended continuing the current treatment and update his staging scans before the next cycle of therapy.  He is ready to proceed with treatment and we will reduce the dose of Taxotere given his side effects.  He will receive 60 mg per metered square for cycle 9 and tentatively cycle 10 in 3 weeks.   2.  Androgen deprivation: This is to be continued indefinitely.  Next injection will be in July 1308 with complications such as weight gain, hot flashes among others were reiterated.  3.  Bone health: I recommended continuing calcium and vitamin D supplements.   4.  Goals of care: His disease is incurable although  aggressive measures are warranted given his reasonable performance status.  5.  Antiemetics: No nausea or vomiting reported at this time.  Compazine is available to him.  6.  IV access: Peripheral veins are currently in use without any issues.  He deferred the option of Port-A-Cath.  7.  Neutropenia prophylaxis: He remains at risk of neutropenia and possible sepsis.  Growth factor support will be needed after each cycle of therapy.  8.  Edema: He is continue to use Lasix as needed.  9.  Follow-up: He will return in 3 weeks for the next cycle of therapy.  30  minutes were spent on this encounter.  The time was dedicated to reviewing his disease status, discussing treatment options and future plan of care review.    Zola Button, MD 6/1/20227:49 AM

## 2020-11-28 NOTE — Patient Instructions (Signed)
[facility] Discharge Instructions for Patients Receiving Chemotherapy  Today you received the following chemotherapy agents: Taxotere  To help prevent nausea and vomiting after your treatment, we encourage you to take your nausea medication as directed.    If you develop nausea and vomiting that is not controlled by your nausea medication, call the clinic.   BELOW ARE SYMPTOMS THAT SHOULD BE REPORTED IMMEDIATELY:  *FEVER GREATER THAN 100.5 F  *CHILLS WITH OR WITHOUT FEVER  NAUSEA AND VOMITING THAT IS NOT CONTROLLED WITH YOUR NAUSEA MEDICATION  *UNUSUAL SHORTNESS OF BREATH  *UNUSUAL BRUISING OR BLEEDING  TENDERNESS IN MOUTH AND THROAT WITH OR WITHOUT PRESENCE OF ULCERS  *URINARY PROBLEMS  *BOWEL PROBLEMS  UNUSUAL RASH Items with * indicate a potential emergency and should be followed up as soon as possible.  Feel free to call the clinic should you have any questions or concerns. The clinic phone number is [phone].  Please show the CHEMO ALERT CARD at check-in to the Emergency Department and triage nurse.   

## 2020-11-29 ENCOUNTER — Telehealth: Payer: Self-pay | Admitting: *Deleted

## 2020-11-29 LAB — PROSTATE-SPECIFIC AG, SERUM (LABCORP): Prostate Specific Ag, Serum: 34.4 ng/mL — ABNORMAL HIGH (ref 0.0–4.0)

## 2020-11-29 NOTE — Telephone Encounter (Signed)
Notified of message below

## 2020-11-29 NOTE — Telephone Encounter (Signed)
-----   Message from Wyatt Portela, MD sent at 11/29/2020  8:50 AM EDT ----- Please let him know his PSA is up. The plan was discussed with him last visit.

## 2020-12-05 ENCOUNTER — Telehealth: Payer: Self-pay

## 2020-12-05 NOTE — Telephone Encounter (Signed)
Contacted pt per request. Pt asked about when PET scan would be scheduled. Gave pt number to central scheduling to call and get himself scheduled. Pt acknowledged number.

## 2020-12-17 ENCOUNTER — Telehealth: Payer: Self-pay | Admitting: Hematology and Oncology

## 2020-12-17 ENCOUNTER — Telehealth: Payer: Self-pay | Admitting: Oncology

## 2020-12-17 NOTE — Telephone Encounter (Signed)
Cancelled appts per 6/20 sch msg. Spoke to pt who said he is going to have a PET Scan done on 6/22 and he wanted to wait on those results before scheduling anything else.

## 2020-12-17 NOTE — Telephone Encounter (Signed)
Scheduled per 6/17 los. Called and spoke with pt confirmed 7/7 appt

## 2020-12-19 ENCOUNTER — Ambulatory Visit: Payer: 59

## 2020-12-19 ENCOUNTER — Ambulatory Visit: Payer: 59 | Admitting: Oncology

## 2020-12-19 ENCOUNTER — Encounter (HOSPITAL_COMMUNITY)
Admission: RE | Admit: 2020-12-19 | Discharge: 2020-12-19 | Disposition: A | Discharge status: Home / Self Care | Payer: 59 | Source: Ambulatory Visit | Attending: Oncology | Admitting: Oncology

## 2020-12-19 ENCOUNTER — Other Ambulatory Visit: Payer: Self-pay

## 2020-12-19 ENCOUNTER — Other Ambulatory Visit: Payer: 59

## 2020-12-19 DIAGNOSIS — C61 Malignant neoplasm of prostate: Secondary | ICD-10-CM | POA: Diagnosis not present

## 2020-12-19 MED ORDER — PIFLIFOLASTAT F 18 (PYLARIFY) INJECTION
9.0000 | Freq: Once | INTRAVENOUS | Status: AC
  Administered 2020-12-19: 9 via INTRAVENOUS

## 2020-12-20 ENCOUNTER — Telehealth: Payer: Self-pay | Admitting: Oncology

## 2020-12-20 NOTE — Telephone Encounter (Signed)
Sch per 6/23 sch msg,pt aware

## 2020-12-21 ENCOUNTER — Telehealth: Payer: Self-pay | Admitting: Oncology

## 2020-12-21 ENCOUNTER — Inpatient Hospital Stay (HOSPITAL_BASED_OUTPATIENT_CLINIC_OR_DEPARTMENT_OTHER): Payer: 59 | Admitting: Oncology

## 2020-12-21 DIAGNOSIS — C61 Malignant neoplasm of prostate: Secondary | ICD-10-CM | POA: Diagnosis not present

## 2020-12-21 NOTE — Progress Notes (Signed)
Hematology and Oncology Follow Up   Travis Bowman [id number] [birth year] 60 y.o. 12/21/2020 3:32 PM Koirala, [name], MDKoirala, [name], MD   I connected with [name] on 12/21/20 at  3:45 PM EDT by telephone visit and verified that I am speaking with the correct person using two identifiers. I discussed the limitations, risks, security and privacy concerns of performing an evaluation and management service by telemedicine and the availability of in-person appointments. I also discussed with the patient that there may be a patient responsible charge related to this service. The patient expressed understanding and agreed to proceed.  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  He developed castration-resistant after presenting with Gleason score 4+5 = 9 and a PSA 4.59 at the time of diagnosis.   Prior Therapy:    He is status post robotic-assisted laparoscopic radical prostatectomy and bilateral extended pelvic lymphadenectomy on 01/28/2016. The right posterior soft tissue margins were positive and 2 out of 12 lymph nodes on the right side involved with cancer. 0 out of 10 lymph nodes in the left side had any cancer involvement.   He is status post radiation therapy between November 2017 in January 2018.  He had 45 Gy to the prostate and pelvic lymph nodes with additional 23.4 Gy prostatic fossa boost.  He also completed 6 months of androgen deprivation.  His PSA remained undetectable until April 2019 which was 1.38.  He was started on androgen deprivation at that time.   He developed castration-resistant disease in January 2021 after increase in his PSA with a bone scan showed limited bone disease involvement.     Xtandi 160 mg daily started in January 2021.   Therapy discontinued in December 2021 for progression of disease.  Taxotere chemotherapy given 75 mg per metered square every 3 weeks started on June 13, 2020.  He is status post 9 cycles of therapy completed on November 28, 2020.   Current therapy:   Eligard 30 mg every 4 months.  This will be repeated in July 2022.        Interim History: Mr. Balis reports feeling reasonably well and recovering slowly from the effect of chemotherapy.  He reports his lower extremity edema is improving without any worsening neuropathy.  His performance status quality of life remained reasonable and continues to resume activities of daily living.     Medications: I have reviewed the patient's current medications.  Current Outpatient Medications  Medication Sig Dispense Refill   amLODipine (NORVASC) 5 MG tablet Take 5 mg by mouth daily.     Blood Glucose Monitoring Suppl (ACCU-CHEK GUIDE) w/Device KIT Use to test your blood sugar     calcium-vitamin D (OSCAL WITH D) 500-200 MG-UNIT tablet Take 1 tablet by mouth 2 (two) times daily. 90 tablet 3   empagliflozin (JARDIANCE) 25 MG TABS tablet Take 25 mg by mouth daily.     furosemide (LASIX) 20 MG tablet TAKE 1 TABLET(20 MG) BY MOUTH TWICE DAILY 60 tablet 3   glimepiride (AMARYL) 2 MG tablet Take 2 mg by mouth daily with breakfast.     Lancets Micro Thin 33G MISC Use to check your blood sugar     leuprolide (LUPRON DEPOT, 31-MONTH,) 30 MG injection See admin instructions.     lisinopril (PRINIVIL,ZESTRIL) 40  MG tablet Take 1 tablet by mouth every evening.   3   methocarbamol (ROBAXIN) 500 MG tablet Take 1 tablet (500 mg total) by mouth every 8 (eight) hours as needed for muscle spasms. 30 tablet 0   NEULASTA 6 MG/0.6ML injection INJECT SUBCUTANEOUSLY 6MG  48 HOURS AFTER EACH CHEMO  EVERY 21 DAYS 0.6 mL 3   OYSCO 500 + D 500-200 MG-UNIT TABS TAKE 1 TABLET BY MOUTH TWICE DAILY 90 tablet 6   prochlorperazine (COMPAZINE) 10 MG tablet Take 1 tablet (10 mg total)  by mouth every 6 (six) hours as needed for nausea or vomiting. 30 tablet 0   XTANDI 40 MG capsule TAKE 4 CAPSULES BY MOUTH  DAILY 120 capsule 3   No current facility-administered medications for this visit.     Allergies:  Allergies  Allergen Reactions   Morphine And Related Nausea And Vomiting    Past Medical History, Surgical history, Social history, and Family History were reviewed and updated.  Review of Systems:  Remaining ROS negative.      Lab Results: Lab Results  Component Value Date   WBC 9.8 11/28/2020   HGB 12.8 (L) 11/28/2020   HCT 38.7 (L) 11/28/2020   MCV 93.3 11/28/2020   PLT 316 11/28/2020     Chemistry      Component Value Date/Time   NA 139 11/28/2020 0759   K 4.3 11/28/2020 0759   CL 105 11/28/2020 0759   CO2 20 (L) 11/28/2020 0759   BUN 17 11/28/2020 0759   CREATININE 0.91 11/28/2020 0759      Component Value Date/Time   CALCIUM 9.6 11/28/2020 0759   ALKPHOS 61 11/28/2020 0759   AST 17 11/28/2020 0759   ALT 13 11/28/2020 0759   BILITOT 0.4 11/28/2020 0759       IMPRESSION: 1. Progression of multifocal intensely radiotracer avid skeletal metastasis compared to bone scan 06/01/2020. Lesions are subtle or absent on CT imaging 2. Multifocal prostate cancer radiotracer avid nodal metastasis within the mediastinum and LEFT axilla. The mediastinal nodes are decreased in size from comparison CT scan 06/01/2020. 3. Progression of radiotracer avid prostate cancer peritoneal metastasis within the abdomen and pelvis. 4. Increased interstitial thickening in the lungs is indeterminate. Favor inflammatory process, infection or drug reaction. Cannot exclude lymphangitic carcinoma.   Impression and Plan:  60 year old man with:     1.  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    He has completed 9 cycles of Taxotere chemotherapy with reasonable response to therapy with  reduction in his lymphadenopathy and PSA by at least 50%.  His PSA nadir was 17.6 from 79.  His PSA did rise up to 34 on November 28, 2020.  His PET scan obtained on December 19, 2020 was personally reviewed and discussed with the patient and showed mixed response to therapy with improvement in his lymphadenopathy although he is developing peritoneal disease involvement.  Treatment options moving forward were discussed.  Switching to Jevtana chemotherapy versus a PARP inhibitor were reviewed as an option.  Lutetium 113 could also be an option once its logistically available.  The plan is to obtain guardant 360 analysis on peripheral blood next week and based on those results we will proceed with treatment accordingly.      2.  Androgen deprivation: Next injection will be scheduled after July 8 and will be repeated in 4 months.      3.  Follow-up: We will be in  the next few weeks to follow his progress.   I discussed the assessment and treatment plan with the patient. The patient was provided an opportunity to ask questions and all were answered. The patient agreed with the plan and demonstrated an understanding of the instructions.   The patient was advised to call back or seek an in-person evaluation if the symptoms worsen or if the condition fails to improve as anticipated.  I provided 20 minutes of non face-to-face telephone visit time during this encounter. The time was dedicated to reviewing imaging studies, laboratory data, treatment options and future plan of care discussion.  Zola Button, MD 12/21/2020 3:32 PM

## 2020-12-21 NOTE — Telephone Encounter (Signed)
Changed appt to a phone visit per 6/24 sch msg. Pt aware.

## 2020-12-25 ENCOUNTER — Inpatient Hospital Stay: Payer: 59

## 2020-12-25 ENCOUNTER — Other Ambulatory Visit: Payer: Self-pay

## 2020-12-25 DIAGNOSIS — Z5111 Encounter for antineoplastic chemotherapy: Secondary | ICD-10-CM | POA: Diagnosis not present

## 2020-12-25 DIAGNOSIS — C61 Malignant neoplasm of prostate: Secondary | ICD-10-CM

## 2020-12-25 LAB — CBC WITH DIFFERENTIAL (CANCER CENTER ONLY)
Abs Immature Granulocytes: 0.04 10*3/uL (ref 0.00–0.07)
Basophils Absolute: 0 10*3/uL (ref 0.0–0.1)
Basophils Relative: 0 %
Eosinophils Absolute: 0.2 10*3/uL (ref 0.0–0.5)
Eosinophils Relative: 2 %
HCT: 36.2 % — ABNORMAL LOW (ref 39.0–52.0)
Hemoglobin: 12.2 g/dL — ABNORMAL LOW (ref 13.0–17.0)
Immature Granulocytes: 0 %
Lymphocytes Relative: 10 %
Lymphs Abs: 1.1 10*3/uL (ref 0.7–4.0)
MCH: 30.9 pg (ref 26.0–34.0)
MCHC: 33.7 g/dL (ref 30.0–36.0)
MCV: 91.6 fL (ref 80.0–100.0)
Monocytes Absolute: 0.8 10*3/uL (ref 0.1–1.0)
Monocytes Relative: 7 %
Neutro Abs: 9 10*3/uL — ABNORMAL HIGH (ref 1.7–7.7)
Neutrophils Relative %: 81 %
Platelet Count: 271 10*3/uL (ref 150–400)
RBC: 3.95 MIL/uL — ABNORMAL LOW (ref 4.22–5.81)
RDW: 15.5 % (ref 11.5–15.5)
WBC Count: 11.1 10*3/uL — ABNORMAL HIGH (ref 4.0–10.5)
nRBC: 0 % (ref 0.0–0.2)

## 2020-12-25 LAB — CMP (CANCER CENTER ONLY)
ALT: 12 U/L (ref 0–44)
AST: 14 U/L — ABNORMAL LOW (ref 15–41)
Albumin: 3.3 g/dL — ABNORMAL LOW (ref 3.5–5.0)
Alkaline Phosphatase: 66 U/L (ref 38–126)
Anion gap: 10 (ref 5–15)
BUN: 19 mg/dL (ref 6–20)
CO2: 24 mmol/L (ref 22–32)
Calcium: 9.2 mg/dL (ref 8.9–10.3)
Chloride: 106 mmol/L (ref 98–111)
Creatinine: 0.88 mg/dL (ref 0.61–1.24)
GFR, Estimated: >60 mL/min (ref >60)
Glucose, Bld: 169 mg/dL — ABNORMAL HIGH (ref 70–99)
Potassium: 3.8 mmol/L (ref 3.5–5.1)
Sodium: 140 mmol/L (ref 135–145)
Total Bilirubin: 0.4 mg/dL (ref 0.3–1.2)
Total Protein: 6.9 g/dL (ref 6.5–8.1)

## 2020-12-26 LAB — PROSTATE-SPECIFIC AG, SERUM (LABCORP): Prostate Specific Ag, Serum: 59.5 ng/mL — ABNORMAL HIGH (ref 0.0–4.0)

## 2021-01-04 ENCOUNTER — Other Ambulatory Visit: Payer: Self-pay | Admitting: Oncology

## 2021-01-04 ENCOUNTER — Other Ambulatory Visit (HOSPITAL_COMMUNITY): Payer: Self-pay

## 2021-01-04 ENCOUNTER — Telehealth: Payer: Self-pay

## 2021-01-04 ENCOUNTER — Telehealth: Payer: Self-pay | Admitting: Pharmacist

## 2021-01-04 ENCOUNTER — Encounter: Payer: Self-pay | Admitting: Oncology

## 2021-01-04 DIAGNOSIS — C61 Malignant neoplasm of prostate: Secondary | ICD-10-CM

## 2021-01-04 MED ORDER — OLAPARIB 150 MG PO TABS
300.0000 mg | ORAL_TABLET | Freq: Two times a day (BID) | ORAL | 1 refills | Status: DC
  Filled 2021-01-04: qty 120, 30d supply, fill #0

## 2021-01-04 MED ORDER — OLAPARIB 150 MG PO TABS: 300.0000 mg | ORAL_TABLET | Freq: Two times a day (BID) | ORAL | 1 refills | Status: DC

## 2021-01-04 NOTE — Telephone Encounter (Signed)
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  Prescription dose and frequency assessed for appropriateness. Appropriate for therapy initiation.   CBC w/ Diff and CMP from 12/25/20 assessed, labs overall stable - no baseline dose adjustments required at this time.  Current medication list in Epic reviewed, no relevant/significant DDIs with [place] identified.  Evaluated chart and no patient barriers to medication adherence noted.   Patient agreement for treatment documented in MD note on 01/04/21.  Patient's insurance requires Travis Bowman be filled through [employer] - prescription redirected for dispensing.   Oral Oncology [facility] will continue to follow for insurance authorization, copayment issues, initial counseling and start date.  Travis Bowman, PharmD, BCPS Hematology/Oncology Clinical Pharmacist [facility] [phone] 01/04/2021 1:33 PM

## 2021-01-04 NOTE — Telephone Encounter (Signed)
Oral Oncology Patient Advocate Encounter   Received notification from [employer] that prior authorization for Travis Bowman is required.   PA submitted on CoverMyMeds Key [id number] Status is pending   Oral Oncology Clinic will continue to follow.   [place] [facility] [place] Phone [phone] Fax [phone] 01/04/2021 12:58 PM

## 2021-01-04 NOTE — Progress Notes (Signed)
The results of his guardant 360 analysis showed a BRCA2 mutation.  The implication of these findings were discussed with the patient including the need for genetic counseling as well as therapeutic complication with a PARP inhibitor.  Risks and benefits of [name] were discussed at this time and he is agreeable to proceed.  We will arrange to get this started in the near future.

## 2021-01-04 NOTE — Telephone Encounter (Signed)
Oral Oncology Patient Advocate Encounter  Prior Authorization for [name] has been approved.    PA# [id number] Effective dates: 01/04/21 through 01/04/22  Patients co-pay is $0  Oral Oncology Clinic will continue to follow.   [place] [facility] [place] Phone [phone] Fax [phone] 01/04/2021 1:17 PM

## 2021-01-08 ENCOUNTER — Inpatient Hospital Stay: Payer: 59

## 2021-01-08 ENCOUNTER — Other Ambulatory Visit: Payer: Self-pay

## 2021-01-08 ENCOUNTER — Inpatient Hospital Stay: Payer: 59 | Attending: Oncology | Admitting: Oncology

## 2021-01-08 VITALS — BP 167/74 | HR 87 | Temp 98.2°F | Resp 18 | Ht 68.0 in | Wt 245.6 lb

## 2021-01-08 VITALS — BP 142/66 | HR 75 | Temp 98.4°F | Resp 18

## 2021-01-08 DIAGNOSIS — C61 Malignant neoplasm of prostate: Secondary | ICD-10-CM | POA: Diagnosis present

## 2021-01-08 DIAGNOSIS — C7951 Secondary malignant neoplasm of bone: Secondary | ICD-10-CM | POA: Insufficient documentation

## 2021-01-08 DIAGNOSIS — Z5111 Encounter for antineoplastic chemotherapy: Secondary | ICD-10-CM | POA: Insufficient documentation

## 2021-01-08 LAB — GUARDANT 360

## 2021-01-08 MED ORDER — LEUPROLIDE ACETATE (4 MONTH) 30 MG ~~LOC~~ KIT
PACK | SUBCUTANEOUS | Status: AC
  Filled 2021-01-08: qty 30

## 2021-01-08 MED ORDER — LEUPROLIDE ACETATE (4 MONTH) 30 MG ~~LOC~~ KIT
30.0000 mg | PACK | Freq: Once | SUBCUTANEOUS | Status: AC
  Administered 2021-01-08: 30 mg via SUBCUTANEOUS

## 2021-01-08 NOTE — Patient Instructions (Signed)
Leuprolide injection What is this medication? LEUPROLIDE (loo PROE lide) is a man-made hormone. It is used to treat the symptoms of prostate cancer. This medicine may also be used to treat childrenwith early onset of puberty. It may be used for other hormonal conditions. This medicine may be used for other purposes; ask your health care provider orpharmacist if you have questions. COMMON BRAND NAME(S): Lupron What should I tell my care team before I take this medication? They need to know if you have any of these conditions: diabetes heart disease or previous heart attack high blood pressure high cholesterol pain or difficulty passing urine spinal cord metastasis stroke tobacco smoker an unusual or allergic reaction to leuprolide, benzyl alcohol, other medicines, foods, dyes, or preservatives pregnant or trying to get pregnant breast-feeding How should I use this medication? This medicine is for injection under the skin or into a muscle. You will be taught how to prepare and give this medicine. Use exactly as directed. Take your medicine at regular intervals. Do not take your medicine more often thandirected. It is important that you put your used needles and syringes in a special sharps container. Do not put them in a trash can. If you do not have a sharpscontainer, call your pharmacist or healthcare provider to get one. A special MedGuide will be given to you by the pharmacist with eachprescription and refill. Be sure to read this information carefully each time. Talk to your pediatrician regarding the use of this medicine in children. While this medicine may be prescribed for children as young as 8 years for selectedconditions, precautions do apply. Overdosage: If you think you have taken too much of this medicine contact apoison control center or emergency room at once. NOTE: This medicine is only for you. Do not share this medicine with others. What if I miss a dose? If you miss a  dose, take it as soon as you can. If it is almost time for yournext dose, take only that dose. Do not take double or extra doses. What may interact with this medication? Do not take this medicine with any of the following medications: chasteberry cisapride dronedarone pimozide thioridazine This medicine may also interact with the following medications: herbal or dietary supplements, like black cohosh or DHEA male hormones, like estrogens or progestins and birth control pills, patches, rings, or injections male hormones, like testosterone other medicines that prolong the QT interval (abnormal heart rhythm) This list may not describe all possible interactions. Give your health care provider a list of all the medicines, herbs, non-prescription drugs, or dietary supplements you use. Also tell them if you smoke, drink alcohol, or use illegaldrugs. Some items may interact with your medicine. What should I watch for while using this medication? Visit your doctor or health care professional for regular checks on your progress. During the first week, your symptoms may get worse, but then will improve as you continue your treatment. You may get hot flashes, increased bone pain, increased difficulty passing urine, or an aggravation of nerve symptoms. Discuss these effects with your doctor or health care professional, some ofthem may improve with continued use of this medicine. Male patients may experience a menstrual cycle or spotting during the first 2 months of therapy with this medicine. If this continues, contact your doctor orhealth care professional. This medicine may increase blood sugar. Ask your healthcare provider if changesin diet or medicines are needed if you have diabetes. What side effects may I notice from receiving this medication? Side   effects that you should report to your doctor or health care professionalas soon as possible: allergic reactions like skin rash, itching or hives,  swelling of the face, lips, or tongue breathing problems chest pain depression or memory disorders pain in your legs or groin pain at site where injected severe headache signs and symptoms of high blood sugar such as being more thirsty or hungry or having to urinate more than normal. You may also feel very tired or have blurry vision swelling of the feet and legs visual changes vomiting Side effects that usually do not require medical attention (report to yourdoctor or health care professional if they continue or are bothersome): breast swelling or tenderness decrease in sex drive or performance diarrhea hot flashes loss of appetite muscle, joint, or bone pains nausea redness or irritation at site where injected skin problems or acne This list may not describe all possible side effects. Call your doctor for medical advice about side effects. You may report side effects to FDA at1-800-FDA-1088. Where should I keep my medication? Keep out of the reach of children. Store below 25 degrees C (77 degrees F). Do not freeze. Protect from light. Do not use if it is not clear or if there are particles present. Throw away anyunused medicine after the expiration date. NOTE: This sheet is a summary. It may not cover all possible information. If you have questions about this medicine, talk to your doctor, pharmacist, orhealth care provider.  2022 Elsevier/Gold Standard (2019-05-18 10:57:41)  

## 2021-01-08 NOTE — Progress Notes (Signed)
Hematology and Oncology Follow Up Visit  Travis Bowman [id number] 11-13-1960 60 y.o. 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  He presented with Gleason score 4+5 = 9 and a PSA 4.59 in 2017.  He has BRCA2 mutation on guardant 360 analysis.  Prior Therapy:   He is status post robotic-assisted laparoscopic radical prostatectomy and bilateral extended pelvic lymphadenectomy on 01/28/2016. The right posterior soft tissue margins were positive and 2 out of 12 lymph nodes on the right side involved with cancer. 0 out of 10 lymph nodes in the left side had any cancer involvement.  He is status post radiation therapy between November 2017 in January 2018.  He had 45 Gy to the prostate and pelvic lymph nodes with additional 23.4 Gy prostatic fossa boost.  He also completed 6 months of androgen deprivation.  His PSA remained undetectable until April 2019 which was 1.38.  He was started on androgen deprivation at that time.  He developed castration-resistant disease in January 2021 after increase in his PSA with a bone scan showed limited bone disease involvement.   Xtandi 160 mg daily started in January 2021.  Therapy discontinued in December 2021 for progression of disease.  Taxotere chemotherapy given 75 mg per metered square every 3 weeks started on June 13, 2020.   He completed 9 cycles of therapy on November 28, 2020.  Current therapy:   Eligard 30 mg every 4 months.  He has received injection on January 08, 2021 and will be repeated in 4 months.  Lynparza 300 mg twice daily to start on January 08, 2021.  Interim History: [name] returns today for a follow-up visit.  Since the last visit, laboratory testing confirmed the presence of a BRCA2 mutation and [name] has been approved and will start taking it in the near future.    Clinically, he has reported a slight increase pain in his left hip is overall manageable.  He takes Tylenol which has not completely taken care of his pain but has improved.  He still able to work full-time at this time.  He denies any falls or syncope.    .  Medications: Reviewed without changes. Current Outpatient Medications  Medication Sig Dispense Refill   amLODipine (NORVASC) 5 MG tablet Take 5 mg by mouth daily.     Blood Glucose Monitoring Suppl (ACCU-CHEK GUIDE) w/Device KIT Use to test your blood sugar     calcium-vitamin D (OSCAL WITH D) 500-200 MG-UNIT tablet Take 1 tablet by mouth 2 (two) times daily. 90 tablet 3   empagliflozin (JARDIANCE) 25 MG TABS tablet Take 25 mg by mouth daily.     furosemide (LASIX) 20 MG tablet TAKE 1 TABLET(20 MG) BY MOUTH TWICE DAILY 60 tablet 3   glimepiride (AMARYL) 2 MG tablet Take 2 mg by mouth daily with breakfast.     Lancets Micro Thin 33G MISC Use to check your blood sugar     leuprolide (LUPRON DEPOT, 19-MONTH,) 30 MG injection See admin instructions.     lisinopril (PRINIVIL,ZESTRIL) 40 MG tablet Take 1 tablet by mouth every evening.   3   methocarbamol (ROBAXIN) 500 MG tablet Take 1 tablet (500 mg total) by mouth every 8 (eight) hours as needed for muscle spasms. 30 tablet 0   NEULASTA 6 MG/0.6ML injection INJECT SUBCUTANEOUSLY 6MG  48 HOURS AFTER EACH CHEMO  EVERY 21  DAYS 0.6 mL 3   olaparib (LYNPARZA) 150 MG tablet Take 2 tablets (300 mg total) by mouth 2 (two) times daily. Swallow whole. May take with food to decrease nausea and vomiting. 120 tablet 1   OYSCO 500 + D 500-200 MG-UNIT TABS TAKE 1 TABLET BY MOUTH TWICE DAILY 90 tablet 6   prochlorperazine (COMPAZINE) 10 MG tablet Take 1 tablet (10 mg total) by mouth every 6 (six) hours as needed for nausea or vomiting. 30 tablet 0   No current facility-administered medications for this visit.     Allergies:  Allergies  Allergen Reactions   Morphine And Related Nausea And Vomiting       Physical Exam:     Blood pressure (!) 167/74, pulse 87, temperature 98.2 F (36.8 C), temperature source Oral, resp. rate 18, height '5\' 8"'  (1.727 m), weight 245 lb 9.6 oz (111.4 kg), SpO2 97 %.       ECOG: 0   General appearance: Comfortable appearing without any discomfort Head: Normocephalic without any trauma Oropharynx: Mucous membranes are moist and pink without any thrush or ulcers. Eyes: Pupils are equal and round reactive to light. Lymph nodes: No cervical, supraclavicular, inguinal or axillary lymphadenopathy.   Heart:regular rate and rhythm.  S1 and S2 without leg edema. Lung: Clear without any rhonchi or wheezes.  No dullness to percussion. Abdomin: Soft, nontender, nondistended with good bowel sounds.  No hepatosplenomegaly. Musculoskeletal: No joint deformity or effusion.  Full range of motion noted. Neurological: No deficits noted on motor, sensory and deep tendon reflex exam. Skin: No petechial rash or dryness.  Appeared moist.             Lab Results: Lab Results  Component Value Date   WBC 11.1 (H) 12/25/2020   HGB 12.2 (L) 12/25/2020   HCT 36.2 (L) 12/25/2020   MCV 91.6 12/25/2020   PLT 271 12/25/2020     Chemistry      Component Value Date/Time   NA 140 12/25/2020 1239   K 3.8 12/25/2020 1239   CL 106 12/25/2020 1239   CO2 24 12/25/2020 1239   BUN 19 12/25/2020 1239   CREATININE 0.88 12/25/2020 1239      Component Value Date/Time   CALCIUM 9.2 12/25/2020 1239   ALKPHOS 66 12/25/2020 1239   AST 14 (L) 12/25/2020 1239   ALT 12 12/25/2020 1239   BILITOT 0.4 12/25/2020 1239        Results for REY, DANSBY (MRN [id number]) as of 01/08/2021 15:09  Ref. Range 11/06/2020 08:36 11/28/2020 07:59 12/25/2020 12:39  Prostate Specific Ag, Serum Latest Ref Range: 0.0 - 4.0 ng/mL 24.7 (H) 34.4 (H) 59.5 (H)            Impression and Plan:   60 year old man with:   1.  Castration-resistant advanced prostate cancer with  disease to the bone and lymphadenopathy diagnosed in 2021.  His disease status was updated at this time and treatment options were reviewed.  He has plateaued on therapy with Taxotere and guardant 360 analysis showed a BRCA2 mutation.  Risks and benefits of [name] were reviewed today as well as the potential benefit that associated with PARP inhibitors in this subset of prostate cancer patients.  Complications that include nausea, diarrhea, fatigue were reiterated.  He is agreeable to proceed at this time.   2.  Androgen deprivation: He received this today and will be repeated in 4 months.  Complications including weight gain, sexual dysfunction, osteoporosis among others were reiterated.  3.  Bone health: He is currently on calcium and vitamin D supplements as I recommended continuing.   4.  Goals of care: Therapy remains palliative although aggressive measures are warranted given his reasonable performance status.   5.  Bone pain: Manageable with minimal pain medication.  Palliative radiation therapy can be considered if his pain gets worse.  6.  Follow-up: In 6 to 8 weeks for repeat follow-up.  30  minutes were dedicated to this visit.  The time spent on reviewing laboratory data, treatment choices, complication of therapy and future plan of care discussion.    Zola Button, MD 7/12/20223:07 PM

## 2021-01-09 NOTE — Telephone Encounter (Signed)
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   Counseled patient on administration, dosing, side effects, monitoring, drug-food interactions, safe handling, storage, and disposal.  Patient will take Lynparza 116m tablets, 2 tablets (3069m by mouth 2 times daily without regard to food.  Patient counseled that administering with food may increase tolerability.  Patient knows to avoid grapefruit or grapefruit juice while on therapy with Lynparza.  LyLonie Peaktart date: 01/08/21   Adverse effects include but are not limited to: nausea, vomiting, diarrhea, taste changes, mouth sores, fatigue, constipation, decreased blood counts, and joint pain. Patient informed that pneumonitis (including some fatalities) has occurred rarely. Myelodysplastic syndrome/acute myeloid leukemia (MDS/AML) have been reported (rarely) in clinical trials.  Patient has anti-emetic on hand and knows to take it if nausea develops.   Patient will obtain anti diarrheal and alert the office of 4 or more loose stools above baseline.  Reviewed with patient importance of keeping a medication schedule and plan for any missed doses. No barriers to medication adherence identified.  Medication reconciliation performed and medication/allergy list updated.  Insurance authorization for LyLonie Peakas been obtained. Patient's insurance requires that LyLonie Peake filled through [employer]  All questions answered.  Mr. SwHarmesoiced understanding and appreciation.   Medication education handout placed in mail for patient. Patient knows to call the office with questions or concerns. Oral Chemotherapy Clinic phone number provided to patient.   ReLeron CroakPharmD, BCPS Hematology/Oncology  Clinical Pharmacist [id number] 371 8497/13/2022 9:25 AM

## 2021-02-06 ENCOUNTER — Telehealth: Payer: Self-pay | Admitting: *Deleted

## 2021-02-06 ENCOUNTER — Other Ambulatory Visit: Payer: Self-pay | Admitting: *Deleted

## 2021-02-06 DIAGNOSIS — C61 Malignant neoplasm of prostate: Secondary | ICD-10-CM

## 2021-02-06 MED ORDER — DICLOFENAC SODIUM 75 MG PO TBEC: 75.0000 mg | DELAYED_RELEASE_TABLET | Freq: Two times a day (BID) | ORAL | 0 refills | Status: DC | PRN

## 2021-02-06 NOTE — Telephone Encounter (Signed)
Per Dr. Alen Blew, patient notified that prescription for Diclofenac sent to [facility]

## 2021-02-06 NOTE — Telephone Encounter (Signed)
Patient asked if Dr. Alen Blew would consider prescribing Diclofenac Sodium for his continued hip pain?  He received prescription for Diclofenac Sodium 75 mg twice a day while in  ED for fractured ribs.  He stated that while taking it, the pain in his hip decreased substantially and that he was able to walk without pain.

## 2021-02-14 ENCOUNTER — Encounter: Payer: Self-pay | Admitting: Oncology

## 2021-02-22 ENCOUNTER — Other Ambulatory Visit: Payer: Self-pay | Admitting: Oncology

## 2021-02-22 DIAGNOSIS — C61 Malignant neoplasm of prostate: Secondary | ICD-10-CM

## 2021-02-26 ENCOUNTER — Inpatient Hospital Stay: Payer: 59 | Attending: Oncology

## 2021-02-26 ENCOUNTER — Other Ambulatory Visit: Payer: Self-pay

## 2021-02-26 DIAGNOSIS — C61 Malignant neoplasm of prostate: Secondary | ICD-10-CM | POA: Insufficient documentation

## 2021-02-26 DIAGNOSIS — C7951 Secondary malignant neoplasm of bone: Secondary | ICD-10-CM | POA: Insufficient documentation

## 2021-02-26 LAB — CBC WITH DIFFERENTIAL (CANCER CENTER ONLY)
Abs Immature Granulocytes: 0.03 10*3/uL (ref 0.00–0.07)
Basophils Absolute: 0 10*3/uL (ref 0.0–0.1)
Basophils Relative: 0 %
Eosinophils Absolute: 0 10*3/uL (ref 0.0–0.5)
Eosinophils Relative: 0 %
HCT: 29.4 % — ABNORMAL LOW (ref 39.0–52.0)
Hemoglobin: 9.8 g/dL — ABNORMAL LOW (ref 13.0–17.0)
Immature Granulocytes: 0 %
Lymphocytes Relative: 9 %
Lymphs Abs: 0.7 10*3/uL (ref 0.7–4.0)
MCH: 30.3 pg (ref 26.0–34.0)
MCHC: 33.3 g/dL (ref 30.0–36.0)
MCV: 91 fL (ref 80.0–100.0)
Monocytes Absolute: 0.5 10*3/uL (ref 0.1–1.0)
Monocytes Relative: 6 %
Neutro Abs: 6.7 10*3/uL (ref 1.7–7.7)
Neutrophils Relative %: 85 %
Platelet Count: 357 10*3/uL (ref 150–400)
RBC: 3.23 MIL/uL — ABNORMAL LOW (ref 4.22–5.81)
RDW: 19.1 % — ABNORMAL HIGH (ref 11.5–15.5)
WBC Count: 7.9 10*3/uL (ref 4.0–10.5)
nRBC: 0 % (ref 0.0–0.2)

## 2021-02-26 LAB — CMP (CANCER CENTER ONLY)
ALT: 23 U/L (ref 0–44)
AST: 48 U/L — ABNORMAL HIGH (ref 15–41)
Albumin: 2.4 g/dL — ABNORMAL LOW (ref 3.5–5.0)
Alkaline Phosphatase: 262 U/L — ABNORMAL HIGH (ref 38–126)
Anion gap: 10 (ref 5–15)
BUN: 23 mg/dL — ABNORMAL HIGH (ref 6–20)
CO2: 22 mmol/L (ref 22–32)
Calcium: 9.2 mg/dL (ref 8.9–10.3)
Chloride: 108 mmol/L (ref 98–111)
Creatinine: 0.99 mg/dL (ref 0.61–1.24)
GFR, Estimated: >60 mL/min (ref >60)
Glucose, Bld: 118 mg/dL — ABNORMAL HIGH (ref 70–99)
Potassium: 3.8 mmol/L (ref 3.5–5.1)
Sodium: 140 mmol/L (ref 135–145)
Total Bilirubin: 0.3 mg/dL (ref 0.3–1.2)
Total Protein: 6.9 g/dL (ref 6.5–8.1)

## 2021-02-27 LAB — PROSTATE-SPECIFIC AG, SERUM (LABCORP): Prostate Specific Ag, Serum: 651 ng/mL — ABNORMAL HIGH (ref 0.0–4.0)

## 2021-03-01 ENCOUNTER — Inpatient Hospital Stay: Payer: 59 | Attending: Oncology | Admitting: Oncology

## 2021-03-01 ENCOUNTER — Other Ambulatory Visit: Payer: Self-pay

## 2021-03-01 ENCOUNTER — Inpatient Hospital Stay: Payer: 59

## 2021-03-01 VITALS — BP 147/63 | HR 81 | Temp 97.9°F | Resp 18 | Ht 68.0 in | Wt 230.5 lb

## 2021-03-01 DIAGNOSIS — R627 Adult failure to thrive: Secondary | ICD-10-CM | POA: Diagnosis not present

## 2021-03-01 DIAGNOSIS — R0602 Shortness of breath: Secondary | ICD-10-CM | POA: Diagnosis not present

## 2021-03-01 DIAGNOSIS — C7951 Secondary malignant neoplasm of bone: Secondary | ICD-10-CM | POA: Diagnosis present

## 2021-03-01 DIAGNOSIS — C61 Malignant neoplasm of prostate: Secondary | ICD-10-CM | POA: Diagnosis present

## 2021-03-01 DIAGNOSIS — R509 Fever, unspecified: Secondary | ICD-10-CM | POA: Diagnosis not present

## 2021-03-01 DIAGNOSIS — M898X9 Other specified disorders of bone, unspecified site: Secondary | ICD-10-CM | POA: Diagnosis not present

## 2021-03-01 NOTE — Progress Notes (Signed)
Hematology and Oncology Follow Up Visit  Travis Bowman [id number] 03-16-1961 60 y.o. 03/01/2021 2:52 PM Bowman, Travis, MDKoirala, [name], MD   Principle Diagnosis: 60 year old man with advanced Travis cancer with disease to the bone and lymphadenopathy diagnosed in 2017.  He has castration-resistant disease presented with Gleason score 4+5 = 9 and a PSA 4.59  and BRCA2 mutation on guardant 360 analysis.  Prior Therapy:   He is status post robotic-assisted laparoscopic radical prostatectomy and bilateral extended pelvic lymphadenectomy on 01/28/2016. The right posterior soft tissue margins were positive and 2 out of 12 lymph nodes on the right side involved with cancer. 0 out of 10 lymph nodes in the left side had any cancer involvement.  He is status post radiation therapy between November 2017 in January 2018.  He had 45 Gy to the Travis and pelvic lymph nodes with additional 23.4 Gy prostatic fossa boost.  He also completed 6 months of androgen deprivation.  His PSA remained undetectable until April 2019 which was 1.38.  He was started on androgen deprivation at that time.  He developed castration-resistant disease in January 2021 after increase in his PSA with a bone scan showed limited bone disease involvement.   Xtandi 160 mg daily started in January 2021.  Therapy discontinued in December 2021 for progression of disease.  Taxotere chemotherapy given 75 mg per metered square every 3 weeks started on June 13, 2020.   He completed 9 cycles of therapy on November 28, 2020.  Current therapy:   Eligard 30 mg every 4 months.  He has received injection on January 08, 2021 and will be repeated in 4 months.  Lynparza 300 mg twice daily to start on January 08, 2021.  Interim History: Mr. [name] is here for return evaluation.  Since the last visit, he reports no major changes in his health.  He has reported some increased bone pain and weight loss associated with decrease in his appetite.  He has  tolerated Lynparza without any nausea, vomiting or weight loss.  He denies any abdominal distention or early satiety.  His performance status is slightly declining and having difficulties keeping with work-related duties.    .  Medications: Updated on review. Current Outpatient Medications  Medication Sig Dispense Refill   amLODipine (NORVASC) 5 MG tablet Take 5 mg by mouth daily.     Blood Glucose Monitoring Suppl (ACCU-CHEK GUIDE) w/Device KIT Use to test your blood sugar     calcium-vitamin D (OSCAL WITH D) 500-200 MG-UNIT tablet Take 1 tablet by mouth 2 (two) times daily. 90 tablet 3   diclofenac (VOLTAREN) 75 MG EC tablet Take 1 tablet (75 mg total) by mouth 2 (two) times daily as needed. 60 tablet 0   empagliflozin (JARDIANCE) 25 MG TABS tablet Take 25 mg by mouth daily.     furosemide (LASIX) 20 MG tablet TAKE 1 TABLET(20 MG) BY MOUTH TWICE DAILY 60 tablet 3   glimepiride (AMARYL) 2 MG tablet Take 2 mg by mouth daily with breakfast.     Lancets Micro Thin 33G MISC Use to check your blood sugar     leuprolide (LUPRON DEPOT, 47-MONTH,) 30 MG injection See admin instructions.     lisinopril (PRINIVIL,ZESTRIL) 40 MG tablet Take 1 tablet by mouth every evening.   3   LYNPARZA 150 MG tablet TAKE 2 TABLETS BY MOUTH  TWICE DAILY SWALLOW WHOLE.  MAY TAKE WITH FOOD TO  DECREASE NAUSEA AND  VOMITING 120 tablet 1   methocarbamol (ROBAXIN)  500 MG tablet Take 1 tablet (500 mg total) by mouth every 8 (eight) hours as needed for muscle spasms. 30 tablet 0   NEULASTA 6 MG/0.6ML injection INJECT SUBCUTANEOUSLY 6MG  48 HOURS AFTER EACH CHEMO  EVERY 21 DAYS 0.6 mL 3   OYSCO 500 + D 500-200 MG-UNIT TABS TAKE 1 TABLET BY MOUTH TWICE DAILY 90 tablet 6   prochlorperazine (COMPAZINE) 10 MG tablet Take 1 tablet (10 mg total) by mouth every 6 (six) hours as needed for nausea or vomiting. 30 tablet 0   No current facility-administered medications for this visit.     Allergies:  Allergies  Allergen Reactions    Morphine And Related Nausea And Vomiting      Physical Exam:     Blood pressure (!) 147/63, pulse 81, temperature 97.9 F (36.6 C), temperature source Oral, resp. rate 18, height 5' 8" (1.727 m), weight 230 lb 8 oz (104.6 kg), SpO2 96 %.        ECOG: 0    General appearance: Alert, awake without any distress. Head: Atraumatic without abnormalities Oropharynx: Without any thrush or ulcers. Eyes: No scleral icterus. Lymph nodes: No lymphadenopathy noted in the cervical, supraclavicular, or axillary nodes Heart:regular rate and rhythm, without any murmurs or gallops.   Lung: Clear to auscultation without any rhonchi, wheezes or dullness to percussion. Abdomin: Soft, nontender without any shifting dullness or ascites. Musculoskeletal: No clubbing or cyanosis. Neurological: No motor or sensory deficits. Skin: No rashes or lesions.             Lab Results: Lab Results  Component Value Date   WBC 7.9 02/26/2021   HGB 9.8 (L) 02/26/2021   HCT 29.4 (L) 02/26/2021   MCV 91.0 02/26/2021   PLT 357 02/26/2021     Chemistry      Component Value Date/Time   NA 140 02/26/2021 1042   K 3.8 02/26/2021 1042   CL 108 02/26/2021 1042   CO2 22 02/26/2021 1042   BUN 23 (H) 02/26/2021 1042   CREATININE 0.99 02/26/2021 1042      Component Value Date/Time   CALCIUM 9.2 02/26/2021 1042   ALKPHOS 262 (H) 02/26/2021 1042   AST 48 (H) 02/26/2021 1042   ALT 23 02/26/2021 1042   BILITOT 0.3 02/26/2021 1042          Results for Travis Bowman (MRN [id number]) as of 03/01/2021 14:52  Ref. Range 12/25/2020 12:39 02/26/2021 10:42  Travis Specific Ag, Serum Latest Ref Range: 0.0 - 4.0 ng/mL 59.5 (H) 651.0 (H)           Impression and Plan:   60 year old man with:   1.  Advanced Travis cancer with disease to the bone and lymphadenopathy documented in 2021.  He has castration-resistant at this time.  He is currently on [place] given his BRCA2 mutation.   Laboratory data on February 26, 2021 were reviewed and showed a rapid rise in his PSA.  Treatment options moving forward were discussed which includes continuing Lynparza versus switching to different salvage therapy.  These options would include systemic chemotherapy with Jevtana versus lutetium 117.  After discussion today, we will repeat his PSA at this time to confirm his progression and we will make the appropriate referral for Pluvicto   2.  Androgen deprivation: Next injection will be in November 2022.  Complication clinic weight gain and hot flashes were reviewed.  3.  Bone health: Risks and benefits of starting Delton See was reviewed at this time.  Complication occluding osteonecrosis of jaw and hypocalcemia were reviewed.  We will obtain dental clearance before the next visit to start treatment.   4.  Goals of care: His disease is incurable although aggressive measures are warranted given his reasonable performance status.   5.  Bone pain: Related to disease progression but overall manageable.  6.  Follow-up: In 8 weeks for repeat evaluation.  30  minutes were spent on this visit.  Time was dedicated to reviewing laboratory data, disease status update, treatment choices and addressing complication related to cancer and cancer therapy.    Zola Button, MD 9/2/20222:52 PM

## 2021-03-02 LAB — PROSTATE-SPECIFIC AG, SERUM (LABCORP): Prostate Specific Ag, Serum: 770 ng/mL — ABNORMAL HIGH (ref 0.0–4.0)

## 2021-03-05 ENCOUNTER — Telehealth: Payer: Self-pay | Admitting: *Deleted

## 2021-03-05 NOTE — Telephone Encounter (Signed)
Notified of message below

## 2021-03-05 NOTE — Telephone Encounter (Signed)
-----   Message from Wyatt Portela, MD sent at 03/05/2021  8:35 AM EDT ----- Please let him know his PSA is up. He is to stop lynparza like we discussed.

## 2021-03-06 ENCOUNTER — Encounter: Payer: Self-pay | Admitting: Oncology

## 2021-03-07 ENCOUNTER — Telehealth: Payer: Self-pay | Admitting: *Deleted

## 2021-03-07 NOTE — Telephone Encounter (Signed)
"  Travis Bowman [phone]).  FMLA was faxed 02/27/2021 however the [employer] has not received form.  Can you re-fax form.  No HR e-mail provided."  Confirmed fax number, address, patient e-mail address.  Form re-faxed and mailed with confirmations were also e-mailed to [employer].

## 2021-03-08 ENCOUNTER — Other Ambulatory Visit: Payer: Self-pay | Admitting: Oncology

## 2021-03-08 ENCOUNTER — Telehealth: Payer: Self-pay | Admitting: *Deleted

## 2021-03-08 DIAGNOSIS — C61 Malignant neoplasm of prostate: Secondary | ICD-10-CM

## 2021-03-08 MED ORDER — DICLOFENAC SODIUM 75 MG PO TBEC: 75.0000 mg | DELAYED_RELEASE_TABLET | Freq: Two times a day (BID) | ORAL | 0 refills | Status: DC | PRN

## 2021-03-08 NOTE — Telephone Encounter (Signed)
Faxed [employer] Form for Clinical Information to [name] r/t Pluvicto. Fax confirmation received.

## 2021-03-18 ENCOUNTER — Telehealth: Payer: Self-pay | Admitting: *Deleted

## 2021-03-18 ENCOUNTER — Telehealth: Payer: Self-pay | Admitting: Oncology

## 2021-03-18 NOTE — Telephone Encounter (Signed)
Patient called to report concern and needed to see MD if possible.  Was trying to get seen by [facility] but that is currently unavailable.  Patient reports existing swelling in his left leg has gotten worse.  He stated that it is red and swollen from ankle to above thigh.  Denies having any pain with the leg.  Denies heat to the area.  He states that he was told before that it might be from lymph nodes?    He also reports increasing fatigue and shortness of breath.  Breathing was not labored over the phone and speech was fluid without any issues with trying to catch breath.  He states he is unable to walk to the mailbox without being short of breath and that is when he notices the most.  Patient had Covid X 1 month ago.    He hasn't started the new medication that Dr Alen Blew has ordered [name]) as he is still waiting on delivery.  Next visit is labs 10/25 and MD 11/1.  Routed to Dr Alen Blew to please advise if he needs to be seen here or at PCP or how best to handle this situation.

## 2021-03-18 NOTE — Telephone Encounter (Signed)
Sch  per 9/19 in basket, pt aware

## 2021-03-19 ENCOUNTER — Other Ambulatory Visit: Payer: Self-pay

## 2021-03-19 ENCOUNTER — Inpatient Hospital Stay: Payer: 59

## 2021-03-19 ENCOUNTER — Emergency Department (HOSPITAL_COMMUNITY): Payer: 59

## 2021-03-19 ENCOUNTER — Inpatient Hospital Stay (HOSPITAL_COMMUNITY)
Admission: EM | Admit: 2021-03-19 | Discharge: 2021-03-22 | DRG: 871 | Disposition: A | Discharge status: Home / Self Care | Payer: 59 | Source: Ambulatory Visit | Attending: Family Medicine | Admitting: Family Medicine

## 2021-03-19 ENCOUNTER — Encounter (HOSPITAL_COMMUNITY): Payer: Self-pay

## 2021-03-19 ENCOUNTER — Telehealth: Payer: Self-pay | Admitting: *Deleted

## 2021-03-19 ENCOUNTER — Inpatient Hospital Stay (HOSPITAL_BASED_OUTPATIENT_CLINIC_OR_DEPARTMENT_OTHER): Payer: 59 | Admitting: Oncology

## 2021-03-19 VITALS — BP 139/68 | HR 102 | Temp 100.4°F | Resp 18 | Wt 222.4 lb

## 2021-03-19 DIAGNOSIS — Z8279 Family history of other congenital malformations, deformations and chromosomal abnormalities: Secondary | ICD-10-CM | POA: Diagnosis not present

## 2021-03-19 DIAGNOSIS — R600 Localized edema: Secondary | ICD-10-CM | POA: Diagnosis present

## 2021-03-19 DIAGNOSIS — R6520 Severe sepsis without septic shock: Secondary | ICD-10-CM | POA: Diagnosis present

## 2021-03-19 DIAGNOSIS — Z833 Family history of diabetes mellitus: Secondary | ICD-10-CM

## 2021-03-19 DIAGNOSIS — R0603 Acute respiratory distress: Secondary | ICD-10-CM | POA: Diagnosis not present

## 2021-03-19 DIAGNOSIS — J918 Pleural effusion in other conditions classified elsewhere: Secondary | ICD-10-CM | POA: Diagnosis present

## 2021-03-19 DIAGNOSIS — J90 Pleural effusion, not elsewhere classified: Secondary | ICD-10-CM | POA: Diagnosis not present

## 2021-03-19 DIAGNOSIS — Z8249 Family history of ischemic heart disease and other diseases of the circulatory system: Secondary | ICD-10-CM

## 2021-03-19 DIAGNOSIS — E669 Obesity, unspecified: Secondary | ICD-10-CM | POA: Diagnosis present

## 2021-03-19 DIAGNOSIS — Z20822 Contact with and (suspected) exposure to covid-19: Secondary | ICD-10-CM | POA: Diagnosis present

## 2021-03-19 DIAGNOSIS — I10 Essential (primary) hypertension: Secondary | ICD-10-CM | POA: Diagnosis present

## 2021-03-19 DIAGNOSIS — C61 Malignant neoplasm of prostate: Secondary | ICD-10-CM | POA: Diagnosis not present

## 2021-03-19 DIAGNOSIS — A419 Sepsis, unspecified organism: Principal | ICD-10-CM | POA: Diagnosis present

## 2021-03-19 DIAGNOSIS — E119 Type 2 diabetes mellitus without complications: Secondary | ICD-10-CM | POA: Diagnosis present

## 2021-03-19 DIAGNOSIS — R7989 Other specified abnormal findings of blood chemistry: Secondary | ICD-10-CM | POA: Diagnosis not present

## 2021-03-19 DIAGNOSIS — Z8546 Personal history of malignant neoplasm of prostate: Secondary | ICD-10-CM | POA: Diagnosis not present

## 2021-03-19 DIAGNOSIS — Z6833 Body mass index (BMI) 33.0-33.9, adult: Secondary | ICD-10-CM | POA: Diagnosis not present

## 2021-03-19 DIAGNOSIS — U071 COVID-19: Secondary | ICD-10-CM | POA: Diagnosis not present

## 2021-03-19 DIAGNOSIS — J159 Unspecified bacterial pneumonia: Secondary | ICD-10-CM | POA: Diagnosis present

## 2021-03-19 DIAGNOSIS — K219 Gastro-esophageal reflux disease without esophagitis: Secondary | ICD-10-CM | POA: Diagnosis present

## 2021-03-19 DIAGNOSIS — Z87442 Personal history of urinary calculi: Secondary | ICD-10-CM

## 2021-03-19 DIAGNOSIS — Z823 Family history of stroke: Secondary | ICD-10-CM | POA: Diagnosis not present

## 2021-03-19 DIAGNOSIS — Z8616 Personal history of COVID-19: Secondary | ICD-10-CM | POA: Diagnosis not present

## 2021-03-19 DIAGNOSIS — Z9079 Acquired absence of other genital organ(s): Secondary | ICD-10-CM

## 2021-03-19 DIAGNOSIS — Z885 Allergy status to narcotic agent status: Secondary | ICD-10-CM

## 2021-03-19 DIAGNOSIS — F17290 Nicotine dependence, other tobacco product, uncomplicated: Secondary | ICD-10-CM | POA: Diagnosis present

## 2021-03-19 DIAGNOSIS — Z9889 Other specified postprocedural states: Secondary | ICD-10-CM

## 2021-03-19 DIAGNOSIS — M7989 Other specified soft tissue disorders: Secondary | ICD-10-CM | POA: Diagnosis not present

## 2021-03-19 DIAGNOSIS — R509 Fever, unspecified: Secondary | ICD-10-CM

## 2021-03-19 DIAGNOSIS — Z8619 Personal history of other infectious and parasitic diseases: Secondary | ICD-10-CM

## 2021-03-19 DIAGNOSIS — C787 Secondary malignant neoplasm of liver and intrahepatic bile duct: Secondary | ICD-10-CM | POA: Diagnosis present

## 2021-03-19 DIAGNOSIS — Z7984 Long term (current) use of oral hypoglycemic drugs: Secondary | ICD-10-CM

## 2021-03-19 DIAGNOSIS — C7951 Secondary malignant neoplasm of bone: Secondary | ICD-10-CM | POA: Diagnosis present

## 2021-03-19 DIAGNOSIS — J1282 Pneumonia due to coronavirus disease 2019: Secondary | ICD-10-CM

## 2021-03-19 DIAGNOSIS — J189 Pneumonia, unspecified organism: Secondary | ICD-10-CM

## 2021-03-19 DIAGNOSIS — R0602 Shortness of breath: Secondary | ICD-10-CM | POA: Diagnosis not present

## 2021-03-19 DIAGNOSIS — Z79899 Other long term (current) drug therapy: Secondary | ICD-10-CM

## 2021-03-19 DIAGNOSIS — M898X9 Other specified disorders of bone, unspecified site: Secondary | ICD-10-CM | POA: Diagnosis not present

## 2021-03-19 DIAGNOSIS — D630 Anemia in neoplastic disease: Secondary | ICD-10-CM | POA: Diagnosis present

## 2021-03-19 DIAGNOSIS — G893 Neoplasm related pain (acute) (chronic): Secondary | ICD-10-CM | POA: Diagnosis present

## 2021-03-19 DIAGNOSIS — R627 Adult failure to thrive: Secondary | ICD-10-CM | POA: Diagnosis not present

## 2021-03-19 DIAGNOSIS — C779 Secondary and unspecified malignant neoplasm of lymph node, unspecified: Secondary | ICD-10-CM | POA: Diagnosis present

## 2021-03-19 DIAGNOSIS — I248 Other forms of acute ischemic heart disease: Secondary | ICD-10-CM | POA: Diagnosis present

## 2021-03-19 DIAGNOSIS — D72829 Elevated white blood cell count, unspecified: Secondary | ICD-10-CM

## 2021-03-19 DIAGNOSIS — R109 Unspecified abdominal pain: Secondary | ICD-10-CM

## 2021-03-19 DIAGNOSIS — J188 Other pneumonia, unspecified organism: Secondary | ICD-10-CM | POA: Diagnosis not present

## 2021-03-19 LAB — PROTIME-INR
INR: 1.1 (ref 0.8–1.2)
Prothrombin Time: 14.6 seconds (ref 11.4–15.2)

## 2021-03-19 LAB — COMPREHENSIVE METABOLIC PANEL
ALT: 30 U/L (ref 0–44)
AST: 90 U/L — ABNORMAL HIGH (ref 15–41)
Albumin: 2.2 g/dL — ABNORMAL LOW (ref 3.5–5.0)
Alkaline Phosphatase: 412 U/L — ABNORMAL HIGH (ref 38–126)
Anion gap: 11 (ref 5–15)
BUN: 21 mg/dL — ABNORMAL HIGH (ref 6–20)
CO2: 22 mmol/L (ref 22–32)
Calcium: 8.7 mg/dL — ABNORMAL LOW (ref 8.9–10.3)
Chloride: 104 mmol/L (ref 98–111)
Creatinine, Ser: 0.72 mg/dL (ref 0.61–1.24)
GFR, Estimated: >60 mL/min (ref >60)
Glucose, Bld: 94 mg/dL (ref 70–99)
Potassium: 4.6 mmol/L (ref 3.5–5.1)
Sodium: 137 mmol/L (ref 135–145)
Total Bilirubin: 1.1 mg/dL (ref 0.3–1.2)
Total Protein: 7 g/dL (ref 6.5–8.1)

## 2021-03-19 LAB — CBC WITH DIFFERENTIAL/PLATELET
Abs Immature Granulocytes: 0.25 10*3/uL — ABNORMAL HIGH (ref 0.00–0.07)
Basophils Absolute: 0 10*3/uL (ref 0.0–0.1)
Basophils Relative: 0 %
Eosinophils Absolute: 0 10*3/uL (ref 0.0–0.5)
Eosinophils Relative: 0 %
HCT: 28.3 % — ABNORMAL LOW (ref 39.0–52.0)
Hemoglobin: 8.8 g/dL — ABNORMAL LOW (ref 13.0–17.0)
Immature Granulocytes: 1 %
Lymphocytes Relative: 4 %
Lymphs Abs: 0.8 10*3/uL (ref 0.7–4.0)
MCH: 29.7 pg (ref 26.0–34.0)
MCHC: 31.1 g/dL (ref 30.0–36.0)
MCV: 95.6 fL (ref 80.0–100.0)
Monocytes Absolute: 1.7 10*3/uL — ABNORMAL HIGH (ref 0.1–1.0)
Monocytes Relative: 8 %
Neutro Abs: 17.6 10*3/uL — ABNORMAL HIGH (ref 1.7–7.7)
Neutrophils Relative %: 87 %
Platelets: 395 10*3/uL (ref 150–400)
RBC: 2.96 MIL/uL — ABNORMAL LOW (ref 4.22–5.81)
RDW: 20 % — ABNORMAL HIGH (ref 11.5–15.5)
WBC: 20.4 10*3/uL — ABNORMAL HIGH (ref 4.0–10.5)
nRBC: 0 % (ref 0.0–0.2)

## 2021-03-19 LAB — URINALYSIS, ROUTINE W REFLEX MICROSCOPIC
Bilirubin Urine: NEGATIVE
Glucose, UA: NEGATIVE mg/dL
Ketones, ur: NEGATIVE mg/dL
Leukocytes,Ua: NEGATIVE
Nitrite: NEGATIVE
Protein, ur: >300 mg/dL — AB
Specific Gravity, Urine: 1.025 (ref 1.005–1.030)
pH: 6 (ref 5.0–8.0)

## 2021-03-19 LAB — CBC WITH DIFFERENTIAL (CANCER CENTER ONLY)
Abs Immature Granulocytes: 0.13 10*3/uL — ABNORMAL HIGH (ref 0.00–0.07)
Basophils Absolute: 0 10*3/uL (ref 0.0–0.1)
Basophils Relative: 0 %
Eosinophils Absolute: 0 10*3/uL (ref 0.0–0.5)
Eosinophils Relative: 0 %
HCT: 28 % — ABNORMAL LOW (ref 39.0–52.0)
Hemoglobin: 9 g/dL — ABNORMAL LOW (ref 13.0–17.0)
Immature Granulocytes: 1 %
Lymphocytes Relative: 3 %
Lymphs Abs: 0.6 10*3/uL — ABNORMAL LOW (ref 0.7–4.0)
MCH: 29.6 pg (ref 26.0–34.0)
MCHC: 32.1 g/dL (ref 30.0–36.0)
MCV: 92.1 fL (ref 80.0–100.0)
Monocytes Absolute: 1.7 10*3/uL — ABNORMAL HIGH (ref 0.1–1.0)
Monocytes Relative: 8 %
Neutro Abs: 17.9 10*3/uL — ABNORMAL HIGH (ref 1.7–7.7)
Neutrophils Relative %: 88 %
Platelet Count: 395 10*3/uL (ref 150–400)
RBC: 3.04 MIL/uL — ABNORMAL LOW (ref 4.22–5.81)
RDW: 19.8 % — ABNORMAL HIGH (ref 11.5–15.5)
WBC Count: 20.3 10*3/uL — ABNORMAL HIGH (ref 4.0–10.5)
nRBC: 0 % (ref 0.0–0.2)

## 2021-03-19 LAB — MRSA NEXT GEN BY PCR, NASAL: MRSA by PCR Next Gen: NOT DETECTED

## 2021-03-19 LAB — CMP (CANCER CENTER ONLY)
ALT: 28 U/L (ref 0–44)
AST: 98 U/L — ABNORMAL HIGH (ref 15–41)
Albumin: 2 g/dL — ABNORMAL LOW (ref 3.5–5.0)
Alkaline Phosphatase: 458 U/L — ABNORMAL HIGH (ref 38–126)
Anion gap: 13 (ref 5–15)
BUN: 18 mg/dL (ref 6–20)
CO2: 20 mmol/L — ABNORMAL LOW (ref 22–32)
Calcium: 9.1 mg/dL (ref 8.9–10.3)
Chloride: 101 mmol/L (ref 98–111)
Creatinine: 0.74 mg/dL (ref 0.61–1.24)
GFR, Estimated: >60 mL/min (ref >60)
Glucose, Bld: 78 mg/dL (ref 70–99)
Potassium: 4.2 mmol/L (ref 3.5–5.1)
Sodium: 134 mmol/L — ABNORMAL LOW (ref 135–145)
Total Bilirubin: 0.8 mg/dL (ref 0.3–1.2)
Total Protein: 6.8 g/dL (ref 6.5–8.1)

## 2021-03-19 LAB — URINALYSIS, MICROSCOPIC (REFLEX)

## 2021-03-19 LAB — RESP PANEL BY RT-PCR (FLU A&B, COVID) ARPGX2
Influenza A by PCR: NEGATIVE
Influenza B by PCR: NEGATIVE
SARS Coronavirus 2 by RT PCR: POSITIVE — AB

## 2021-03-19 LAB — LACTIC ACID, PLASMA
Lactic Acid, Venous: 2 mmol/L (ref 0.5–1.9)
Lactic Acid, Venous: 1.9 mmol/L (ref 0.5–1.9)

## 2021-03-19 MED ORDER — VANCOMYCIN HCL 2000 MG/400ML IV SOLN
2000.0000 mg | Freq: Once | INTRAVENOUS | Status: AC
  Administered 2021-03-19: 2000 mg via INTRAVENOUS
  Filled 2021-03-19: qty 400

## 2021-03-19 MED ORDER — OXYCODONE HCL 5 MG PO TABS
5.0000 mg | ORAL_TABLET | Freq: Once | ORAL | Status: AC
  Administered 2021-03-19: 5 mg via ORAL
  Filled 2021-03-19: qty 1

## 2021-03-19 MED ORDER — IOHEXOL 350 MG/ML SOLN
60.0000 mL | Freq: Once | INTRAVENOUS | Status: AC | PRN
  Administered 2021-03-19: 60 mL via INTRAVENOUS

## 2021-03-19 MED ORDER — SODIUM CHLORIDE 0.9 % IV SOLN
2.0000 g | Freq: Once | INTRAVENOUS | Status: AC
  Administered 2021-03-19: 2 g via INTRAVENOUS
  Filled 2021-03-19: qty 2

## 2021-03-19 MED ORDER — LACTATED RINGERS IV BOLUS
1000.0000 mL | Freq: Once | INTRAVENOUS | Status: AC
  Administered 2021-03-19: 1000 mL via INTRAVENOUS

## 2021-03-19 NOTE — H&P (Signed)
    [facility]   PATIENT NAME: Travis Bowman    MR#:  [id number]  DATE OF BIRTH:  07/06/1960  DATE OF ADMISSION:  03/19/2021  PRIMARY CARE PHYSICIAN: Koirala, [name], MD   Patient is coming from: Home  REQUESTING/REFERRING PHYSICIAN: Lawsing, James, MD  CHIEF COMPLAINT:   Chief Complaint  Patient presents with  . Fever  . Nausea  . Weakness    HISTORY OF PRESENT ILLNESS:  Travis Bowman is a 60 y.o. Caucasian male with medical history significant for multiple medical problems, mentioned below, who presented to the ER with acute onset of dyspnea with associated fever and tachycardia when he was seen at the clinic earlier today.  He has been having symptoms since Thursday with dyspnea and his fever developed yesterday with a temperature of 101 with associated chills.  He admitted to cough productive of clear sputum and denies nausea or vomiting or diarrhea.  No loss of taste or smell.  No chest pain except with cough and he denied palpitations.  He had 2 vaccines and 1 booster for COVID-19.  ED Course: When he came to the ER respiratory rate was 35 and otherwise vital signs were within normal.  Albumin was 2.2 with otherwise unremarkable CMP.  COVID-19 PCR came back positive and UA showed more than 300 protein influenza antigens were negative.  CBC showed leukocytosis with 20.4 with leukocytosis and anemia. EKG as reviewed by me : Sinus rhythm with a rate of 90 with left axis deviation no acute findings Imaging: Chest x-ray showed Mild interstitial edema with mild areas of superimposed atelectasis and/or early infiltrate within the mid right lung and right lung base. Chest CT a revealed the following: 1. Moderate severity mediastinal and bilateral hilar lymphadenopathy, with multiple bilateral noncalcified lung nodules. Findings are concerning for metastatic disease. 2. Multiple heterogeneous low-attenuation liver lesions, concerning for metastatic disease. 3. Small to moderate  sized right-sided pleural effusion. 4. Mild to moderate severity bilateral areas of atelectasis. 5. Findings likely consistent with osseous metastasis throughout the thoracic and lumbar spine  The patient was given IV cefepime and vancomycin, 1 L bolus of IV lactated Ringer and 5 mg of p.o. oxycodone admitted to a telemetry bed for further evaluation and management. PAST MEDICAL HISTORY:   Past Medical History:  Diagnosis Date  . At risk for sleep apnea    STOP-BANG= 7   SENT TO PCP 02-02-2015  . ED (erectile dysfunction)   . GERD (gastroesophageal reflux disease)   . History of blood transfusion   . History of CHF (congestive heart failure)    mild acute episode 12-05-2013 in setting of HTN crisis--  resolved  . 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 History of nephrolithiasis   . 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 Hypertension   . Malignant essential hypertension   . Prostate cancer (HCC) dx'd 08/2015  . Right knee meniscal tear   . Type 2 diabetes mellitus (HCC)   . Wears contact lenses     PAST SURGICAL HISTORY:   Past Surgical History:  Procedure Laterality Date  . CATARACT EXTRACTION W/ INTRAOCULAR LENS  IMPLANT, BILATERAL  2003/  2006  . EXTRACORPOREAL SHOCK WAVE LITHOTRIPSY  x5  last   one 2006  . KNEE ARTHROSCOPY WITH MEDIAL MENISECTOMY Right 02/08/2015   Procedure: RIGHT KNEE ARTHROSCOPY, PARTIAL MEDIAL MENISCECTOMY, CHRONDROPLASTY;  Surgeon: Sydnee Cabal, MD;  Location: [facility];  Service: Orthopedics;  Laterality: Right;  . LYMPHADENECTOMY Bilateral 01/28/2016   Procedure: PELVIC LYMPHADENECTOMY;  Surgeon: Raynelle Bring, MD;  Location: [facility];  Service: Urology;  Laterality: Bilateral;  . ORCHIECTOMY Right age 57  . PERCUTANEOUS  NEPHROSTOLITHOTOMY  x2  last one 1980's  . PROSTATE BIOPSY    . ROBOT ASSISTED LAPAROSCOPIC RADICAL PROSTATECTOMY N/A 01/28/2016   Procedure: XI ROBOTIC ASSISTED LAPAROSCOPIC RADICAL PROSTATECTOMY LEVEL 2;  Surgeon: Raynelle Bring, MD;  Location: [facility];  Service: Urology;  Laterality: N/A;  . TONSILLECTOMY AND ADENOIDECTOMY  as child  . TRANSTHORACIC ECHOCARDIOGRAM  12-06-2013   mild LVH,  ef 50-55%,  mild LAE and RAE,  mild dilated aortic root  . TRIGGER FINGER RELEASE Bilateral last one 2013   x 2  . URETEROLITHOTOMY  1990's    SOCIAL HISTORY:   Social History   Tobacco Use  . Smoking status: Light Smoker    Packs/day: 0.00    Types: Cigars, Cigarettes  . Smokeless tobacco: Never  . Tobacco comments:    currently occasoinal cigar--  quit cigarettes in 2007 had smoked for 25 yrs  Substance Use Topics  . Alcohol use: Yes    Alcohol/week: 2.0 standard drinks    Types: 2 Cans of beer per week    Comment: 2 beers week    FAMILY HISTORY:   Family History  Problem Relation Age of Onset  . Stroke Mother   . CAD Mother   . Alcohol abuse Father   . Diabetes Brother   . Marfan syndrome Brother     DRUG ALLERGIES:   Allergies  Allergen Reactions  . Morphine And Related Nausea And Vomiting    REVIEW OF SYSTEMS:   ROS As per history of present illness. All pertinent systems were reviewed above. Constitutional, HEENT, cardiovascular, respiratory, GI, GU, musculoskeletal, neuro, psychiatric, endocrine, integumentary and hematologic systems were reviewed and are otherwise negative/unremarkable except for positive findings mentioned above in the HPI.   MEDICATIONS AT HOME:   Prior to Admission medications   Medication Sig Start Date End Date Taking? Authorizing Provider  amLODipine (NORVASC) 5 MG tablet Take 5 mg by mouth daily.    [provider]  Blood Glucose Monitoring Suppl (ACCU-CHEK GUIDE) w/Device KIT Use to test your blood sugar 11/25/18   [provider]  calcium-vitamin D (OSCAL WITH D) 500-200 MG-UNIT tablet Take 1 tablet by mouth 2 (two) times daily. 01/11/20   Wyatt Portela, MD  diclofenac (VOLTAREN) 75 MG EC tablet Take 1 tablet (75 mg total) by mouth 2 (two) times daily as needed. 03/08/21   Brunetta Genera, MD  empagliflozin (JARDIANCE) 25 MG TABS tablet Take 25 mg by mouth daily.    [provider]  furosemide (LASIX) 20 MG tablet TAKE 1 TABLET(20 MG) BY MOUTH TWICE DAILY 11/05/20   Wyatt Portela, MD  glimepiride (AMARYL) 2 MG tablet Take 2 mg by mouth daily with breakfast.    [provider]  Lancets Micro Thin 33G MISC Use to check your blood sugar 11/25/18   [provider]  leuprolide (LUPRON DEPOT, 48-MONTH,) 30 MG injection See admin instructions.    [provider]  lisinopril (PRINIVIL,ZESTRIL) 40 MG tablet Take 1 tablet by mouth every evening.  06/12/14   [provider]  LYNPARZA 150 MG tablet TAKE 2 TABLETS BY MOUTH  TWICE DAILY SWALLOW WHOLE.  MAY TAKE WITH FOOD TO  DECREASE NAUSEA AND  VOMITING 02/22/21   Shadad, Firas N, MD  methocarbamol (ROBAXIN) 500 MG tablet Take 1 tablet (500 mg total) by mouth every 8 (eight) hours as needed for muscle spasms. 06/19/20   Tanner, Van E., PA-C  NEULASTA 6 MG/0.6ML injection INJECT SUBCUTANEOUSLY 6MG  48 HOURS AFTER EACH CHEMO  EVERY 21 DAYS 10/25/20   Shadad, Firas N, MD  OYSCO 500 + D 500-200 MG-UNIT TABS TAKE 1 TABLET BY MOUTH TWICE DAILY 08/22/20   Shadad, Firas N, MD  prochlorperazine (COMPAZINE) 10 MG tablet Take 1 tablet (10 mg total) by mouth every 6 (six) hours as needed for nausea or vomiting. 06/04/20   Shadad, Firas N, MD      VITAL SIGNS:  Blood pressure (!) 148/87, pulse 95, temperature 98.5 F (36.9 C), temperature source Oral, resp. rate (!) 33, SpO2 96 %.  PHYSICAL EXAMINATION:  Physical Exam  GENERAL:  60 y.o.-year-old Caucasian male patient lying in the bed with no acute distress.  EYES: Pupils equal, round,  reactive to light and accommodation. No scleral icterus. Extraocular muscles intact.  HEENT: Head atraumatic, normocephalic. Oropharynx and nasopharynx clear.  NECK:  Supple, no jugular venous distention. No thyroid enlargement, no tenderness.  LUNGS: Diminished bibasilar breath sounds with bibasal and midlung zone crackles.  No use of accessory muscles of respiration.  CARDIOVASCULAR: Regular rate and rhythm, S1, S2 normal. No murmurs, rubs, or gallops.  ABDOMEN: Soft, nondistended, nontender. Bowel sounds present. No organomegaly or mass.  EXTREMITIES: No pedal edema, cyanosis, or clubbing.  NEUROLOGIC: Cranial nerves II through XII are intact. Muscle strength 5/5 in all extremities. Sensation intact. Gait not checked.  PSYCHIATRIC: The patient is alert and oriented x 3.  Normal affect and good eye contact. SKIN: No obvious rash, lesion, or ulcer.   LABORATORY PANEL:   CBC Recent Labs  Lab 03/19/21 1600  WBC 20.4*  HGB 8.8*  HCT 28.3*  PLT 395   ------------------------------------------------------------------------------------------------------------------  Chemistries  Recent Labs  Lab 03/19/21 1600  NA 137  K 4.6  CL 104  CO2 22  GLUCOSE 94  BUN 21*  CREATININE 0.72  CALCIUM 8.7*  AST 90*  ALT 30  ALKPHOS 412*  BILITOT 1.1   ------------------------------------------------------------------------------------------------------------------  Cardiac Enzymes No results for input(s): TROPONINI in the last 168 hours. ------------------------------------------------------------------------------------------------------------------  RADIOLOGY:  CT Chest W Contrast  Result Date: 03/19/2021 CLINICAL DATA:  Suspected pneumonia, effusion or abscess. EXAM: CT CHEST WITH CONTRAST TECHNIQUE: Multidetector CT imaging of the chest was performed during intravenous contrast administration. CONTRAST:  60mL OMNIPAQUE IOHEXOL 350 MG/ML SOLN COMPARISON:  None. FINDINGS:  Cardiovascular: No significant vascular findings. Normal heart size with marked severity coronary artery calcification. No pericardial effusion. Mediastinum/Nodes: There is moderate severity pretracheal, AP window, subcarinal and bilateral hilar lymphadenopathy. The largest lymph node is seen within the subcarinal region and measures approximately 2.6 cm x 2.1 cm. Thyroid gland, trachea, and esophagus demonstrate no significant findings. Lungs/Pleura: A 1.1 cm noncalcified lung nodule is seen within the lateral aspect of the right upper lobe (axial CT image 35, CT series 7). An 8 mm noncalcified lung nodule is seen along the anterior aspect of the left lower lobe (axial CT image 57, CT series 7). A 7 mm noncalcified lung nodule is seen within the posterolateral aspect of the left lower lobe (axial CT image 90, CT series 7). This   measures approximately 3 mm on the prior exam (image 46, CT series 2). An additional 7 mm noncalcified lung nodule is noted (axial CT image 92, CT series 7). Mild to moderate severity areas of atelectasis are seen within the bilateral upper lobes, right middle lobe and right lower lobe. There is a small to moderate sized right-sided pleural effusion. No pneumothorax is identified. Upper Abdomen: Multiple heterogeneous low-attenuation liver lesions are seen. The largest measures approximately 5.6 cm x 5.3 cm and is seen within the left lobe (axial CT image 120, CT series 2). Musculoskeletal: Multiple sclerotic areas are seen throughout the thoracic and lumbar spine. IMPRESSION: 1. Moderate severity mediastinal and bilateral hilar lymphadenopathy, with multiple bilateral noncalcified lung nodules. Findings are concerning for metastatic disease. 2. Multiple heterogeneous low-attenuation liver lesions, concerning for metastatic disease. 3. Small to moderate sized right-sided pleural effusion. 4. Mild to moderate severity bilateral areas of atelectasis. 5. Findings likely consistent with osseous  metastasis throughout the thoracic and lumbar spine. Electronically Signed   By: Virgina Norfolk M.D.   On: 03/19/2021 19:37   DG Chest Port 1 View  Result Date: 03/19/2021 CLINICAL DATA:  Shortness of breath. EXAM: PORTABLE CHEST 1 VIEW COMPARISON:  October 05, 2020 FINDINGS: Mildly increased interstitial lung markings are seen. Mild areas of superimposed atelectasis and/or early infiltrate are noted within the mid right lung and right lung base. There is no evidence of a pleural effusion or pneumothorax. The heart size and mediastinal contours are within normal limits. The visualized skeletal structures are unremarkable. IMPRESSION: Mild interstitial edema with mild areas of superimposed atelectasis and/or early infiltrate within the mid right lung and right lung base. Electronically Signed   By: Virgina Norfolk M.D.   On: 03/19/2021 17:51      IMPRESSION AND PLAN:  Active Problems:   COVID-19  1.   Multifocal pneumonia secondary to COVID-19 and possibly secondary to bacterial infection.The patient has subsequent sepsis as manifested by tachycardia and tachypnea and leukocytosis. -The patient will be admitted to an isolation monitored bed with droplet and contact precautions. -Given multifocal pneumonia we will empirically place the patient on IV Rocephin and Zithromax for possible bacterial superinfection especially given elevated Procalcitonin. -The patient will be placed on scheduled Mucinex and as needed Tussionex. -We will avoid nebulization as much as we can, give bronchodilator MDI if needed, and with deterioration of oxygenation try to avoid BiPAP/CPAP if possible.    -Will obtain sputum Gram stain culture and sensitivity and follow blood cultures. -O2 protocol will be followed. -We will follow CRP, ferritin, LDH and D-dimer. -Will follow manual differential for ANC/ALC ratio as well as follow troponin I and daily CBC with manual differential and CMP. - Will place the patient on IV  Remdesivir and IV steroid therapy with IV Solu-Medrol with elevated inflammatory markers. -The patient will be placed on vitamin D3, vitamin C, zinc sulfate, p.o. Pepcid and aspirin. -I discussed Baricitinib and the patient agreed to proceed with it.  2. Essential hypertension. -We will continue Norvasc and lisinopril.  3.  Type 2 diabetes mellitus. - We will continue Amaryl and place the patient on supplement coverage with NovoLog. - We will resume his Jardiance.  4.  History of castration resistant metastatic prostate cancer. - Pain management will be provided.  DVT prophylaxis: Lovenox. Code Status: full code. Family Communication:  The plan of care was discussed in details with the patient (and family). I answered all questions. The patient agreed to proceed with the above  mentioned plan. Further management will depend upon hospital course. Disposition Plan: Back to previous home environment Consults called: none. All the records are reviewed and case discussed with ED provider.  Status is: Inpatient  Remains inpatient appropriate because:Ongoing diagnostic testing needed not appropriate for outpatient work up, Unsafe d/c plan, IV treatments appropriate due to intensity of illness or inability to take PO, and Inpatient level of care appropriate due to severity of illness  Dispo: The patient is from: Home              Anticipated d/c is to: Home              Patient currently is not medically stable to d/c.   Difficult to place patient No      TOTAL TIME TAKING CARE OF THIS PATIENT: 55 minutes.    Jan A Mansy M.D on 03/19/2021 at 11:00 PM  [facility]   From 7 PM-7 AM, contact night-coverage [email or web address]  CC: Primary care physician; Koirala, [name], MD 

## 2021-03-19 NOTE — ED Notes (Signed)
Pt O2 sat > 95% while ambulating in room w/ stand-by assistance. C/o slight SOB.

## 2021-03-19 NOTE — ED Notes (Signed)
V3 EKG lead not working. Tech looking for new lead set.

## 2021-03-19 NOTE — ED Provider Notes (Signed)
[facility] DEPT Provider Note   CSN: [id number] Arrival date & time: 03/19/21  1530     History Chief Complaint  Patient presents with   Fever   Nausea   Weakness    Travis Bowman is a 60 y.o. male.   Fever Associated symptoms: cough   Associated symptoms: no chest pain, no chills, no dysuria, no ear pain, no rash, no sore throat and no vomiting   Weakness Associated symptoms: cough, fever and shortness of breath   Associated symptoms: no abdominal pain, no arthralgias, no chest pain, no dysuria, no seizures and no vomiting    60 year old male with a medical history significant for metastatic prostate cancer currently not receiving chemotherapy or radiation therapy, follows outpatient with hematology oncology, presenting from hematology oncology clinic with concerns for sepsis with fever and tachycardia.  The patient states that he was febrile at home to 101 with associated chills.  He also has had a productive cough.  Nothing made it better or worse.  No abdominal pain, nausea or vomiting or diarrhea.  No dysuria.  He was told to present to the emergency department due to his infectious symptoms.  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 May 2015--  renal function recovered   Hypertension    Malignant essential hypertension    Prostate cancer ([place]) dx'd 08/2015   Right knee meniscal tear     Type 2 diabetes mellitus ([place])    Wears contact lenses     Patient Active Problem List   Diagnosis Date Noted   COVID-19 03/19/2021   Goals of care, counseling/discussion 06/04/2020   Malignant neoplasm of prostate ([place]) 12/25/2015   S/P right knee arthroscopy 02/08/2015   GERD (gastroesophageal reflux disease) 09/01/2014   Proteinuria 12/06/2013   Hematuria 12/06/2013   Elevated brain natriuretic peptide (BNP) level 12/06/2013   Hypertensive urgency, malignant 12/05/2013   DM (diabetes mellitus) ([place]) 12/05/2013   Hypertension 11/03/2011   Nephrolithiasis 11/03/2011    Past Surgical History:  Procedure Laterality Date   CATARACT EXTRACTION W/ INTRAOCULAR LENS  IMPLANT, BILATERAL  2003/  2006   EXTRACORPOREAL SHOCK WAVE LITHOTRIPSY  x5  last one 2006   KNEE ARTHROSCOPY WITH MEDIAL MENISECTOMY Right 02/08/2015   Procedure: RIGHT KNEE ARTHROSCOPY, PARTIAL MEDIAL MENISCECTOMY, CHRONDROPLASTY;  Surgeon: Sydnee Cabal, MD;  Location: [facility];  Service: Orthopedics;  Laterality: Right;   LYMPHADENECTOMY Bilateral 01/28/2016   Procedure: PELVIC LYMPHADENECTOMY;  Surgeon: Raynelle Bring, MD;  Location: [facility];  Service: Urology;  Laterality: Bilateral;   ORCHIECTOMY Right age 46   PERCUTANEOUS NEPHROSTOLITHOTOMY  x2  last one 68's   [place] N/A 01/28/2016   Procedure: XI ROBOTIC ASSISTED LAPAROSCOPIC RADICAL PROSTATECTOMY LEVEL 2;  Surgeon: Raynelle Bring,  MD;  Location: [facility];  Service: Urology;  Laterality: N/A;   TONSILLECTOMY AND ADENOIDECTOMY  as child   TRANSTHORACIC ECHOCARDIOGRAM  12-06-2013   mild LVH,  ef 50-55%,  mild LAE and RAE,  mild dilated aortic root   TRIGGER FINGER RELEASE Bilateral last one 2013   x 2   URETEROLITHOTOMY  74's       Family History  Problem Relation Age of Onset   Stroke Mother    CAD Mother    Alcohol abuse Father    Diabetes Brother    Marfan syndrome Brother      Social History   Tobacco Use   Smoking status: Light Smoker    Packs/day: 0.00    Types: Cigars, Cigarettes   Smokeless tobacco: Never   Tobacco comments:    currently occasoinal cigar--  quit cigarettes in 2007 had smoked for 25 yrs  Substance Use Topics   Alcohol use: Yes    Alcohol/week: 2.0 standard drinks    Types: 2 Cans of beer per week    Comment: 2 beers week   Drug use: No    Home Medications Prior to Admission medications   Medication Sig Start Date End Date Taking? Authorizing Provider  amLODipine (NORVASC) 5 MG tablet Take 5 mg by mouth daily.   Yes [provider]  calcium-vitamin D (OSCAL WITH D) 500-200 MG-UNIT tablet Take 1 tablet by mouth 2 (two) times daily. 01/11/20  Yes Wyatt Portela, MD  diclofenac (VOLTAREN) 75 MG EC tablet Take 1 tablet (75 mg total) by mouth 2 (two) times daily as needed. 03/08/21  Yes Brunetta Genera, MD  empagliflozin (JARDIANCE) 25 MG TABS tablet Take 25 mg by mouth daily.   Yes [provider]  glimepiride (AMARYL) 2 MG tablet Take 2 mg by mouth daily with breakfast.   Yes [provider]  leuprolide (LUPRON DEPOT, 49-MONTH,) 30 MG injection See admin instructions.   Yes [provider]  lisinopril (PRINIVIL,ZESTRIL) 40 MG tablet Take 1 tablet by mouth every evening.  06/12/14  Yes [provider]  OYSCO 500 + D 500-200 MG-UNIT TABS TAKE 1 TABLET BY MOUTH TWICE DAILY 08/22/20  Yes Wyatt Portela, MD  Blood Glucose Monitoring Suppl (ACCU-CHEK GUIDE) w/Device KIT Use to test your blood sugar 11/25/18   [provider]  furosemide (LASIX) 20 MG tablet TAKE 1 TABLET(20 MG) BY MOUTH TWICE DAILY Patient not taking: Reported on 03/19/2021 11/05/20   Wyatt Portela, MD  Lancets Micro Thin 33G MISC Use to check your blood sugar 11/25/18   [provider]  LYNPARZA 150 MG tablet TAKE 2 TABLETS BY MOUTH  TWICE DAILY SWALLOW WHOLE.  MAY TAKE WITH FOOD TO  DECREASE NAUSEA AND   VOMITING 02/22/21   Wyatt Portela, MD  methocarbamol (ROBAXIN) 500 MG tablet Take 1 tablet (500 mg total) by mouth every 8 (eight) hours as needed for muscle spasms. Patient not taking: No sig reported 06/19/20   Sandi Mealy E., PA-C  NEULASTA 6 MG/0.6ML injection INJECT SUBCUTANEOUSLY 6MG  48 HOURS AFTER EACH CHEMO  EVERY 21 DAYS 10/25/20   Wyatt Portela, MD  prochlorperazine (COMPAZINE) 10 MG tablet Take 1 tablet (10 mg total) by mouth every 6 (six) hours as needed for nausea or vomiting. Patient not taking: Reported on 03/19/2021 06/04/20   Wyatt Portela, MD    Allergies    Morphine and related  Review of Systems   Review of Systems  Constitutional:  Positive for fever. Negative for chills.  HENT:  Negative for ear pain and sore throat.   Eyes:  Negative for pain and visual disturbance.  Respiratory:  Positive for cough and shortness of breath.   Cardiovascular:  Negative for chest pain and palpitations.  Gastrointestinal:  Negative for abdominal pain and vomiting.  Genitourinary:  Negative for dysuria and hematuria.  Musculoskeletal:  Negative for arthralgias and back pain.  Skin:  Negative for color change and rash.  Neurological:  Positive for weakness. Negative for seizures and syncope.  All other systems reviewed and are negative.  Physical Exam Updated Vital Signs BP 129/64   Pulse 82   Temp 98.5 F (36.9 C) (Oral)   Resp 18   SpO2 95%   Physical Exam Vitals and nursing note reviewed.  Constitutional:      General: He is not in acute distress. HENT:     Head: Normocephalic and atraumatic.  Eyes:     Conjunctiva/sclera: Conjunctivae normal.     Pupils: Pupils are equal, round, and reactive to light.  Cardiovascular:     Rate and Rhythm: Normal rate and regular rhythm.     Pulses: Normal pulses.  Pulmonary:     Effort: Pulmonary effort is normal. No respiratory distress.     Breath sounds: No wheezing, rhonchi or rales.  Abdominal:     General: There is no  distension.     Tenderness: There is no guarding.  Musculoskeletal:        General: No deformity or signs of injury.     Cervical back: Normal range of motion and neck supple.     Right lower leg: No edema.     Left lower leg: No edema.  Skin:    Findings: No lesion or rash.  Neurological:     General: No focal deficit present.     Mental Status: He is alert. Mental status is at baseline.    ED Results / Procedures / Treatments   Labs (all labs ordered are listed, but only abnormal results are displayed) Labs Reviewed  RESP PANEL BY RT-PCR (FLU A&B, COVID) ARPGX2 - Abnormal; Notable for the following components:      Result Value   SARS Coronavirus 2 by RT PCR POSITIVE (*)    All other components within normal limits  LACTIC ACID, PLASMA - Abnormal; Notable for the following components:   Lactic Acid, Venous 2.0 (*)    All other components within normal limits  COMPREHENSIVE METABOLIC PANEL - Abnormal; Notable for the following components:   BUN 21 (*)    Calcium 8.7 (*)    Albumin 2.2 (*)    AST 90 (*)    Alkaline Phosphatase 412 (*)    All other components within normal limits  CBC WITH DIFFERENTIAL/PLATELET - Abnormal; Notable for the following components:   WBC 20.4 (*)    RBC 2.96 (*)    Hemoglobin 8.8 (*)    HCT 28.3 (*)    RDW 20.0 (*)    Neutro Abs 17.6 (*)    Monocytes Absolute 1.7 (*)    Abs Immature Granulocytes 0.25 (*)    All other components within normal limits  URINALYSIS, ROUTINE W REFLEX MICROSCOPIC - Abnormal; Notable for the following components:   Hgb urine dipstick SMALL (*)    Protein, ur >300 (*)    All other components within normal limits  URINALYSIS, MICROSCOPIC (REFLEX) - Abnormal; Notable for the following components:   Bacteria, UA RARE (*)  All other components within normal limits  PROTIME-INR - Abnormal; Notable for the following components:   Prothrombin Time 15.7 (*)    INR 1.3 (*)    All other components within normal limits   BASIC METABOLIC PANEL - Abnormal; Notable for the following components:   Sodium 133 (*)    Calcium 8.4 (*)    All other components within normal limits  CBC - Abnormal; Notable for the following components:   WBC 16.5 (*)    RBC 2.83 (*)    Hemoglobin 8.5 (*)    HCT 26.9 (*)    RDW 19.9 (*)    All other components within normal limits  D-DIMER, QUANTITATIVE - Abnormal; Notable for the following components:   D-Dimer, Quant >20.00 (*)    All other components within normal limits  FERRITIN - Abnormal; Notable for the following components:   Ferritin 3,520 (*)    All other components within normal limits  C-REACTIVE PROTEIN - Abnormal; Notable for the following components:   CRP 40.0 (*)    All other components within normal limits  FIBRINOGEN - Abnormal; Notable for the following components:   Fibrinogen >800 (*)    All other components within normal limits  LACTATE DEHYDROGENASE - Abnormal; Notable for the following components:   LDH 938 (*)    All other components within normal limits  CBG MONITORING, ED - Abnormal; Notable for the following components:   Glucose-Capillary 118 (*)    All other components within normal limits  CBG MONITORING, ED - Abnormal; Notable for the following components:   Glucose-Capillary 146 (*)    All other components within normal limits  TROPONIN I (HIGH SENSITIVITY) - Abnormal; Notable for the following components:   Troponin I (High Sensitivity) 42 (*)    All other components within normal limits  TROPONIN I (HIGH SENSITIVITY) - Abnormal; Notable for the following components:   Troponin I (High Sensitivity) 42 (*)    All other components within normal limits  TROPONIN I (HIGH SENSITIVITY) - Abnormal; Notable for the following components:   Troponin I (High Sensitivity) 44 (*)    All other components within normal limits  CULTURE, BLOOD (ROUTINE X 2)  CULTURE, BLOOD (ROUTINE X 2)  MRSA NEXT GEN BY PCR, NASAL  URINE CULTURE  PROTIME-INR   LACTIC ACID, PLASMA  CORTISOL-AM, BLOOD  PROCALCITONIN  HEMOGLOBIN A1C  HIV ANTIBODY (ROUTINE TESTING W REFLEX)    EKG EKG Interpretation  Date/Time:  Tuesday March 19 2021 17:52:27 EDT Ventricular Rate:  90 PR Interval:  137 QRS Duration: 91 QT Interval:  360 QTC Calculation: 441 R Axis:   -21 Text Interpretation: Sinus rhythm Borderline left axis deviation Abnormal R-wave progression, late transition Borderline nonspecific T abnormalities, anterior leads Confirmed by Regan Lemming ([id number]) on 03/19/2021 10:47:32 PM  Radiology CT Chest W Contrast  Result Date: 03/19/2021 CLINICAL DATA:  Suspected pneumonia, effusion or abscess. EXAM: CT CHEST WITH CONTRAST TECHNIQUE: Multidetector CT imaging of the chest was performed during intravenous contrast administration. CONTRAST:  52m OMNIPAQUE IOHEXOL 350 MG/ML SOLN COMPARISON:  None. FINDINGS: Cardiovascular: No significant vascular findings. Normal heart size with marked severity coronary artery calcification. No pericardial effusion. Mediastinum/Nodes: There is moderate severity pretracheal, AP window, subcarinal and bilateral hilar lymphadenopathy. The largest lymph node is seen within the subcarinal region and measures approximately 2.6 cm x 2.1 cm. Thyroid gland, trachea, and esophagus demonstrate no significant findings. Lungs/Pleura: A 1.1 cm noncalcified lung nodule is seen within the lateral aspect of  the right upper lobe (axial CT image 35, CT series 7). An 8 mm noncalcified lung nodule is seen along the anterior aspect of the left lower lobe (axial CT image 57, CT series 7). A 7 mm noncalcified lung nodule is seen within the posterolateral aspect of the left lower lobe (axial CT image 90, CT series 7). This measures approximately 3 mm on the prior exam (image 46, CT series 2). An additional 7 mm noncalcified lung nodule is noted (axial CT image 92, CT series 7). Mild to moderate severity areas of atelectasis are seen within the  bilateral upper lobes, right middle lobe and right lower lobe. There is a small to moderate sized right-sided pleural effusion. No pneumothorax is identified. Upper Abdomen: Multiple heterogeneous low-attenuation liver lesions are seen. The largest measures approximately 5.6 cm x 5.3 cm and is seen within the left lobe (axial CT image 120, CT series 2). Musculoskeletal: Multiple sclerotic areas are seen throughout the thoracic and lumbar spine. IMPRESSION: 1. Moderate severity mediastinal and bilateral hilar lymphadenopathy, with multiple bilateral noncalcified lung nodules. Findings are concerning for metastatic disease. 2. Multiple heterogeneous low-attenuation liver lesions, concerning for metastatic disease. 3. Small to moderate sized right-sided pleural effusion. 4. Mild to moderate severity bilateral areas of atelectasis. 5. Findings likely consistent with osseous metastasis throughout the thoracic and lumbar spine. Electronically Signed   By: Virgina Norfolk M.D.   On: 03/19/2021 19:37   DG Chest Port 1 View  Result Date: 03/19/2021 CLINICAL DATA:  Shortness of breath. EXAM: PORTABLE CHEST 1 VIEW COMPARISON:  October 05, 2020 FINDINGS: Mildly increased interstitial lung markings are seen. Mild areas of superimposed atelectasis and/or early infiltrate are noted within the mid right lung and right lung base. There is no evidence of a pleural effusion or pneumothorax. The heart size and mediastinal contours are within normal limits. The visualized skeletal structures are unremarkable. IMPRESSION: Mild interstitial edema with mild areas of superimposed atelectasis and/or early infiltrate within the mid right lung and right lung base. Electronically Signed   By: Virgina Norfolk M.D.   On: 03/19/2021 17:51   VAS [place] LOWER EXTREMITY VENOUS (DVT)  Result Date: 03/20/2021  Lower Venous DVT Study Patient Name:  CLESTER CHLEBOWSKI [facility]  Date of Exam:   03/20/2021 Medical Rec #: [id number]       Accession #:    [id number]  Date of Birth: [birth year]       Patient Gender: M Patient Age:   17 years Exam Location:  [facility] Procedure:      VAS [place] LOWER EXTREMITY VENOUS (DVT) Referring Phys: Vance Gather --------------------------------------------------------------------------------  Indications: Elevated Ddimer.  Risk Factors: COVID 19 positive Cancer. Limitations: Poor ultrasound/tissue interface and body habitus. Comparison Study: No prior studies. Performing Technologist: Oliver Hum RVT  Examination Guidelines: A complete evaluation includes B-mode imaging, spectral Doppler, color Doppler, and power Doppler as needed of all accessible portions of each vessel. Bilateral testing is considered an integral part of a complete examination. Limited examinations for reoccurring indications may be performed as noted. The reflux portion of the exam is performed with the patient in reverse Trendelenburg.  +---------+---------------+---------+-----------+----------+--------------+ RIGHT    CompressibilityPhasicitySpontaneityPropertiesThrombus Aging +---------+---------------+---------+-----------+----------+--------------+ CFV      Full           Yes      Yes                                 +---------+---------------+---------+-----------+----------+--------------+  SFJ      Full                                                        +---------+---------------+---------+-----------+----------+--------------+ FV Prox  Full                                                        +---------+---------------+---------+-----------+----------+--------------+ FV Mid   Full                                                        +---------+---------------+---------+-----------+----------+--------------+ FV DistalFull                                                        +---------+---------------+---------+-----------+----------+--------------+ PFV      Full                                                         +---------+---------------+---------+-----------+----------+--------------+ POP      Full           Yes      Yes                                 +---------+---------------+---------+-----------+----------+--------------+ PTV      Full                                                        +---------+---------------+---------+-----------+----------+--------------+ PERO     Full                                                        +---------+---------------+---------+-----------+----------+--------------+   +---------+---------------+---------+-----------+----------+-------------------+ LEFT     CompressibilityPhasicitySpontaneityPropertiesThrombus Aging      +---------+---------------+---------+-----------+----------+-------------------+ CFV      Full           Yes      Yes                                      +---------+---------------+---------+-----------+----------+-------------------+ SFJ      Full                                                             +---------+---------------+---------+-----------+----------+-------------------+  FV Prox                 Yes      Yes                                      +---------+---------------+---------+-----------+----------+-------------------+ FV Mid                  Yes      Yes                                      +---------+---------------+---------+-----------+----------+-------------------+ FV Distal               Yes      Yes                                      +---------+---------------+---------+-----------+----------+-------------------+ PFV                                                   Not well visualized +---------+---------------+---------+-----------+----------+-------------------+ POP      Full           Yes      Yes                                      +---------+---------------+---------+-----------+----------+-------------------+ PTV      Full                                                              +---------+---------------+---------+-----------+----------+-------------------+ PERO                                                  Not well visualized +---------+---------------+---------+-----------+----------+-------------------+    Summary: RIGHT: - There is no evidence of deep vein thrombosis in the lower extremity.  - No cystic structure found in the popliteal fossa.  LEFT: - There is no evidence of deep vein thrombosis in the lower extremity. However, portions of this examination were limited- see technologist comments above.  - No cystic structure found in the popliteal fossa.  *See table(s) above for measurements and observations.    Preliminary     Procedures Procedures   Medications Ordered in ED Medications  methocarbamol (ROBAXIN) tablet 500 mg (has no administration in time range)  prochlorperazine (COMPAZINE) tablet 10 mg (has no administration in time range)  diclofenac (VOLTAREN) EC tablet 75 mg (has no administration in time range)  amLODipine (NORVASC) tablet 5 mg (5 mg Oral Given 03/20/21 0916)  lisinopril (ZESTRIL) tablet 40 mg (has no administration in time range)  empagliflozin (JARDIANCE) tablet 25 mg (25 mg Oral Given 03/20/21 0915)  calcium-vitamin D (OSCAL WITH D) 500-200 MG-UNIT per tablet  1 tablet (1 tablet Oral Given 03/20/21 0915)  0.9 %  sodium chloride infusion ( Intravenous New Bag/Given 03/20/21 0328)  cefTRIAXone (ROCEPHIN) 2 g in sodium chloride 0.9 % 100 mL IVPB (0 g Intravenous Stopped 03/20/21 0427)  azithromycin (ZITHROMAX) 500 mg in sodium chloride 0.9 % 250 mL IVPB (0 mg Intravenous Stopped 03/20/21 0543)  acetaminophen (TYLENOL) tablet 650 mg (has no administration in time range)    Or  acetaminophen (TYLENOL) suppository 650 mg (has no administration in time range)  traZODone (DESYREL) tablet 25 mg (has no administration in time range)  magnesium hydroxide (MILK OF MAGNESIA) suspension 30  mL (has no administration in time range)  ondansetron (ZOFRAN) tablet 4 mg (has no administration in time range)    Or  ondansetron (ZOFRAN) injection 4 mg (has no administration in time range)  guaiFENesin (MUCINEX) 12 hr tablet 600 mg (600 mg Oral Given 03/20/21 0916)  guaiFENesin-dextromethorphan (ROBITUSSIN DM) 100-10 MG/5ML syrup 10 mL (has no administration in time range)  chlorpheniramine-HYDROcodone (TUSSIONEX) 10-8 MG/5ML suspension 5 mL (has no administration in time range)  cholecalciferol (VITAMIN D3) tablet 400 Units (400 Units Oral Given 03/20/21 0915)  insulin aspart (novoLOG) injection 0-15 Units (0 Units Subcutaneous Not Given 03/20/21 1230)  Ipratropium-Albuterol (COMBIVENT) respimat 1 puff (1 puff Inhalation Patient Refused/Not Given 03/20/21 1231)  enoxaparin (LOVENOX) injection 100 mg (100 mg Subcutaneous Given 03/20/21 0956)  lactated ringers bolus 1,000 mL (0 mLs Intravenous Stopped 03/19/21 2208)  ceFEPIme (MAXIPIME) 2 g in sodium chloride 0.9 % 100 mL IVPB (0 g Intravenous Stopped 03/19/21 1932)  vancomycin (VANCOREADY) IVPB 2000 mg/400 mL (0 mg Intravenous Stopped 03/19/21 2132)  iohexol (OMNIPAQUE) 350 MG/ML injection 60 mL (60 mLs Intravenous Contrast Given 03/19/21 1848)  oxyCODONE (Oxy IR/ROXICODONE) immediate release tablet 5 mg (5 mg Oral Given 03/19/21 2303)  remdesivir 100 mg in sodium chloride 0.9 % 100 mL IVPB (0 mg Intravenous Stopped 03/20/21 0426)    Followed by  remdesivir 100 mg in sodium chloride 0.9 % 100 mL IVPB (0 mg Intravenous Stopped 03/20/21 0426)    ED Course  I have reviewed the triage vital signs and the nursing notes.  Pertinent labs & imaging results that were available during my care of the patient were reviewed by me and considered in my medical decision making (see chart for details).    MDM Rules/Calculators/A&P                           60 year old male with medical history as above to include metastatic prostate cancer presenting with  fever and tachycardia from his hematology oncology clinic earlier today.  The patient has had 1 day of cough, shortness of breath, fever and chills.  Differential diagnosis includes COVID-19, bacterial pneumonia, pleural effusion.  On arrival, the patient was afebrile, hemodynamically stable, mildly tachypneic, saturating well on room air.  Concern for bacterial infection and sepsis given his fever and tachycardia in clinic so he was administered broad-spectrum IV antibiotics and IV fluids on arrival.  COVID-19 and influenza testing was collected and ultimately resulted positive for COVID-19.  CT of the chest was performed which revealed moderate severity mediastinal and bilateral hilar lymphadenopathy with multiple bilateral noncalcified lung nodules concerning for metastatic disease, multiple heterogenous low-attenuation liver lesions concerning for metastatic disease, small to moderate sized right-sided pleural effusions.  The patient previously has been positive for COVID-19 3 months ago but cleared the virus and tested negative.  This is  likely a new infection.  Additionally, the patient CBC raises concern for superimposed bacterial pneumonia.  The patient was ambulated in the emergency department with no desaturations on ambulation, however the patient was very fatigued and became tachypneic on ambulation.  Given his metastatic cancer, new COVID-19 diagnosis and concern for superimposed bacterial pneumonia, hospitalist medicine was consulted for admission.   Final Clinical Impression(s) / ED Diagnoses Final diagnoses:  Fever, unspecified fever cause  Leukocytosis, unspecified type  COVID-19  Pleural effusion    Rx / DC Orders ED Discharge Orders     None        Regan Lemming, MD 03/20/21 1242

## 2021-03-19 NOTE — ED Notes (Signed)
Pt was able to ambulate to bathroom and back to room without assistance. Pt required some assistance getting to a standing position.

## 2021-03-19 NOTE — Telephone Encounter (Signed)
2nd PC to ED Charge Tuttle, report given, patient to go to room 17.

## 2021-03-19 NOTE — Progress Notes (Signed)
A consult was received from an ED physician for vancomycin per pharmacy dosing.  The patient's profile has been reviewed for ht/wt/allergies/indication/available labs.   A one time order has been placed for vancomycin 2000mg  IV x1.  Further antibiotics/pharmacy consults should be ordered by admitting physician if indicated.                       Thank you, Dimple Nanas, PharmD 03/19/2021 6:02 PM

## 2021-03-19 NOTE — Progress Notes (Signed)
Hematology and Oncology Follow Up Visit  EULIS SALAZAR [id number] [birth year] 60 y.o. 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  He had presented with localized disease in 2017.   Prior Therapy:   He is status post robotic-assisted laparoscopic radical prostatectomy and bilateral extended pelvic lymphadenectomy on 01/28/2016. The right posterior soft tissue margins were positive and 2 out of 12 lymph nodes on the right side involved with cancer. 0 out of 10 lymph nodes in the left side had any cancer involvement.  He is status post radiation therapy between November 2017 in January 2018.  He had 45 Gy to the prostate and pelvic lymph nodes with additional 23.4 Gy prostatic fossa boost.  He also completed 6 months of androgen deprivation.  His PSA remained undetectable until April 2019 which was 1.38.  He was started on androgen deprivation at that time.  He developed castration-resistant disease in January 2021 after increase in his PSA with a bone scan showed limited bone disease involvement.   Xtandi 160 mg daily started in January 2021.  Therapy discontinued in December 2021 for progression of disease.  Taxotere chemotherapy given 75 mg per metered square every 3 weeks started on June 13, 2020.   He completed 9 cycles of therapy on November 28, 2020.  Lynparza 300 mg twice daily to start on January 08, 2021.  Current therapy:   Eligard 30 mg every 4 months.  Next injection will be tentatively in November 2022.  Under evaluation to start salvage therapy.  Interim History: Mr. Dillenbeck returns today for a follow-up visit.  He has reported in the last week rather rapid decline in his health.  He has reported more shortness of breath, weakness and nonproductive cough.  He has noted failure to thrive and inability to eat at this  time.    .  Medications: Reviewed without changes. Current Outpatient Medications  Medication Sig Dispense Refill   amLODipine (NORVASC) 5 MG tablet Take 5 mg by mouth daily.     Blood Glucose Monitoring Suppl (ACCU-CHEK GUIDE) w/Device KIT Use to test your blood sugar     calcium-vitamin D (OSCAL WITH D) 500-200 MG-UNIT tablet Take 1 tablet by mouth 2 (two) times daily. 90 tablet 3   diclofenac (VOLTAREN) 75 MG EC tablet Take 1 tablet (75 mg total) by mouth 2 (two) times daily as needed. 60 tablet 0   empagliflozin (JARDIANCE) 25 MG TABS tablet Take 25 mg by mouth daily.     furosemide (LASIX) 20 MG tablet TAKE 1 TABLET(20 MG) BY MOUTH TWICE DAILY 60 tablet 3   glimepiride (AMARYL) 2 MG tablet Take 2 mg by mouth daily with breakfast.     Lancets Micro Thin 33G MISC Use to check your blood sugar     leuprolide (LUPRON DEPOT, 33-MONTH,) 30 MG injection See admin instructions.     lisinopril (PRINIVIL,ZESTRIL) 40 MG tablet Take 1 tablet by mouth every evening.   3   LYNPARZA 150 MG tablet TAKE 2 TABLETS BY MOUTH  TWICE DAILY SWALLOW WHOLE.  MAY TAKE WITH FOOD TO  DECREASE NAUSEA AND  VOMITING 120 tablet 1   methocarbamol (ROBAXIN) 500 MG tablet Take 1 tablet (500 mg total) by mouth every 8 (eight) hours as needed for muscle spasms. 30 tablet 0   NEULASTA 6 MG/0.6ML injection INJECT SUBCUTANEOUSLY 6MG  [place]  CHEMO  EVERY 21 DAYS 0.6 mL 3   OYSCO 500 + D 500-200 MG-UNIT TABS TAKE 1 TABLET BY MOUTH TWICE DAILY 90 tablet 6   prochlorperazine (COMPAZINE) 10 MG tablet Take 1 tablet (10 mg total) by mouth every 6 (six) hours as needed for nausea or vomiting. 30 tablet 0   No current facility-administered medications for this visit.     Allergies:  Allergies  Allergen Reactions   Morphine And Related Nausea And Vomiting      Physical Exam:     Blood pressure 139/68, pulse (!) 102, temperature (!) 100.4 F (38 C), temperature source Oral, resp. rate 18, weight 222 lb 6 oz  (100.9 kg), SpO2 98 %.        ECOG: 2   General appearance: Uncomfortable appearing and chronically ill. Head: Normocephalic without any trauma Oropharynx: Mucous membranes are moist and pink without any thrush or ulcers. Eyes: Pupils are equal and round reactive to light. Lymph nodes: No cervical, supraclavicular, inguinal or axillary lymphadenopathy.   Heart:regular rate and rhythm.  S1 and S2 without leg edema. Lung: Decreased breath sounds on the right with scattered rhonchi and wheezes. Abdomin: Soft, nontender, nondistended with good bowel sounds.  No hepatosplenomegaly. Musculoskeletal: No joint deformity or effusion.  Full range of motion noted. Neurological: No deficits noted on motor, sensory and deep tendon reflex exam. Skin: No petechial rash or dryness.  Appeared moist.               Lab Results: Lab Results  Component Value Date   WBC 20.3 (H) 03/19/2021   HGB 9.0 (L) 03/19/2021   HCT 28.0 (L) 03/19/2021   MCV 92.1 03/19/2021   PLT 395 03/19/2021     Chemistry      Component Value Date/Time   NA 140 02/26/2021 1042   K 3.8 02/26/2021 1042   CL 108 02/26/2021 1042   CO2 22 02/26/2021 1042   BUN 23 (H) 02/26/2021 1042   CREATININE 0.99 02/26/2021 1042      Component Value Date/Time   CALCIUM 9.2 02/26/2021 1042   ALKPHOS 262 (H) 02/26/2021 1042   AST 48 (H) 02/26/2021 1042   ALT 23 02/26/2021 1042   BILITOT 0.3 02/26/2021 1042                    Impression and Plan:   60 year old man with:   1.  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    His disease status was updated at this time and treatment choices were reviewed.  He is scheduled to start lutetium 117 but his performance status is declining rather rapidly.  This could be sign of disease progression could be other factors such as an infection or pleural effusion.  He understands that his disease  has progressed rather rapidly and if no reversible factors are detected, we are looking at potentially end-of-life situation and transitioning to hospice any a better option.   2.  Shortness of breath and fever: He requires an urgent evaluation given his symptoms.  This could be related to pneumonia, increased pleural effusion or pulmonary embolism.  We will urgently refer him to the emergency department for an evaluation.   3.  Goals of care: His performance status has declined rapidly.  This could be related to disease progression versus a other reversible process.  He understands if all treatable conditions have been addressed and continues to decline transitioning to hospice would  be reasonable.   4.  Follow-up: We will be in the near future pending his evaluation in the emergency department and possible hospitalizations.  30  minutes were dedicated to this encounter.  Time was spent on reviewing laboratory data, disease status update, management choices and future plan of care review.    Zola Button, MD 9/20/20222:08 PM

## 2021-03-19 NOTE — ED Notes (Signed)
Patient transported to CT 

## 2021-03-19 NOTE — ED Notes (Signed)
Pt given a diet cola and crackers.

## 2021-03-19 NOTE — ED Triage Notes (Signed)
Pt arrives from [facility] center with c/o fever, N/V, weakness x1 week. Pt has hx of prostate CA and mets to bone. Temp at [facility] center was 100.4 and HR was 102. In triage, temp 98.5.

## 2021-03-20 ENCOUNTER — Inpatient Hospital Stay (HOSPITAL_COMMUNITY)
Admit: 2021-03-20 | Discharge: 2021-03-20 | Disposition: A | Discharge status: Home / Self Care | Payer: 59 | Attending: Family Medicine | Admitting: Family Medicine

## 2021-03-20 DIAGNOSIS — J189 Pneumonia, unspecified organism: Secondary | ICD-10-CM | POA: Diagnosis not present

## 2021-03-20 DIAGNOSIS — R7989 Other specified abnormal findings of blood chemistry: Secondary | ICD-10-CM

## 2021-03-20 DIAGNOSIS — A419 Sepsis, unspecified organism: Secondary | ICD-10-CM | POA: Diagnosis not present

## 2021-03-20 LAB — CBC
HCT: 26.9 % — ABNORMAL LOW (ref 39.0–52.0)
Hemoglobin: 8.5 g/dL — ABNORMAL LOW (ref 13.0–17.0)
MCH: 30 pg (ref 26.0–34.0)
MCHC: 31.6 g/dL (ref 30.0–36.0)
MCV: 95.1 fL (ref 80.0–100.0)
Platelets: 357 10*3/uL (ref 150–400)
RBC: 2.83 MIL/uL — ABNORMAL LOW (ref 4.22–5.81)
RDW: 19.9 % — ABNORMAL HIGH (ref 11.5–15.5)
WBC: 16.5 10*3/uL — ABNORMAL HIGH (ref 4.0–10.5)
nRBC: 0 % (ref 0.0–0.2)

## 2021-03-20 LAB — FERRITIN: Ferritin: 3520 ng/mL — ABNORMAL HIGH (ref 24–336)

## 2021-03-20 LAB — BASIC METABOLIC PANEL
Anion gap: 10 (ref 5–15)
BUN: 18 mg/dL (ref 6–20)
CO2: 23 mmol/L (ref 22–32)
Calcium: 8.4 mg/dL — ABNORMAL LOW (ref 8.9–10.3)
Chloride: 100 mmol/L (ref 98–111)
Creatinine, Ser: 0.78 mg/dL (ref 0.61–1.24)
GFR, Estimated: >60 mL/min (ref >60)
Glucose, Bld: 97 mg/dL (ref 70–99)
Potassium: 4.1 mmol/L (ref 3.5–5.1)
Sodium: 133 mmol/L — ABNORMAL LOW (ref 135–145)

## 2021-03-20 LAB — CBG MONITORING, ED
Glucose-Capillary: 118 mg/dL — ABNORMAL HIGH (ref 70–99)
Glucose-Capillary: 146 mg/dL — ABNORMAL HIGH (ref 70–99)

## 2021-03-20 LAB — CORTISOL-AM, BLOOD: Cortisol - AM: 22.3 ug/dL (ref 6.7–22.6)

## 2021-03-20 LAB — FIBRINOGEN: Fibrinogen: >800 mg/dL — ABNORMAL HIGH (ref 210–475)

## 2021-03-20 LAB — D-DIMER, QUANTITATIVE: D-Dimer, Quant: >20 ug/mL-FEU — ABNORMAL HIGH (ref 0.00–0.50)

## 2021-03-20 LAB — HEMOGLOBIN A1C
Hgb A1c MFr Bld: 5.4 % (ref 4.8–5.6)
Mean Plasma Glucose: 108.28 mg/dL

## 2021-03-20 LAB — PROSTATE-SPECIFIC AG, SERUM (LABCORP): Prostate Specific Ag, Serum: 1258 ng/mL — ABNORMAL HIGH (ref 0.0–4.0)

## 2021-03-20 LAB — C-REACTIVE PROTEIN: CRP: 40 mg/dL — ABNORMAL HIGH (ref <1.0)

## 2021-03-20 LAB — PROTIME-INR
INR: 1.3 — ABNORMAL HIGH (ref 0.8–1.2)
Prothrombin Time: 15.7 seconds — ABNORMAL HIGH (ref 11.4–15.2)

## 2021-03-20 LAB — TROPONIN I (HIGH SENSITIVITY)
Troponin I (High Sensitivity): 42 ng/L — ABNORMAL HIGH (ref <18)
Troponin I (High Sensitivity): 42 ng/L — ABNORMAL HIGH (ref <18)
Troponin I (High Sensitivity): 44 ng/L — ABNORMAL HIGH (ref <18)

## 2021-03-20 LAB — PROCALCITONIN: Procalcitonin: 2.59 ng/mL

## 2021-03-20 LAB — LACTATE DEHYDROGENASE: LDH: 938 U/L — ABNORMAL HIGH (ref 98–192)

## 2021-03-20 LAB — GLUCOSE, CAPILLARY
Glucose-Capillary: 97 mg/dL (ref 70–99)
Glucose-Capillary: 122 mg/dL — ABNORMAL HIGH (ref 70–99)

## 2021-03-20 LAB — HIV ANTIBODY (ROUTINE TESTING W REFLEX): HIV Screen 4th Generation wRfx: NONREACTIVE

## 2021-03-20 MED ORDER — ZINC SULFATE 220 (50 ZN) MG PO CAPS
220.0000 mg | ORAL_CAPSULE | Freq: Every day | ORAL | Status: DC
  Administered 2021-03-20: 220 mg via ORAL
  Filled 2021-03-20: qty 1

## 2021-03-20 MED ORDER — INSULIN ASPART 100 UNIT/ML IJ SOLN
0.0000 [IU] | Freq: Three times a day (TID) | INTRAMUSCULAR | Status: DC
  Filled 2021-03-20: qty 0.15

## 2021-03-20 MED ORDER — GUAIFENESIN-DM 100-10 MG/5ML PO SYRP: 10.0000 mL | ORAL_SOLUTION | ORAL | Status: DC | PRN

## 2021-03-20 MED ORDER — ENOXAPARIN SODIUM 100 MG/ML IJ SOSY
1.0000 mg/kg | PREFILLED_SYRINGE | Freq: Two times a day (BID) | INTRAMUSCULAR | Status: DC
  Administered 2021-03-20 – 2021-03-21 (×3): 100 mg via SUBCUTANEOUS
  Filled 2021-03-20 (×3): qty 1

## 2021-03-20 MED ORDER — SODIUM CHLORIDE 0.9 % IV SOLN
500.0000 mg | INTRAVENOUS | Status: DC
  Administered 2021-03-20 – 2021-03-22 (×3): 500 mg via INTRAVENOUS
  Filled 2021-03-20 (×3): qty 500

## 2021-03-20 MED ORDER — ONDANSETRON HCL 4 MG PO TABS: 4.0000 mg | ORAL_TABLET | Freq: Four times a day (QID) | ORAL | Status: DC | PRN

## 2021-03-20 MED ORDER — SODIUM CHLORIDE 0.9 % IV SOLN: 100.0000 mg | Freq: Every day | INTRAVENOUS | Status: DC

## 2021-03-20 MED ORDER — SODIUM CHLORIDE 0.9 % IV SOLN
100.0000 mg | Freq: Once | INTRAVENOUS | Status: AC
  Administered 2021-03-20: 100 mg via INTRAVENOUS

## 2021-03-20 MED ORDER — ACETAMINOPHEN 650 MG RE SUPP: 650.0000 mg | Freq: Four times a day (QID) | RECTAL | Status: DC | PRN

## 2021-03-20 MED ORDER — IPRATROPIUM-ALBUTEROL 0.5-2.5 (3) MG/3ML IN SOLN: 3.0000 mL | Freq: Four times a day (QID) | RESPIRATORY_TRACT | Status: DC

## 2021-03-20 MED ORDER — IPRATROPIUM-ALBUTEROL 20-100 MCG/ACT IN AERS: 1.0000 | INHALATION_SPRAY | Freq: Four times a day (QID) | RESPIRATORY_TRACT | Status: DC | PRN

## 2021-03-20 MED ORDER — METHYLPREDNISOLONE SODIUM SUCC 125 MG IJ SOLR
0.5000 mg/kg | Freq: Two times a day (BID) | INTRAMUSCULAR | Status: DC
Start: 2021-03-20 — End: 2021-03-20
  Administered 2021-03-20: 50.625 mg via INTRAVENOUS
  Filled 2021-03-20: qty 2

## 2021-03-20 MED ORDER — SODIUM CHLORIDE 0.9 % IV SOLN: 200.0000 mg | Freq: Once | INTRAVENOUS | Status: DC

## 2021-03-20 MED ORDER — HYDROCOD POLST-CPM POLST ER 10-8 MG/5ML PO SUER: 5.0000 mL | Freq: Two times a day (BID) | ORAL | Status: DC | PRN

## 2021-03-20 MED ORDER — ENOXAPARIN SODIUM 40 MG/0.4ML IJ SOSY: 40.0000 mg | PREFILLED_SYRINGE | INTRAMUSCULAR | Status: DC

## 2021-03-20 MED ORDER — CHOLECALCIFEROL 10 MCG (400 UNIT) PO TABS
400.0000 [IU] | ORAL_TABLET | Freq: Two times a day (BID) | ORAL | Status: DC
  Administered 2021-03-20 – 2021-03-22 (×5): 400 [IU] via ORAL
  Filled 2021-03-20 (×6): qty 1

## 2021-03-20 MED ORDER — TRAZODONE HCL 50 MG PO TABS: 25.0000 mg | ORAL_TABLET | Freq: Every evening | ORAL | Status: DC | PRN

## 2021-03-20 MED ORDER — ASCORBIC ACID 500 MG PO TABS
500.0000 mg | ORAL_TABLET | Freq: Every day | ORAL | Status: DC
  Administered 2021-03-20: 500 mg via ORAL
  Filled 2021-03-20: qty 1

## 2021-03-20 MED ORDER — MAGNESIUM HYDROXIDE 400 MG/5ML PO SUSP: 30.0000 mL | Freq: Every day | ORAL | Status: DC | PRN

## 2021-03-20 MED ORDER — ONDANSETRON HCL 4 MG/2ML IJ SOLN: 4.0000 mg | Freq: Four times a day (QID) | INTRAMUSCULAR | Status: DC | PRN

## 2021-03-20 MED ORDER — EMPAGLIFLOZIN 25 MG PO TABS
25.0000 mg | ORAL_TABLET | Freq: Every day | ORAL | Status: DC
  Administered 2021-03-20 – 2021-03-22 (×3): 25 mg via ORAL
  Filled 2021-03-20 (×3): qty 1

## 2021-03-20 MED ORDER — CALCIUM CARB-CHOLECALCIFEROL 500-200 MG-UNIT PO TABS: 1.0000 | ORAL_TABLET | Freq: Two times a day (BID) | ORAL | Status: DC

## 2021-03-20 MED ORDER — CEFTRIAXONE SODIUM 2 G IJ SOLR
2.0000 g | INTRAMUSCULAR | Status: DC
Start: 2021-03-20 — End: 2021-03-22
  Administered 2021-03-20 – 2021-03-22 (×3): 2 g via INTRAVENOUS
  Filled 2021-03-20 (×3): qty 20

## 2021-03-20 MED ORDER — PROCHLORPERAZINE MALEATE 10 MG PO TABS: 10.0000 mg | ORAL_TABLET | Freq: Four times a day (QID) | ORAL | Status: DC | PRN

## 2021-03-20 MED ORDER — LISINOPRIL 20 MG PO TABS
40.0000 mg | ORAL_TABLET | Freq: Every evening | ORAL | Status: DC
  Administered 2021-03-20 – 2021-03-21 (×2): 40 mg via ORAL
  Filled 2021-03-20 (×2): qty 2

## 2021-03-20 MED ORDER — GLIMEPIRIDE 2 MG PO TABS
2.0000 mg | ORAL_TABLET | Freq: Every day | ORAL | Status: DC
  Administered 2021-03-20: 2 mg via ORAL
  Filled 2021-03-20: qty 1

## 2021-03-20 MED ORDER — ACETAMINOPHEN 325 MG PO TABS
650.0000 mg | ORAL_TABLET | Freq: Four times a day (QID) | ORAL | Status: DC | PRN
  Administered 2021-03-21: 650 mg via ORAL
  Filled 2021-03-20 (×2): qty 2

## 2021-03-20 MED ORDER — AMLODIPINE BESYLATE 5 MG PO TABS
5.0000 mg | ORAL_TABLET | Freq: Every day | ORAL | Status: DC
  Administered 2021-03-20 – 2021-03-22 (×3): 5 mg via ORAL
  Filled 2021-03-20 (×3): qty 1

## 2021-03-20 MED ORDER — IPRATROPIUM-ALBUTEROL 20-100 MCG/ACT IN AERS
1.0000 | INHALATION_SPRAY | Freq: Four times a day (QID) | RESPIRATORY_TRACT | Status: DC
  Administered 2021-03-20: 1 via RESPIRATORY_TRACT
  Filled 2021-03-20: qty 4

## 2021-03-20 MED ORDER — DICLOFENAC SODIUM 75 MG PO TBEC
75.0000 mg | DELAYED_RELEASE_TABLET | Freq: Two times a day (BID) | ORAL | Status: DC | PRN
  Filled 2021-03-20: qty 1

## 2021-03-20 MED ORDER — GUAIFENESIN ER 600 MG PO TB12
600.0000 mg | ORAL_TABLET | Freq: Two times a day (BID) | ORAL | Status: DC
  Administered 2021-03-20 – 2021-03-22 (×6): 600 mg via ORAL
  Filled 2021-03-20 (×6): qty 1

## 2021-03-20 MED ORDER — PREDNISONE 50 MG PO TABS: 50.0000 mg | ORAL_TABLET | Freq: Every day | ORAL | Status: DC

## 2021-03-20 MED ORDER — METHYLPREDNISOLONE SODIUM SUCC 125 MG IJ SOLR: 60.0000 mg | Freq: Two times a day (BID) | INTRAMUSCULAR | Status: DC

## 2021-03-20 MED ORDER — METHYLPREDNISOLONE SODIUM SUCC 125 MG IJ SOLR: 120.0000 mg | INTRAMUSCULAR | Status: DC

## 2021-03-20 MED ORDER — SODIUM CHLORIDE 0.9 % IV SOLN: INTRAVENOUS | Status: DC

## 2021-03-20 MED ORDER — CALCIUM CARBONATE-VITAMIN D 500-200 MG-UNIT PO TABS
1.0000 | ORAL_TABLET | Freq: Two times a day (BID) | ORAL | Status: DC
  Administered 2021-03-20 – 2021-03-22 (×5): 1 via ORAL
  Filled 2021-03-20 (×5): qty 1

## 2021-03-20 MED ORDER — METHOCARBAMOL 500 MG PO TABS
500.0000 mg | ORAL_TABLET | Freq: Three times a day (TID) | ORAL | Status: DC | PRN
  Administered 2021-03-21: 500 mg via ORAL
  Filled 2021-03-20: qty 1

## 2021-03-20 NOTE — Progress Notes (Signed)
Pt refused nocturnal cpap tonight.  Pt was advised that RT is available all night should he change his mind.  RN aware.  RT will assist as needed.

## 2021-03-20 NOTE — Progress Notes (Signed)
Bilateral lower extremity venous duplex has been completed. Preliminary results can be found in CV Proc through chart review.   03/20/21 11:57 AM Travis Bowman RVT

## 2021-03-20 NOTE — Plan of Care (Signed)

## 2021-03-20 NOTE — Progress Notes (Signed)
PROGRESS NOTE  [name]  GYI:[id number] DOB: April 13, 1961 DOA: 03/19/2021 PCP: Lujean Amel, MD  Outpatient Specialists: Oncology, Dr. Alen Blew Brief Narrative: Travis Bowman is a 60 y.o. male with a history of castration-resistant advanced prostate cancer with metastatic lymph node and bone involvement who presented to oncology clinic 9/20 and was subsequently referred to ED due to fever and tachycardia in setting of productive cough and chills  Assessment & Plan: Active Problems:   COVID-19  Sepsis due to multifocal pneumonia: With elevated PCT, neutrophilic leukocytosis and abrupt improvement with antibiotics, as well as prior hx recent covid and elevated CT value, suspect bacterial pneumonia is primary driver of presentation. Also had fever, tachypnea, and sinus tachycardia on presentation. MRSA PCR negative.  - Continue ceftriaxone, azithromycin.  - Monitor blood and urine cultures - Will monitor CBC, though solumedrol was given, so leukocytosis will be unreliable.   Elevated d-dimer: High risk for VTE.  - Check LE venous U/S, LLE swelling increasing over past couple weeks.  - If negative, would be forced to perform dedicated CTA chest (only CT chest w/contrast performed in ED).  - Empiric therapeutic lovenox is ordered.   History of covid-19 infection: July 2022 diagnosed with home test, given paxlovid by PCP at that time with resolution of symptoms (chills only).  - Tested positive by PCR here, though CT is 38.8 and is within 90 days of positive testing. We will, therefore, discontinue isolation and covid-directed therapies.   Advanced prostate CA: Dx 2021 s/p radical prostatectomy, bilateral pelvic lymphadenectomy July 2021, then radiation Nov 2017 - Jan 2018 to prostate, pelvic LNs, prostatic fossa, and 6 months androgen deprivation. Bony involvement Dx Jan 2021, started Xtandi 160mg  daily, stopped Dec 2021 due to disease progression. Then taxotere chemo x9 cycles thru June  2022.  - Dr. Alen Blew aware of hospitalization. Appears to be improving with Tx, so aggressive interventions continued.  T2DM: HbA1c 5.4% - Continue jardiance. 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   Anemia of malignancy/chronic disease: Hgb near recent baseline over past month. No bleeding reported.  Demand myocardial ischemia: No chest pain or ischemic ECG changes to suggests troponin elevation which is mild is due to ACS.   Obesity: Estimated body mass index is 33.81 kg/m as calculated from the following:   Height as of 03/01/21: 5\' 8"  (1.727 m).   Weight as of an earlier encounter on 03/19/21: 100.9 kg.  DVT prophylaxis: Lovenox 1mg /kg q12h Code Status: Full Family Communication: Wife at bedside Disposition Plan:  Status is: Inpatient  Remains inpatient appropriate because:Inpatient level of care appropriate due to severity of illness  Dispo: The patient is from: Home              Anticipated d/c is to: Home              Patient currently is not medically stable to d/c.  Consultants:  Oncology aware of patient  Procedures:  None  Antimicrobials: Vancomycin, cefepime 9/20 Ceftriaxone, azithromycin 9/20 >> Remdesivir 9/20 x1   Subjective: Breathing is much better and has no fever or chills. He was surprised when they said he had covid because he was treated for covid in July. No wheezing or chest pain. Wants to go home soon.  Objective: Vitals:   03/20/21 0430 03/20/21 0500 03/20/21 0605 03/20/21 0914  BP: (!) 147/70 132/67 (!) 143/67 139/70  Pulse: 88 86 83 80  Resp: 20 19 20 18   Temp:      TempSrc:      SpO2: 93% 93% 93% 96%    Intake/Output Summary (Last 24 hours) at 03/20/2021 1110 Last data filed at 03/20/2021 0543 Gross per 24 hour  Intake 2044.36 ml  Output --  Net 2044.36 ml   There were no vitals filed for this  visit.  Gen: 60 y.o. male in no distress  Pulm: Non-labored breathing at rest. Clear to auscultation bilaterally.  CV: Regular rate and rhythm. No murmur, rub, or gallop. No JVD, +LLE pitting edema. GI: Abdomen soft, non-tender, non-distended, with normoactive bowel sounds. No organomegaly or masses felt. Ext: Warm, no deformities Skin: No rashes, lesions or ulcers on visualized skin. Neuro: Alert and oriented. No focal neurological deficits. Psych: Judgement and insight appear normal. Mood & affect appropriate.   Data Reviewed: I have personally reviewed following labs and imaging studies  CBC: Recent Labs  Lab 03/19/21 1331 03/19/21 1600 03/20/21 0309  WBC 20.3* 20.4* 16.5*  NEUTROABS 17.9* 17.6*  --   HGB 9.0* 8.8* 8.5*  HCT 28.0* 28.3* 26.9*  MCV 92.1 95.6 95.1  PLT 395 395 579   Basic Metabolic Panel: Recent Labs  Lab 03/19/21 1331 03/19/21 1600 03/20/21 0309  NA 134* 137 133*  K 4.2 4.6 4.1  CL 101 104 100  CO2 20* 22 23  GLUCOSE 78 94 97  BUN 18 21* 18  CREATININE 0.74 0.72 0.78  CALCIUM 9.1 8.7* 8.4*   GFR: Estimated Creatinine Clearance: 114.5 mL/min (by C-G formula based on SCr of 0.78 mg/dL). Liver Function Tests: Recent Labs  Lab 03/19/21 1331 03/19/21 1600  AST 98* 90*  ALT 28 30  ALKPHOS 458* 412*  BILITOT 0.8 1.1  PROT 6.8 7.0  ALBUMIN 2.0* 2.2*   No results for input(s): LIPASE, AMYLASE in the last 168 hours. No results for input(s): AMMONIA in the last 168 hours. Coagulation Profile: Recent Labs  Lab 03/19/21 1600 03/20/21 0309  INR 1.1 1.3*   Cardiac Enzymes: No results for input(s): CKTOTAL, CKMB, CKMBINDEX, TROPONINI in the last 168 hours. BNP (last 3 results) No results for input(s): PROBNP in the last 8760 hours. HbA1C: Recent Labs    03/20/21 0309  HGBA1C 5.4   CBG: Recent Labs  Lab 03/20/21 0751  GLUCAP 118*   Lipid Profile: No results for input(s): CHOL, HDL, LDLCALC, TRIG, CHOLHDL, LDLDIRECT in the last 72  hours. Thyroid Function Tests: No results for input(s): TSH, T4TOTAL, FREET4, T3FREE, THYROIDAB in the last 72 hours. Anemia Panel: Recent Labs    03/20/21 0309  FERRITIN 3,520*   Urine analysis:    Component Value Date/Time   COLORURINE YELLOW 03/19/2021 1724   APPEARANCEUR CLEAR 03/19/2021 1724   LABSPEC 1.025 03/19/2021 1724   PHURINE 6.0 03/19/2021 1724   GLUCOSEU NEGATIVE 03/19/2021 1724   HGBUR SMALL (A) 03/19/2021 1724   BILIRUBINUR NEGATIVE 03/19/2021 1724   KETONESUR NEGATIVE 03/19/2021 1724   PROTEINUR >300 (A) 03/19/2021 1724   UROBILINOGEN 0.2 12/05/2013 2348   NITRITE NEGATIVE 03/19/2021 1724   LEUKOCYTESUR NEGATIVE 03/19/2021 1724   Recent Results (from the past 240 hour(s))  Resp Panel by RT-PCR (Flu A&B, Covid) Nasopharyngeal Swab     Status: Abnormal   Collection Time: 03/19/21  5:25 PM   Specimen: Nasopharyngeal Swab; Nasopharyngeal(NP) swabs in vial transport medium  Result Value Ref Range Status   SARS Coronavirus 2 by RT PCR POSITIVE (A) NEGATIVE  Final    Comment: RESULT CALLED TO, READ BACK BY AND VERIFIED WITH: LYNCH,J. RN @1948  ON 03/19/2021 BY COHEN,K (NOTE) SARS-CoV-2 target nucleic acids are DETECTED.  The SARS-CoV-2 RNA is generally detectable in upper respiratory specimens during the acute phase of infection. Positive results are indicative of the presence of the identified virus, but do not rule out bacterial infection or co-infection with other pathogens not detected by the test. Clinical correlation with patient history and other diagnostic information is necessary to determine patient infection status. The expected result is Negative.  Fact Sheet for Patients: EntrepreneurPulse.com.au  Fact Sheet for Healthcare Providers: IncredibleEmployment.be  This test is not yet approved or cleared by the [place] FDA and  has been authorized for detection and/or diagnosis of SARS-CoV-2 by FDA under an  Emergency Use Authorization (EUA).  This EUA will remain in effect (meaning this te st can be used) for the duration of  the COVID-19 declaration under Section 564(b)(1) of the Act, 21 U.S.C. section 360bbb-3(b)(1), unless the authorization is terminated or revoked sooner.     Influenza A by PCR NEGATIVE NEGATIVE Final   Influenza B by PCR NEGATIVE NEGATIVE Final    Comment: (NOTE) The Xpert Xpress SARS-CoV-2/FLU/RSV plus assay is intended as an aid in the diagnosis of influenza from Nasopharyngeal swab specimens and should not be used as a sole basis for treatment. Nasal washings and aspirates are unacceptable for Xpert Xpress SARS-CoV-2/FLU/RSV testing.  Fact Sheet for Patients: EntrepreneurPulse.com.au  Fact Sheet for Healthcare Providers: IncredibleEmployment.be  This test is not yet approved or cleared by the [place] FDA and has been authorized for detection and/or diagnosis of SARS-CoV-2 by FDA under an Emergency Use Authorization (EUA). This EUA will remain in effect (meaning this test can be used) for the duration of the COVID-19 declaration under Section 564(b)(1) of the Act, 21 U.S.C. section 360bbb-3(b)(1), unless the authorization is terminated or revoked.  Performed at [facility], [place] [address] [place], [place] [address]   MRSA Next Gen by PCR, Nasal     Status: None   Collection Time: 03/19/21  6:01 PM   Specimen: Nasal Mucosa; Nasal Swab  Result Value Ref Range Status   MRSA by PCR Next Gen NOT DETECTED NOT DETECTED Final    Comment: (NOTE) The GeneXpert MRSA Assay (FDA approved for NASAL specimens only), is one component of a comprehensive MRSA colonization surveillance program. It is not intended to diagnose MRSA infection nor to guide or monitor treatment for MRSA infections. Test performance is not FDA approved in patients less than 57 years old. Performed at [facility],  [place] [address], [place], [place] [address]   Blood Culture (routine x 2)     Status: None (Preliminary result)   Collection Time: 03/19/21  7:46 PM   Specimen: BLOOD  Result Value Ref Range Status   Specimen Description   Final    BLOOD LEFT ANTECUBITAL Performed at [facility] [address], [place], [place] [address]    Special Requests   Final    BOTTLES DRAWN AEROBIC AND ANAEROBIC Blood Culture adequate volume Performed at [facility] [address] [place], [place] [address]    Culture   Final    NO GROWTH < 12 HOURS Performed at [facility] [address] [place],  [address]    Report Status PENDING  Incomplete  Blood Culture (routine x 2)     Status: None (Preliminary  result)   Collection Time: 03/19/21  7:46 PM   Specimen: BLOOD  Result Value Ref Range Status   Specimen Description   Final    BLOOD RIGHT ANTECUBITAL Performed at [facility] [address] [place], [place] [address]    Special Requests   Final    BOTTLES DRAWN AEROBIC AND ANAEROBIC Blood Culture adequate volume Performed at [facility] [address], [place], [place] [address]    Culture   Final    NO GROWTH < 12 HOURS Performed at [facility] [address], [place], [place] [address]    Report Status PENDING  Incomplete      Radiology Studies: CT Chest W Contrast  Result Date: 03/19/2021 CLINICAL DATA:  Suspected pneumonia, effusion or abscess. EXAM: CT CHEST WITH CONTRAST TECHNIQUE: Multidetector CT imaging of the chest was performed during intravenous contrast administration. CONTRAST:  91mL OMNIPAQUE IOHEXOL 350 MG/ML SOLN COMPARISON:  None. FINDINGS: Cardiovascular: No significant vascular findings. Normal heart size with marked severity coronary artery calcification. No pericardial effusion. Mediastinum/Nodes: There is moderate severity pretracheal, AP window, subcarinal and  bilateral hilar lymphadenopathy. The largest lymph node is seen within the subcarinal region and measures approximately 2.6 cm x 2.1 cm. Thyroid gland, trachea, and esophagus demonstrate no significant findings. Lungs/Pleura: A 1.1 cm noncalcified lung nodule is seen within the lateral aspect of the right upper lobe (axial CT image 35, CT series 7). An 8 mm noncalcified lung nodule is seen along the anterior aspect of the left lower lobe (axial CT image 57, CT series 7). A 7 mm noncalcified lung nodule is seen within the posterolateral aspect of the left lower lobe (axial CT image 90, CT series 7). This measures approximately 3 mm on the prior exam (image 46, CT series 2). An additional 7 mm noncalcified lung nodule is noted (axial CT image 92, CT series 7). Mild to moderate severity areas of atelectasis are seen within the bilateral upper lobes, right middle lobe and right lower lobe. There is a small to moderate sized right-sided pleural effusion. No pneumothorax is identified. Upper Abdomen: Multiple heterogeneous low-attenuation liver lesions are seen. The largest measures approximately 5.6 cm x 5.3 cm and is seen within the left lobe (axial CT image 120, CT series 2). Musculoskeletal: Multiple sclerotic areas are seen throughout the thoracic and lumbar spine. IMPRESSION: 1. Moderate severity mediastinal and bilateral hilar lymphadenopathy, with multiple bilateral noncalcified lung nodules. Findings are concerning for metastatic disease. 2. Multiple heterogeneous low-attenuation liver lesions, concerning for metastatic disease. 3. Small to moderate sized right-sided pleural effusion. 4. Mild to moderate severity bilateral areas of atelectasis. 5. Findings likely consistent with osseous metastasis throughout the thoracic and lumbar spine. Electronically Signed   By: Virgina Norfolk M.D.   On: 03/19/2021 19:37   DG Chest Port 1 View  Result Date: 03/19/2021 CLINICAL DATA:  Shortness of breath. EXAM: PORTABLE  CHEST 1 VIEW COMPARISON:  October 05, 2020 FINDINGS: Mildly increased interstitial lung markings are seen. Mild areas of superimposed atelectasis and/or early infiltrate are noted within the mid right lung and right lung base. There is no evidence of a pleural effusion or pneumothorax. The heart size and mediastinal contours are within normal limits. The visualized skeletal structures are unremarkable. IMPRESSION: Mild interstitial edema with mild areas of superimposed atelectasis and/or early infiltrate within the mid right lung and right lung base. Electronically Signed   By: Virgina Norfolk M.D.   On: 03/19/2021 17:51  Scheduled Meds:  amLODipine  5 mg Oral Daily   vitamin C  500 mg Oral Daily   calcium-vitamin D  1 tablet Oral BID   cholecalciferol  400 Units Oral BID   empagliflozin  25 mg Oral Daily   enoxaparin (LOVENOX) injection  1 mg/kg Subcutaneous Q12H   guaiFENesin  600 mg Oral BID   insulin aspart  0-15 Units Subcutaneous TID AC & HS   Ipratropium-Albuterol  1 puff Inhalation QID   lisinopril  40 mg Oral QPM   methylPREDNISolone (SOLU-MEDROL) injection  120 mg Intravenous Q24H   zinc sulfate  220 mg Oral Daily   Continuous Infusions:  sodium chloride 100 mL/hr at 03/20/21 0328   azithromycin Stopped (03/20/21 0543)   cefTRIAXone (ROCEPHIN)  IV Stopped (03/20/21 0427)   [START ON 03/21/2021] remdesivir 100 mg in NS 100 mL       LOS: 1 day   Time spent: 35 minutes.  Patrecia Pour, MD [facility] [email or web address] 03/20/2021, 11:10 AM

## 2021-03-20 NOTE — Progress Notes (Signed)
Events overnight noted.  Patient known to me with history of prostate cancer that is rapidly progressing.  Please see my office note from March 19, 2021 for details.  Laboratory data and CT scan were personally reviewed and showed predominantly progression of disease with a rapid rise in his PSA up to 1258.  He has acute respiratory failure related to COVID-19 infection associated with possible bacterial infection.  He is not currently receiving anticancer treatment with the plan to start therapy in the near future clearly will be delayed given his acute illness.  His overall prognosis is unfortunately poor given the rapid progression of his cancer in the setting of respiratory failure and possible viral and bacterial infection.  I recommended continuing aggressive measures and treating reversible conditions.  If his condition deteriorates further, I would advise against aggressive measures including cardiac pulmonary resuscitation.  Will continue to follow.

## 2021-03-21 ENCOUNTER — Inpatient Hospital Stay (HOSPITAL_COMMUNITY): Payer: 59

## 2021-03-21 ENCOUNTER — Telehealth: Payer: Self-pay | Admitting: Oncology

## 2021-03-21 ENCOUNTER — Other Ambulatory Visit: Payer: Self-pay | Admitting: Oncology

## 2021-03-21 DIAGNOSIS — A419 Sepsis, unspecified organism: Principal | ICD-10-CM

## 2021-03-21 DIAGNOSIS — J188 Other pneumonia, unspecified organism: Secondary | ICD-10-CM

## 2021-03-21 DIAGNOSIS — C61 Malignant neoplasm of prostate: Secondary | ICD-10-CM | POA: Diagnosis not present

## 2021-03-21 DIAGNOSIS — M7989 Other specified soft tissue disorders: Secondary | ICD-10-CM

## 2021-03-21 DIAGNOSIS — D649 Anemia, unspecified: Secondary | ICD-10-CM

## 2021-03-21 DIAGNOSIS — J189 Pneumonia, unspecified organism: Secondary | ICD-10-CM | POA: Diagnosis not present

## 2021-03-21 DIAGNOSIS — J90 Pleural effusion, not elsewhere classified: Secondary | ICD-10-CM | POA: Diagnosis not present

## 2021-03-21 LAB — CBC WITH DIFFERENTIAL/PLATELET
Abs Immature Granulocytes: 0.16 10*3/uL — ABNORMAL HIGH (ref 0.00–0.07)
Basophils Absolute: 0 10*3/uL (ref 0.0–0.1)
Basophils Relative: 0 %
Eosinophils Absolute: 0 10*3/uL (ref 0.0–0.5)
Eosinophils Relative: 0 %
HCT: 25.4 % — ABNORMAL LOW (ref 39.0–52.0)
Hemoglobin: 8.1 g/dL — ABNORMAL LOW (ref 13.0–17.0)
Immature Granulocytes: 1 %
Lymphocytes Relative: 6 %
Lymphs Abs: 1 10*3/uL (ref 0.7–4.0)
MCH: 29.9 pg (ref 26.0–34.0)
MCHC: 31.9 g/dL (ref 30.0–36.0)
MCV: 93.7 fL (ref 80.0–100.0)
Monocytes Absolute: 1.5 10*3/uL — ABNORMAL HIGH (ref 0.1–1.0)
Monocytes Relative: 8 %
Neutro Abs: 15 10*3/uL — ABNORMAL HIGH (ref 1.7–7.7)
Neutrophils Relative %: 85 %
Platelets: 378 10*3/uL (ref 150–400)
RBC: 2.71 MIL/uL — ABNORMAL LOW (ref 4.22–5.81)
RDW: 19.7 % — ABNORMAL HIGH (ref 11.5–15.5)
WBC: 17.7 10*3/uL — ABNORMAL HIGH (ref 4.0–10.5)
nRBC: 0 % (ref 0.0–0.2)

## 2021-03-21 LAB — PROCALCITONIN: Procalcitonin: 1.59 ng/mL

## 2021-03-21 LAB — PROTEIN, PLEURAL OR PERITONEAL FLUID: Total protein, fluid: 3.4 g/dL

## 2021-03-21 LAB — GLUCOSE, CAPILLARY
Glucose-Capillary: 83 mg/dL (ref 70–99)
Glucose-Capillary: 106 mg/dL — ABNORMAL HIGH (ref 70–99)
Glucose-Capillary: 102 mg/dL — ABNORMAL HIGH (ref 70–99)
Glucose-Capillary: 95 mg/dL (ref 70–99)

## 2021-03-21 LAB — BODY FLUID CELL COUNT WITH DIFFERENTIAL
Eos, Fluid: 0 %
Lymphs, Fluid: 53 %
Monocyte-Macrophage-Serous Fluid: 13 % — ABNORMAL LOW (ref 50–90)
Neutrophil Count, Fluid: 34 % — ABNORMAL HIGH (ref 0–25)
Total Nucleated Cell Count, Fluid: 881 cu mm (ref 0–1000)

## 2021-03-21 LAB — ALBUMIN, PLEURAL OR PERITONEAL FLUID: Albumin, Fluid: 1.6 g/dL

## 2021-03-21 LAB — BASIC METABOLIC PANEL
Anion gap: 9 (ref 5–15)
BUN: 27 mg/dL — ABNORMAL HIGH (ref 6–20)
CO2: 20 mmol/L — ABNORMAL LOW (ref 22–32)
Calcium: 8.6 mg/dL — ABNORMAL LOW (ref 8.9–10.3)
Chloride: 109 mmol/L (ref 98–111)
Creatinine, Ser: 0.69 mg/dL (ref 0.61–1.24)
GFR, Estimated: >60 mL/min (ref >60)
Glucose, Bld: 113 mg/dL — ABNORMAL HIGH (ref 70–99)
Potassium: 3.7 mmol/L (ref 3.5–5.1)
Sodium: 138 mmol/L (ref 135–145)

## 2021-03-21 LAB — URINE CULTURE

## 2021-03-21 LAB — LACTATE DEHYDROGENASE, PLEURAL OR PERITONEAL FLUID: LD, Fluid: 305 U/L — ABNORMAL HIGH (ref 3–23)

## 2021-03-21 LAB — D-DIMER, QUANTITATIVE: D-Dimer, Quant: >20 ug/mL-FEU — ABNORMAL HIGH (ref 0.00–0.50)

## 2021-03-21 MED ORDER — FUROSEMIDE 10 MG/ML IJ SOLN
40.0000 mg | Freq: Once | INTRAMUSCULAR | Status: AC
  Administered 2021-03-21: 40 mg via INTRAVENOUS
  Filled 2021-03-21: qty 4

## 2021-03-21 MED ORDER — ENOXAPARIN SODIUM 40 MG/0.4ML IJ SOSY
40.0000 mg | PREFILLED_SYRINGE | INTRAMUSCULAR | Status: DC
  Administered 2021-03-22: 40 mg via SUBCUTANEOUS
  Filled 2021-03-21: qty 0.4

## 2021-03-21 MED ORDER — OXYCODONE HCL 5 MG PO TABS
5.0000 mg | ORAL_TABLET | ORAL | Status: DC | PRN
  Administered 2021-03-21 – 2021-03-22 (×3): 5 mg via ORAL
  Filled 2021-03-21 (×3): qty 1

## 2021-03-21 MED ORDER — LIDOCAINE HCL 1 % IJ SOLN
INTRAMUSCULAR | Status: AC
  Filled 2021-03-21: qty 20

## 2021-03-21 MED ORDER — IOHEXOL 350 MG/ML SOLN
80.0000 mL | Freq: Once | INTRAVENOUS | Status: AC | PRN
  Administered 2021-03-21: 80 mL via INTRAVENOUS

## 2021-03-21 NOTE — Progress Notes (Signed)
PROCEDURE SUMMARY:  Successful US guided paracentesis from right chest.  Yielded 650 of yellow fluid.  No immediate complications.  Pt tolerated well.   Specimen was sent for labs.  EBL negligible  Irais Mottram PA-C 03/21/2021 4:13 PM

## 2021-03-21 NOTE — Progress Notes (Signed)
PROGRESS NOTE  [name]  CZY:[id number] DOB: [birth year] DOA: 03/19/2021 PCP: Lujean Amel, MD  Outpatient Specialists: Oncology, Dr. Alen Blew Brief Narrative: Travis Bowman is a 60 y.o. 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 He was found to be febrile with multifocal infiltrates on CT chest with right pleural effusion. WBC 20.3k  Assessment & Plan: Active Problems:   COVID-19  Sepsis due to multifocal pneumonia: With elevated PCT, neutrophilic leukocytosis and abrupt improvement with antibiotics, as well as prior hx recent covid and elevated CT value, suspect bacterial pneumonia is primary driver of presentation. Also had fever, tachypnea, and sinus tachycardia on presentation. MRSA PCR negative.  - Continue ceftriaxone, azithromycin.  - Monitor blood and urine cultures. - Will monitor PCT and CBC, though solumedrol was given, so leukocytosis is less reliable.  Right pleural effusion: Small-moderate on presentation, now large with associated right-sided chest pain and dyspnea at rest and exertion.  - Received >4L IVF in total since admission. Will trial lasix, check echo.  - Hemothorax not ruled out. Have to hold therapeutic anticoagulation for now with negative VTE work up to date. Will d/w pulmonary for consideration of thoracentesis.   Right-sided chest pain: Unclear primary cause, though considerations include enlarging effusion, possible distal PE, and healing rib fractures.  - With additional pain related to bony metastases, will give oxyIR which was tolerated and improved pain previously.  Elevated d-dimer: High risk for VTE. LE venous U/S without thrombus with difficulty visualizing left peroneal or PFV. No PE noted on CTA chest to the segmental level  (limited distal evaluation). D-dimer remains >20.  - Will deescalate lovenox to prophylactic dose for now with progressive anemia and enlarging effusion, though high suspicion for DVT/PE continues. Monitor hgb.   - Checking echo, though RV strain not evident on CTA and if there is PE it is distal.  History of covid-19 infection: July 2022 diagnosed with home test, given paxlovid by PCP at that time with resolution of symptoms (chills only).  - Tested positive by PCR here, though CT is 38.8 and is within 90 days of positive testing. We will, therefore, discontinue isolation and covid-directed therapies.   Advanced prostate CA: Dx 2021 s/p radical prostatectomy, bilateral pelvic lymphadenectomy July 2021, then radiation Nov 2017 - Jan 2018 to prostate, pelvic LNs, prostatic fossa, and 6 months androgen deprivation. Bony involvement Dx Jan 2021, started Xtandi 160mg  daily, stopped Dec 2021 due to disease progression. Then taxotere chemo x9 cycles thru June 2022.  - Dr. Alen Blew to arrange follow up for continued aggressive interventions. PSA up to 1,258.   T2DM: HbA1c 5.4% - Continue jardiance. 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   Anemia of malignancy/chronic disease: Previous hgb baseline seems to have been ~12 in June 2022, down to 9.8 last month > 9 on arrival here, now down to 8.1.  - Prophylactic dose lovenox.  - Monitor hgb.   Demand myocardial ischemia: No chest pain or ischemic ECG changes to suggests troponin elevation which is mild is due to ACS.   Obesity: Estimated body mass index is 33.99 kg/m as calculated from the following:   Height as of this encounter: 5\' 8"  (1.727  m).   Weight as of this encounter: 101.4 kg.  DVT prophylaxis: Lovenox 1mg /kg given this AM, will give 40mg  next tomorrow morning if hgb stable. Code Status: Full Family  Communication: Wife at bedside Disposition Plan:  Status is: Inpatient  Remains inpatient appropriate because:Inpatient level of care appropriate due to severity of illness  Dispo: The patient is from: Home              Anticipated d/c is to: Home              Patient currently is not medically stable to d/c.  Consultants:  Oncology  PCCM  Procedures:  None  Antimicrobials: Vancomycin, cefepime 9/20 Ceftriaxone, azithromycin 9/20 >> Remdesivir 9/20 x1   Subjective: Was feeling better yesterday but overnight developed worsening dyspnea, right-sided chest pain worse with breathing. Dyspnea is present at rest and with exertion. No bleeding or bruising, or lightheadedness on standing. No fevers. Cough improving. Pain in back is moderate-severe, constant, was improved with oxyIR which he requests now.  Objective: Vitals:   03/20/21 2038 03/21/21 0000 03/21/21 0548 03/21/21 1128  BP: 135/67 132/68 137/73 140/66  Pulse: 82 80 81 88  Resp: 18 18 18  (!) 22  Temp: 99 F (37.2 C) 98.1 F (36.7 C) 97.7 F (36.5 C) 98.6 F (37 C)  TempSrc: Oral Oral Oral Oral  SpO2: 95% 97% 94% 94%  Weight:      Height:        Intake/Output Summary (Last 24 hours) at 03/21/2021 1152 Last data filed at 03/21/2021 0800 Gross per 24 hour  Intake 3291.63 ml  Output 450 ml  Net 2841.63 ml   Filed Weights   03/20/21 1539  Weight: 101.4 kg   Gen: 60 y.o. male in no distress Pulm: Nonlabored breathing room air, diminished up to midzone on right with scattered crackles, no wheezes. CV: Regular rate and rhythm. No murmur, rub, or gallop. No JVD, L > RLE dependent edema. GI: Abdomen soft, non-tender, non-distended, with normoactive bowel sounds.  Ext: Warm, no deformities Skin: No new rashes, lesions or ulcers on visualized skin. Neuro: Alert and oriented. No focal neurological deficits. Psych: Judgement and insight appear fair. Mood euthymic & affect congruent. Behavior is appropriate.    Data  Reviewed: I have personally reviewed following labs and imaging studies  CBC: Recent Labs  Lab 03/19/21 1331 03/19/21 1600 03/20/21 0309 03/21/21 0343  WBC 20.3* 20.4* 16.5* 17.7*  NEUTROABS 17.9* 17.6*  --  15.0*  HGB 9.0* 8.8* 8.5* 8.1*  HCT 28.0* 28.3* 26.9* 25.4*  MCV 92.1 95.6 95.1 93.7  PLT 395 395 357 937   Basic Metabolic Panel: Recent Labs  Lab 03/19/21 1331 03/19/21 1600 03/20/21 0309 03/21/21 0343  NA 134* 137 133* 138  K 4.2 4.6 4.1 3.7  CL 101 104 100 109  CO2 20* 22 23 20*  GLUCOSE 78 94 97 113*  BUN 18 21* 18 27*  CREATININE 0.74 0.72 0.78 0.69  CALCIUM 9.1 8.7* 8.4* 8.6*   GFR: Estimated Creatinine Clearance: 114.8 mL/min (by C-G formula based on SCr of 0.69 mg/dL). Liver Function Tests: Recent Labs  Lab 03/19/21 1331 03/19/21 1600  AST 98* 90*  ALT 28 30  ALKPHOS 458* 412*  BILITOT 0.8 1.1  PROT 6.8 7.0  ALBUMIN 2.0* 2.2*   No results for input(s): LIPASE, AMYLASE in the last 168 hours. No results for input(s): AMMONIA in the last 168 hours. Coagulation Profile: Recent Labs  Lab 03/19/21 1600 03/20/21 0309  INR 1.1 1.3*   Cardiac Enzymes: No results for input(s): CKTOTAL, CKMB, CKMBINDEX, TROPONINI in the last 168 hours. BNP (last 3 results) No results for input(s): PROBNP in the last 8760 hours. HbA1C: Recent Labs    03/20/21 0309  HGBA1C 5.4   CBG: Recent Labs  Lab 03/20/21 1227 03/20/21 1650 03/20/21 2041 03/21/21 0723 03/21/21 1122  GLUCAP 146* 97 122* 83 106*   Lipid Profile: No results for input(s): CHOL, HDL, LDLCALC, TRIG, CHOLHDL, LDLDIRECT in the last 72 hours. Thyroid Function Tests: No results for input(s): TSH, T4TOTAL, FREET4, T3FREE, THYROIDAB in the last 72 hours. Anemia Panel: Recent Labs    03/20/21 0309  FERRITIN 3,520*   Urine analysis:    Component Value Date/Time   COLORURINE YELLOW 03/19/2021 1724   APPEARANCEUR CLEAR 03/19/2021 1724   LABSPEC 1.025 03/19/2021 1724   PHURINE 6.0  03/19/2021 1724   GLUCOSEU NEGATIVE 03/19/2021 1724   HGBUR SMALL (A) 03/19/2021 1724   BILIRUBINUR NEGATIVE 03/19/2021 1724   KETONESUR NEGATIVE 03/19/2021 1724   PROTEINUR >300 (A) 03/19/2021 1724   UROBILINOGEN 0.2 12/05/2013 2348   NITRITE NEGATIVE 03/19/2021 1724   LEUKOCYTESUR NEGATIVE 03/19/2021 1724   Recent Results (from the past 240 hour(s))  Urine Culture     Status: Abnormal   Collection Time: 03/19/21  5:24 PM   Specimen: In/Out Cath Urine  Result Value Ref Range Status   Specimen Description   Final    IN/OUT CATH URINE Performed at [facility], [place] [address], [place], [place] [address]    Special Requests   Final    NONE Performed at [facility], [place] [address] [place], [place] [address]    Culture MULTIPLE SPECIES PRESENT, SUGGEST RECOLLECTION (A)  Final   Report Status 03/21/2021 FINAL  Final  Resp Panel by RT-PCR (Flu A&B, Covid) Nasopharyngeal Swab     Status: Abnormal   Collection Time: 03/19/21  5:25 PM   Specimen: Nasopharyngeal Swab; Nasopharyngeal(NP) swabs in vial transport medium  Result Value Ref Range Status   SARS Coronavirus 2 by RT PCR POSITIVE (A) NEGATIVE Final    Comment: RESULT CALLED TO, READ BACK BY AND VERIFIED WITH: LYNCH,J. RN @1948  ON 03/19/2021 BY COHEN,K (NOTE) SARS-CoV-2 target nucleic acids are DETECTED.  The SARS-CoV-2 RNA is generally detectable in upper respiratory specimens during the acute phase of infection. Positive results are indicative of the presence of the identified virus, but do not rule out bacterial infection or co-infection with other pathogens not detected by the test. Clinical correlation with patient history and other diagnostic information is necessary to determine patient infection status. The expected result is Negative.  Fact Sheet for Patients: EntrepreneurPulse.com.au  Fact Sheet for Healthcare  Providers: IncredibleEmployment.be  This test is not yet approved or cleared by the [place] FDA and  has been authorized for detection and/or diagnosis of SARS-CoV-2 by FDA under an Emergency Use Authorization (EUA).  This EUA will remain in effect (meaning this te st can be used) for the duration of  the COVID-19 declaration under Section 564(b)(1) of the Act, 21 U.S.C. section 360bbb-3(b)(1), unless the authorization is terminated or revoked sooner.     Influenza A by PCR NEGATIVE NEGATIVE Final   Influenza B by PCR NEGATIVE NEGATIVE Final    Comment: (NOTE) The Xpert Xpress SARS-CoV-2/FLU/RSV plus assay is intended as an aid in the diagnosis of influenza from Nasopharyngeal swab specimens and should not be used as a sole basis for treatment.  Nasal washings and aspirates are unacceptable for Xpert Xpress SARS-CoV-2/FLU/RSV testing.  Fact Sheet for Patients: EntrepreneurPulse.com.au  Fact Sheet for Healthcare Providers: IncredibleEmployment.be  This test is not yet approved or cleared by the [place] FDA and has been authorized for detection and/or diagnosis of SARS-CoV-2 by FDA under an Emergency Use Authorization (EUA). This EUA will remain in effect (meaning this test can be used) for the duration of the COVID-19 declaration under Section 564(b)(1) of the Act, 21 U.S.C. section 360bbb-3(b)(1), unless the authorization is terminated or revoked.  Performed at [facility], [place] [address] [place], [place] [address]   MRSA Next Gen by PCR, Nasal     Status: None   Collection Time: 03/19/21  6:01 PM   Specimen: Nasal Mucosa; Nasal Swab  Result Value Ref Range Status   MRSA by PCR Next Gen NOT DETECTED NOT DETECTED Final    Comment: (NOTE) The GeneXpert MRSA Assay (FDA approved for NASAL specimens only), is one component of a comprehensive MRSA colonization surveillance program. It is not  intended to diagnose MRSA infection nor to guide or monitor treatment for MRSA infections. Test performance is not FDA approved in patients less than 59 years old. Performed at [facility], [place] [address], [place], [place] [address]   Blood Culture (routine x 2)     Status: None (Preliminary result)   Collection Time: 03/19/21  7:46 PM   Specimen: BLOOD  Result Value Ref Range Status   Specimen Description   Final    BLOOD LEFT ANTECUBITAL Performed at [place] [address] [place], [place] [address]    Special Requests   Final    BOTTLES DRAWN AEROBIC AND ANAEROBIC Blood Culture adequate volume Performed at [place] [address], [place], [place] [address]    Culture   Final    NO GROWTH 2 DAYS Performed at [place] [address] [place], [place] [address]    Report Status PENDING  Incomplete  Blood Culture (routine x 2)     Status: None (Preliminary result)   Collection Time: 03/19/21  7:46 PM   Specimen: BLOOD  Result Value Ref Range Status   Specimen Description   Final    BLOOD RIGHT ANTECUBITAL Performed at [place] [address], [place], [place] [address]    Special Requests   Final    BOTTLES DRAWN AEROBIC AND ANAEROBIC Blood Culture adequate volume Performed at [place] [address] [place], [place] [address]    Culture   Final    NO GROWTH 1 DAY Performed at [facility], [place] [address] [place], [place] [address]    Report Status PENDING  Incomplete      Radiology Studies: CT Chest W Contrast  Result Date: 03/19/2021 CLINICAL DATA:  Suspected pneumonia, effusion or abscess. EXAM: CT CHEST WITH CONTRAST TECHNIQUE: Multidetector CT imaging of the chest was performed during intravenous contrast administration. CONTRAST:  4mL OMNIPAQUE IOHEXOL 350 MG/ML SOLN COMPARISON:  None. FINDINGS: Cardiovascular: No significant  vascular findings. Normal heart size with marked severity coronary artery calcification. No pericardial effusion. Mediastinum/Nodes: There is moderate severity pretracheal, AP window, subcarinal and bilateral hilar lymphadenopathy. The largest lymph node is seen within the subcarinal region and measures approximately 2.6 cm x 2.1 cm. Thyroid gland, trachea, and esophagus demonstrate no significant findings. Lungs/Pleura: A 1.1 cm noncalcified lung nodule is seen within the lateral aspect of  the right upper lobe (axial CT image 35, CT series 7). An 8 mm noncalcified lung nodule is seen along the anterior aspect of the left lower lobe (axial CT image 57, CT series 7). A 7 mm noncalcified lung nodule is seen within the posterolateral aspect of the left lower lobe (axial CT image 90, CT series 7). This measures approximately 3 mm on the prior exam (image 46, CT series 2). An additional 7 mm noncalcified lung nodule is noted (axial CT image 92, CT series 7). Mild to moderate severity areas of atelectasis are seen within the bilateral upper lobes, right middle lobe and right lower lobe. There is a small to moderate sized right-sided pleural effusion. No pneumothorax is identified. Upper Abdomen: Multiple heterogeneous low-attenuation liver lesions are seen. The largest measures approximately 5.6 cm x 5.3 cm and is seen within the left lobe (axial CT image 120, CT series 2). Musculoskeletal: Multiple sclerotic areas are seen throughout the thoracic and lumbar spine. IMPRESSION: 1. Moderate severity mediastinal and bilateral hilar lymphadenopathy, with multiple bilateral noncalcified lung nodules. Findings are concerning for metastatic disease. 2. Multiple heterogeneous low-attenuation liver lesions, concerning for metastatic disease. 3. Small to moderate sized right-sided pleural effusion. 4. Mild to moderate severity bilateral areas of atelectasis. 5. Findings likely consistent with osseous metastasis throughout the  thoracic and lumbar spine. Electronically Signed   By: Virgina Norfolk M.D.   On: 03/19/2021 19:37   CT Angio Chest Pulmonary Embolism (PE) W or WO Contrast  Result Date: 03/21/2021 CLINICAL DATA:  PE suspected, low/intermediate prob, positive D-dimer hx covid and malignancy, d-dimer >20, negative LE venous U/S EXAM: CT ANGIOGRAPHY CHEST WITH CONTRAST TECHNIQUE: Multidetector CT imaging of the chest was performed using the standard protocol during bolus administration of intravenous contrast. Multiplanar 3D CT image reconstructions and MIPs were obtained to evaluate the vascular anatomy. CONTRAST:  43mL OMNIPAQUE IOHEXOL 350 MG/ML SOLN COMPARISON:  CT chest 2 days prior FINDINGS: Cardiovascular: Satisfactory opacification of the pulmonary arteries to the proximal segmental level. Evaluation of the more distal pulmonary arteries is limited due to motion and contrast bolus timing. No evidence of proximal pulmonary embolism. The thoracic aorta is normal in caliber. Normal heart size. No pericardial effusion. Mediastinum/Nodes: Enlarged mediastinal and left axillary lymph nodes are again seen, similar to recent CT chest. The thyroid gland appears normal. Lungs/Pleura: Large right pleural effusion has increased in size. No pneumothorax. Predominantly peripheral patchy ground-glass opacities are similar to slightly increased. Musculoskeletal: Numerous sclerotic lesions in the vertebral bodies of the thoracic and lumbar spine and ribs, similar the previous exam. Multiple healing pathologic fractures of the ribs. Upper abdomen: Again seen are multiple low-attenuation lesions scattered throughout the liver, which are difficult to measure definitively due to phase of contrast. IMPRESSION: 1. No evidence of pulmonary embolism within limits of the exam as described. 2. Similar to slightly worsened, predominantly peripheral, ground-glass opacities would be compatible with recent diagnosis of COVID infection versus other  multifocal pneumonia. 3. Large right pleural effusion has increased in size. 4. Mediastinal and left axillary lymphadenopathy. In the setting of active infection, it is unclear if these represent reactive lymphadenopathy versus metastatic disease. Consider short-term follow-up after resolution of infectious symptoms. 5. Multiple sclerotic lesions involving the axial skeleton and bilateral ribs, along with numerous ill-defined hypodense lesions scattered throughout the liver, are compatible with history of metastatic disease. Electronically Signed   By: Albin Felling M.D.   On: 03/21/2021 10:48   DG Chest [facility]  Result Date: 03/19/2021 CLINICAL DATA:  Shortness of breath. EXAM: PORTABLE CHEST 1 VIEW COMPARISON:  October 05, 2020 FINDINGS: Mildly increased interstitial lung markings are seen. Mild areas of superimposed atelectasis and/or early infiltrate are noted within the mid right lung and right lung base. There is no evidence of a pleural effusion or pneumothorax. The heart size and mediastinal contours are within normal limits. The visualized skeletal structures are unremarkable. IMPRESSION: Mild interstitial edema with mild areas of superimposed atelectasis and/or early infiltrate within the mid right lung and right lung base. Electronically Signed   By: Virgina Norfolk M.D.   On: 03/19/2021 17:51   VAS [place] LOWER EXTREMITY VENOUS (DVT)  Result Date: 03/20/2021  Lower Venous DVT Study Patient Name:  JERAL ZICK [facility]  Date of Exam:   03/20/2021 Medical Rec #: [id number]       Accession #:    [id number] Date of Birth: 09/19/1960       Patient Gender: M Patient Age:   44 years Exam [facility]  [facility] Procedure:      VAS [place] LOWER EXTREMITY VENOUS (DVT) Referring Phys: Vance Gather --------------------------------------------------------------------------------  Indications: Elevated Ddimer.  Risk Factors: COVID 19 positive Cancer. Limitations: Poor ultrasound/tissue interface and body habitus.  Comparison Study: No prior studies. Performing Technologist: Oliver Hum RVT  Examination Guidelines: A complete evaluation includes B-mode imaging, spectral Doppler, color Doppler, and power Doppler as needed of all accessible portions of each vessel. Bilateral testing is considered an integral part of a complete examination. Limited examinations for reoccurring indications may be performed as noted. The reflux portion of the exam is performed with the patient in reverse Trendelenburg.  +---------+---------------+---------+-----------+----------+--------------+ RIGHT    CompressibilityPhasicitySpontaneityPropertiesThrombus Aging +---------+---------------+---------+-----------+----------+--------------+ CFV      Full           Yes      Yes                                 +---------+---------------+---------+-----------+----------+--------------+ SFJ      Full                                                        +---------+---------------+---------+-----------+----------+--------------+ FV Prox  Full                                                        +---------+---------------+---------+-----------+----------+--------------+ FV Mid   Full                                                        +---------+---------------+---------+-----------+----------+--------------+ FV DistalFull                                                        +---------+---------------+---------+-----------+----------+--------------+ PFV      Full                                                        +---------+---------------+---------+-----------+----------+--------------+  POP      Full           Yes      Yes                                 +---------+---------------+---------+-----------+----------+--------------+ PTV      Full                                                        +---------+---------------+---------+-----------+----------+--------------+ PERO      Full                                                        +---------+---------------+---------+-----------+----------+--------------+   +---------+---------------+---------+-----------+----------+-------------------+ LEFT     CompressibilityPhasicitySpontaneityPropertiesThrombus Aging      +---------+---------------+---------+-----------+----------+-------------------+ CFV      Full           Yes      Yes                                      +---------+---------------+---------+-----------+----------+-------------------+ SFJ      Full                                                             +---------+---------------+---------+-----------+----------+-------------------+ FV Prox                 Yes      Yes                                      +---------+---------------+---------+-----------+----------+-------------------+ FV Mid                  Yes      Yes                                      +---------+---------------+---------+-----------+----------+-------------------+ FV Distal               Yes      Yes                                      +---------+---------------+---------+-----------+----------+-------------------+ PFV                                                   Not well visualized +---------+---------------+---------+-----------+----------+-------------------+ POP      Full           Yes      Yes                                      +---------+---------------+---------+-----------+----------+-------------------+  PTV      Full                                                             +---------+---------------+---------+-----------+----------+-------------------+ PERO                                                  Not well visualized +---------+---------------+---------+-----------+----------+-------------------+     Summary: RIGHT: - There is no evidence of deep vein thrombosis in the lower extremity.  - No cystic  structure found in the popliteal fossa.  LEFT: - There is no evidence of deep vein thrombosis in the lower extremity. However, portions of this examination were limited- see technologist comments above.  - No cystic structure found in the popliteal fossa.  *See table(s) above for measurements and observations. Electronically signed by Harold Barban MD on 03/20/2021 at 7:29:55 PM.    Final     Scheduled Meds:  amLODipine  5 mg Oral Daily   calcium-vitamin D  1 tablet Oral BID   cholecalciferol  400 Units Oral BID   empagliflozin  25 mg Oral Daily   enoxaparin (LOVENOX) injection  1 mg/kg Subcutaneous Q12H   guaiFENesin  600 mg Oral BID   insulin aspart  0-15 Units Subcutaneous TID AC & HS   lisinopril  40 mg Oral QPM   Continuous Infusions:  sodium chloride 100 mL/hr at 03/20/21 1610   azithromycin Stopped (03/21/21 1102)   cefTRIAXone (ROCEPHIN)  IV Stopped (03/21/21 1102)     LOS: 2 days   Time spent: 35 minutes.  Patrecia Pour, MD [facility] [email or web address] 03/21/2021, 11:52 AM

## 2021-03-21 NOTE — Plan of Care (Signed)

## 2021-03-21 NOTE — Telephone Encounter (Signed)
Scheduled per sch msg. Patient will get printout upon discharge  

## 2021-03-21 NOTE — Progress Notes (Signed)
PCCM Progress Note  Assessed patient for bedside thoracentesis but unfortunately no safe window was seen on ultrasound. Patient was informed and order written to have IR assess patient for possible thoracentesis. Greatly appreciate their assistance  Shankar Silber D. Kenton Kingfisher, NP-C [facility] Personal contact information can be found on Amion  03/21/2021, 3:02 PM

## 2021-03-21 NOTE — Progress Notes (Signed)
IP PROGRESS NOTE  Subjective:   Travis Bowman reports feeling well with improvement in his respiratory status.  He denied any cough, shortness of breath or difficulty breathing.  He is eating better at this time.  He denied any chest pain or palpitation.  He denied any dizziness or lightheadedness.  Objective:  Vital signs in last 24 hours: Temp:  [97.7 F (36.5 C)-99 F (37.2 C)] 97.7 F (36.5 C) (09/22 0548) Pulse Rate:  [80-82] 81 (09/22 0548) Resp:  [18-20] 18 (09/22 0548) BP: (129-148)/(64-74) 137/73 (09/22 0548) SpO2:  [94 %-97 %] 94 % (09/22 0548) Weight:  [223 lb 8.7 oz (101.4 kg)] 223 lb 8.7 oz (101.4 kg) (09/21 1539) Weight change:  Last BM Date: 03/20/21  Intake/Output from previous day: 09/21 0701 - 09/22 0700 In: 3051.6 [P.O.:480; I.V.:2220.9; IV Piggyback:350.8] Out: 450 [Urine:450] General: Alert, awake without distress. Head: Normocephalic atraumatic. Mouth: mucous membranes moist, pharynx normal without lesions Eyes: No scleral icterus.  Pupils are equal and round reactive to light. Resp: clear to auscultation bilaterally without rhonchi or wheezes or dullness to percussion. Cardio: regular rate and rhythm, S1, S2 normal, no murmur, click, rub or gallop GI: soft, non-tender; bowel sounds normal; no masses,  no organomegaly Musculoskeletal: No joint deformity or effusion. Neurological: No motor, sensory deficits.  Intact deep tendon reflexes. Skin: No rashes or lesions.    Lab Results: Recent Labs    03/20/21 0309 03/21/21 0343  WBC 16.5* 17.7*  HGB 8.5* 8.1*  HCT 26.9* 25.4*  PLT 357 378    BMET Recent Labs    03/20/21 0309 03/21/21 0343  NA 133* 138  K 4.1 3.7  CL 100 109  CO2 23 20*  GLUCOSE 97 113*  BUN 18 27*  CREATININE 0.78 0.69  CALCIUM 8.4* 8.6*    Studies/Results:   Medications: I have reviewed the patient's current medications.  Assessment/Plan:  60 year old with:  1.  Community-acquired pneumonia with sepsis.  Presented  with dyspnea on exertion and abnormal imaging studies.  He had was treated with broad-spectrum antibiotics with clinical improvement at this time.  He did test positive for COVID at 47 but had a high CT value and likely had residual infection from his previous COVID-19 infection.  He appears to be clinically improving and agree with the current management per the primary team.    2.  Advanced prostate cancer: Still progressing but his clinical decline appears to be more related to pneumonia and cancer.  He is scheduled to have follow-up Pluvecto in October 2022 upon recovery from this episode.  Although his prognosis from prostate cancer remains guarded he is still has reasonable performance status and aggressive measures are warranted.  3.  Lower extremity swelling: Likely related to malignancy without any evidence of deep vein thrombosis.  4.  Disposition: I do not have any objection to discharge once felt he is ready by the primary team.  We will arrange quick follow-up follow-up on his status upon discharge.  All his questions were answered to his satisfaction.  35  minutes were dedicated to this visit. The time was spent on reviewing laboratory data, imaging studies, discussing treatment options, and answering questions regarding future plan.     LOS: 2 days   Zola Button 03/21/2021, 7:21 AM

## 2021-03-21 NOTE — Consult Note (Signed)
NAME:  Travis Bowman, MRN:  [id number], DOB:  [birth year], LOS: 2 ADMISSION DATE:  03/19/2021, CONSULTATION DATE:  03/21/2021 REFERRING MD:  Dr. Bonner Puna, CHIEF COMPLAINT:  Pleural effusion    History of Present Illness:  Travis Bowman is a 60 y.o. male with a PMH significant for metastatic prostate cancer, hypertension, type 2 diabetes, history of hepatitis C, and GERD who presented to the emergency department initially for acute onset dyspnea with productive cough associated with fever and tachycardia.  Of note patient was diagnosed with COVID-pneumonia July 2021 and received outpatient treatment with paxlovid.  This for during hospitalization patient has been received treatment for sepsis due to multifocal pneumonia including IV antibiotics and IV resuscitation of greater than 4 L.  Given elevated D-dimer assessed 9/22 patient received CTA chest which was negative for acute PE but did reveal moderate to large (increased in size compared to admission CT 9/21) right pleural effusion.  Given enlarging right pleural effusion with multifocal pneumonia pulmonary critical care was consulted for further management.  Pertinent  Medical History  Metastatic prostate cancer Hypertension Type 2 diabetes Hepatitis C GERD  Significant Hospital Events:  9/21 admitting for dyspnea productive cough fever and tachycardia.  Admitted for sepsis secondary to multifocal pneumonia 9/22 D-dimer elevated resulting in order to obtain CTA chest to rule out PE.  This was negative but did reveal moderate to large right pleural effusion resulting in pulmonary consult  Interim History / Subjective:  Seen sitting up in bedside recliner in no acute distress States feeling better compared to admission  Objective   Blood pressure 140/66, pulse 88, temperature 98.6 F (37 C), temperature source Oral, resp. rate (!) 22, height _0  (1.727 m), weight 101.4 kg, SpO2 94 %.        Intake/Output Summary (Last 24 hours) at  03/21/2021 1307 Last data filed at 03/21/2021 1200 Gross per 24 hour  Intake 3541.63 ml  Output 825 ml  Net 2716.63 ml   Filed Weights   03/20/21 1539  Weight: 101.4 kg    Examination: General: Pleasant middle-age male sitting up in bedside recliner in no acute distress HEENT: [employer]/AT, MM pink/moist, PERRL,  Neuro: Alert and oriented x3, nonfocal CV: s1s2 regular rate and rhythm, no murmur, rubs, or gallops,  PULM: Diminished right base, no increased work of breathing, no added breath sounds, oxygen saturations appropriate on room air GI: soft, bowel sounds active in all 4 quadrants, non-tender, non-distended, tolerating oral diet Extremities: warm/dry, left lower extremity 2+pitting edema edema  Skin: no rashes or lesions  Resolved Hospital Problem list     Assessment & Plan:  Right pleural effusion  -CT scan chest on admission revealed small to moderate pleural effusion on repeat CTA imaging the following day effusion is now moderate to large.  Of note patient did receive greater than 4 L IV fluid for sepsis resuscitation on admission. Severe sepsis secondary to multifocal pneumonia  -Patient presented with low-grade fever, tachycardia, tachypnea, leukocytosis, elevated procalcitonin, elevated lactic acid of 2.0 with acute concern for active infection meeting criteria for sepsis Right sided chest pain  -Secondary to above Advanced metastatic prostate cancer with known metastasis to bones and lymph nodes and now likely further metastasis based off CT imaging to liver and likely lung -Patient is status post robotic assisted laparoscopic radical prostatectomy and bilateral extended pelvic lymphadenectomy and radiation. P: Given significant oncology history coupled with multifocal pneumonia and sepsis patient needs thoracentesis to collect culture and cytology.  And  spouse updated and agree to proceed with thoracentesis Obtain pleural studies  Encourage pulmonary hygiene Mobilize as  able Status post Lasix trial 9/22 Repeat chest x-ray in a.m. Continue antibiotic therapy Oncology following   Best Practice   Diet/type: Regular consistency (see orders) DVT prophylaxis: LMWH GI prophylaxis: PPI Lines: N/A Foley:  N/A Code Status:  full code Last date of multidisciplinary goals of care discussion: Per primary   Labs   CBC: Recent Labs  Lab 03/19/21 1331 03/19/21 1600 03/20/21 0309 03/21/21 0343  WBC 20.3* 20.4* 16.5* 17.7*  NEUTROABS 17.9* 17.6*  --  15.0*  HGB 9.0* 8.8* 8.5* 8.1*  HCT 28.0* 28.3* 26.9* 25.4*  MCV 92.1 95.6 95.1 93.7  PLT 395 395 357 625    Basic Metabolic Panel: Recent Labs  Lab 03/19/21 1331 03/19/21 1600 03/20/21 0309 03/21/21 0343  NA 134* 137 133* 138  K 4.2 4.6 4.1 3.7  CL 101 104 100 109  CO2 20* 22 23 20*  GLUCOSE 78 94 97 113*  BUN 18 21* 18 27*  CREATININE 0.74 0.72 0.78 0.69  CALCIUM 9.1 8.7* 8.4* 8.6*   GFR: Estimated Creatinine Clearance: 114.8 mL/min (by C-G formula based on SCr of 0.69 mg/dL). Recent Labs  Lab 03/19/21 1331 03/19/21 1600 03/19/21 1807 03/20/21 0309 03/21/21 0343  PROCALCITON  --   --   --  2.59 1.59  WBC 20.3* 20.4*  --  16.5* 17.7*  LATICACIDVEN  --  2.0* 1.9  --   --     Liver Function Tests: Recent Labs  Lab 03/19/21 1331 03/19/21 1600  AST 98* 90*  ALT 28 30  ALKPHOS 458* 412*  BILITOT 0.8 1.1  PROT 6.8 7.0  ALBUMIN 2.0* 2.2*   No results for input(s): LIPASE, AMYLASE in the last 168 hours. No results for input(s): AMMONIA in the last 168 hours.  ABG No results found for: PHART, PCO2ART, PO2ART, HCO3, TCO2, ACIDBASEDEF, O2SAT   Coagulation Profile: Recent Labs  Lab 03/19/21 1600 03/20/21 0309  INR 1.1 1.3*    Cardiac Enzymes: No results for input(s): CKTOTAL, CKMB, CKMBINDEX, TROPONINI in the last 168 hours.  HbA1C: Hgb A1c MFr Bld  Date/Time Value Ref Range Status  03/20/2021 03:09 AM 5.4 4.8 - 5.6 % Final    Comment:    (NOTE) Pre diabetes:           5.7%-6.4%  Diabetes:              >6.4%  Glycemic control for   <7.0% adults with diabetes   01/24/2016 10:00 AM 7.7 (H) 4.8 - 5.6 % Final    Comment:    (NOTE)         Pre-diabetes: 5.7 - 6.4         Diabetes: >6.4         Glycemic control for adults with diabetes: <7.0     CBG: Recent Labs  Lab 03/20/21 1227 03/20/21 1650 03/20/21 2041 03/21/21 0723 03/21/21 1122  GLUCAP 146* 97 122* 83 106*    Review of Systems:   Please see the history of present illness. All other systems reviewed and are negative   Past Medical History:  He,  has a past medical history of At risk for sleep apnea, ED (erectile dysfunction), GERD (gastroesophageal reflux disease), History of blood transfusion, History of CHF (congestive heart failure), History of hepatitis C, History of nephrolithiasis, History of renal disease (nephrologist-  dr Joni Fears  w/ [facility]), Hypertension, Malignant essential  hypertension, Prostate cancer (HCC) (dx'd 08/2015), Right knee meniscal tear, Type 2 diabetes mellitus (HCC), and Wears contact lenses.   Surgical History:   Past Surgical History:  Procedure Laterality Date   CATARACT EXTRACTION W/ INTRAOCULAR LENS  IMPLANT, BILATERAL  2003/  2006   EXTRACORPOREAL SHOCK WAVE LITHOTRIPSY  x5  last one 2006   KNEE ARTHROSCOPY WITH MEDIAL MENISECTOMY Right 02/08/2015   Procedure: RIGHT KNEE ARTHROSCOPY, PARTIAL MEDIAL MENISCECTOMY, CHRONDROPLASTY;  Surgeon: Eugenia Mcalpine, MD;  Location: [facility];  Service: Orthopedics;  Laterality: Right;   LYMPHADENECTOMY Bilateral 01/28/2016   Procedure: PELVIC LYMPHADENECTOMY;  Surgeon: Heloise Purpura, MD;  Location: [facility];  Service: Urology;  Laterality: Bilateral;   ORCHIECTOMY Right age 67   PERCUTANEOUS NEPHROSTOLITHOTOMY  x2  last one 1980's   PROSTATE BIOPSY     ROBOT ASSISTED LAPAROSCOPIC RADICAL PROSTATECTOMY N/A 01/28/2016   Procedure: XI ROBOTIC ASSISTED LAPAROSCOPIC RADICAL PROSTATECTOMY LEVEL 2;   Surgeon: Heloise Purpura, MD;  Location: [facility];  Service: Urology;  Laterality: N/A;   TONSILLECTOMY AND ADENOIDECTOMY  as child   TRANSTHORACIC ECHOCARDIOGRAM  12-06-2013   mild LVH,  ef 50-55%,  mild LAE and RAE,  mild dilated aortic root   TRIGGER FINGER RELEASE Bilateral last one 2013   x 2   URETEROLITHOTOMY  1990's     Social History:   reports that he has been smoking cigars. He has never used smokeless tobacco. He reports current alcohol use of about 2.0 standard drinks per week. He reports that he does not use drugs.   Family History:  His family history includes Alcohol abuse in his father; CAD in his mother; Diabetes in his brother; Marfan syndrome in his brother; Stroke in his mother.   Allergies Allergies  Allergen Reactions   Morphine And Related Nausea And Vomiting     Home Medications  Prior to Admission medications   Medication Sig Start Date End Date Taking? Authorizing Provider  amLODipine (NORVASC) 5 MG tablet Take 5 mg by mouth daily.   Yes [provider]  calcium-vitamin D (OSCAL WITH D) 500-200 MG-UNIT tablet Take 1 tablet by mouth 2 (two) times daily. 01/11/20  Yes Benjiman Core, MD  diclofenac (VOLTAREN) 75 MG EC tablet Take 1 tablet (75 mg total) by mouth 2 (two) times daily as needed. 03/08/21  Yes Johney Maine, MD  empagliflozin (JARDIANCE) 25 MG TABS tablet Take 25 mg by mouth daily.   Yes [provider]  glimepiride (AMARYL) 2 MG tablet Take 2 mg by mouth daily with breakfast.   Yes [provider]  leuprolide (LUPRON DEPOT, 85-MONTH,) 30 MG injection See admin instructions.   Yes [provider]  lisinopril (PRINIVIL,ZESTRIL) 40 MG tablet Take 1 tablet by mouth every evening.  06/12/14  Yes [provider]  OYSCO 500 + D 500-200 MG-UNIT TABS TAKE 1 TABLET BY MOUTH TWICE DAILY 08/22/20  Yes Benjiman Core, MD  Blood Glucose Monitoring Suppl (ACCU-CHEK GUIDE) w/Device KIT Use to test your blood sugar  11/25/18   [provider]  furosemide (LASIX) 20 MG tablet TAKE 1 TABLET(20 MG) BY MOUTH TWICE DAILY Patient not taking: Reported on 03/19/2021 11/05/20   Benjiman Core, MD  Lancets Micro Thin 33G MISC Use to check your blood sugar 11/25/18   [provider]  LYNPARZA 150 MG tablet TAKE 2 TABLETS BY MOUTH  TWICE DAILY SWALLOW WHOLE.  MAY TAKE WITH FOOD TO  DECREASE NAUSEA AND  VOMITING 02/22/21  Wyatt Portela, MD  methocarbamol (ROBAXIN) 500 MG tablet Take 1 tablet (500 mg total) by mouth every 8 (eight) hours as needed for muscle spasms. Patient not taking: No sig reported 06/19/20   Sandi Mealy E., PA-C  NEULASTA 6 MG/0.6ML injection INJECT SUBCUTANEOUSLY 6MG  48 HOURS AFTER EACH CHEMO  EVERY 21 DAYS 10/25/20   Wyatt Portela, MD  prochlorperazine (COMPAZINE) 10 MG tablet Take 1 tablet (10 mg total) by mouth every 6 (six) hours as needed for nausea or vomiting. Patient not taking: Reported on 03/19/2021 06/04/20   Wyatt Portela, MD     Critical care time: N/A  Caydence Enck D. Kenton Kingfisher, NP-C [facility] Personal contact information can be found on Amion  03/21/2021, 1:45 PM

## 2021-03-21 NOTE — Progress Notes (Signed)
Pt refused use of CPAP qhs.  Pt states his OSA is mild and he does not want to use is while in the hospital.

## 2021-03-22 ENCOUNTER — Telehealth: Payer: Self-pay | Admitting: Oncology

## 2021-03-22 ENCOUNTER — Inpatient Hospital Stay (HOSPITAL_COMMUNITY): Payer: 59

## 2021-03-22 DIAGNOSIS — R0603 Acute respiratory distress: Secondary | ICD-10-CM | POA: Diagnosis not present

## 2021-03-22 DIAGNOSIS — J189 Pneumonia, unspecified organism: Secondary | ICD-10-CM | POA: Diagnosis not present

## 2021-03-22 LAB — ECHOCARDIOGRAM COMPLETE
Area-P 1/2: 4.21 cm2
Height: 68 in
P 1/2 time: 457 msec
S' Lateral: 2.5 cm
Weight: 3537.94 oz

## 2021-03-22 LAB — CBC
HCT: 27.6 % — ABNORMAL LOW (ref 39.0–52.0)
Hemoglobin: 8.8 g/dL — ABNORMAL LOW (ref 13.0–17.0)
MCH: 29.8 pg (ref 26.0–34.0)
MCHC: 31.9 g/dL (ref 30.0–36.0)
MCV: 93.6 fL (ref 80.0–100.0)
Platelets: 391 10*3/uL (ref 150–400)
RBC: 2.95 MIL/uL — ABNORMAL LOW (ref 4.22–5.81)
RDW: 19.7 % — ABNORMAL HIGH (ref 11.5–15.5)
WBC: 15.4 10*3/uL — ABNORMAL HIGH (ref 4.0–10.5)
nRBC: 0 % (ref 0.0–0.2)

## 2021-03-22 LAB — BASIC METABOLIC PANEL
Anion gap: 12 (ref 5–15)
BUN: 23 mg/dL — ABNORMAL HIGH (ref 6–20)
CO2: 21 mmol/L — ABNORMAL LOW (ref 22–32)
Calcium: 8.6 mg/dL — ABNORMAL LOW (ref 8.9–10.3)
Chloride: 100 mmol/L (ref 98–111)
Creatinine, Ser: 0.7 mg/dL (ref 0.61–1.24)
GFR, Estimated: >60 mL/min (ref >60)
Glucose, Bld: 101 mg/dL — ABNORMAL HIGH (ref 70–99)
Potassium: 3.6 mmol/L (ref 3.5–5.1)
Sodium: 133 mmol/L — ABNORMAL LOW (ref 135–145)

## 2021-03-22 LAB — GLUCOSE, CAPILLARY
Glucose-Capillary: 98 mg/dL (ref 70–99)
Glucose-Capillary: 128 mg/dL — ABNORMAL HIGH (ref 70–99)

## 2021-03-22 LAB — PROCALCITONIN: Procalcitonin: 1.35 ng/mL

## 2021-03-22 LAB — D-DIMER, QUANTITATIVE: D-Dimer, Quant: >20 ug/mL-FEU — ABNORMAL HIGH (ref 0.00–0.50)

## 2021-03-22 MED ORDER — AZITHROMYCIN 250 MG PO TABS: 250.0000 mg | ORAL_TABLET | Freq: Every day | ORAL | 0 refills | Status: AC

## 2021-03-22 MED ORDER — TAMSULOSIN HCL 0.4 MG PO CAPS: 0.4000 mg | ORAL_CAPSULE | Freq: Every day | ORAL | 0 refills | Status: AC

## 2021-03-22 MED ORDER — CEFDINIR 300 MG PO CAPS: 300.0000 mg | ORAL_CAPSULE | Freq: Two times a day (BID) | ORAL | 0 refills | Status: AC

## 2021-03-22 NOTE — Discharge Summary (Signed)
Physician Discharge Summary  DMANI MIZER SWN:[id number] DOB: Nov 28, 1960 DOA: 03/19/2021  PCP: Lujean Amel, MD  Admit date: 03/19/2021 Discharge date: 03/22/2021  Admitted From: Home Disposition: Home   Recommendations for Outpatient Follow-up:  Follow up with PCP, Dr. Dorthy Cooler, and oncology, Dr. Alen Blew.  Please obtain BMP/CBC in one week Follow up cytology from pleural fluid collection 9/22.   Home Health: None Equipment/Devices: None Discharge Condition: Stable CODE STATUS: Full Diet recommendation: Heart healthy  Brief/Interim Summary: AVERILL PONS is a 60 y.o. 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 He was found to be febrile with multifocal infiltrates on CT chest with right pleural effusion. WBC 20.3k. Antibiotics and IV fluid resuscitation were administered with clinical improvement, though dyspnea worsened prompting CTA chest which showed no PE but an enlarging effusion for which pulmonology was consulted. IR performed thoracentesis with significant improvement in symptoms. It remains to be seen whether this will reaccumulate, but the patient is requesting discharge and ambulates without dyspnea or hypoxia at this time.   Discharge Diagnoses:  Active Problems:   COVID-19  Sepsis due to multifocal pneumonia: With elevated PCT, neutrophilic leukocytosis and abrupt improvement with antibiotics, as well as prior hx recent covid and elevated CT value, suspect bacterial pneumonia is primary driver of presentation. Also had fever, tachypnea, and sinus tachycardia on presentation. MRSA PCR negative.  - Continue typical antibiotics on which he has improved.  - Monitor blood and urine cultures (NGTD and nonclonal at time of discharge) - PCT  trending downward. Could recheck CBC at follow up, less reliable WBC in setting of steroids given here.     Right pleural effusion: Small-moderate on presentation, enlarging rapidly with worsening symptoms on 9/22 prompting PCCM consult and IR thoracentesis 650cc. Also gave trial of lasix with good UOP after having received >4L volume resuscitation at admission. Concern is for transudate vs. related to malignancy, less likely empyema.   Right-sided chest pain: Unclear primary cause, though considerations include enlarging effusion, possible distal PE, and healing rib fractures.  - Resolved at time of discharge.   Elevated d-dimer: LE venous U/S without thrombus. No PE noted on CTA chest to the segmental level (limited distal evaluation). D-dimer remains >20. - DC without anticoagulation with return precautions.    History of covid-19 infection: July 2022 diagnosed with home test, given paxlovid by PCP at that time with resolution of symptoms (chills only).  - Tested positive by PCR here, though CT is 38.8 and is within 90 days of positive testing. We will, therefore, discontinue isolation and covid-directed therapies.    Advanced prostate CA: Dx 2021 s/p radical prostatectomy, bilateral pelvic lymphadenectomy July 2021, then radiation Nov 2017 - Jan 2018 to prostate, pelvic LNs, prostatic fossa, and 6 months androgen deprivation. Bony involvement Dx Jan 2021, started Xtandi 137m daily, stopped Dec 2021 due to disease progression. Then taxotere chemo x9 cycles thru June 2022.  - Dr. SAlen Blewto arrange follow up for continued aggressive interventions. PSA up to 1,258. LDH grossly elevated in serum (938) and pleural fluid (305). Dr. SAlen Blewdiscussed thoracentesis with pulmonary and will reportedly plan to follow up cytology.    T2DM: HbA1c 5.4% - Continue jardiance. 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    Anemia of malignancy/chronic disease: Previous hgb baseline seems to have been ~12 in June 2022, down to 9.8 last month > 9 on arrival here, 8.8g/dl on day of discharge. - Monitor hgb.    Renal colic in patient with longstanding urolithiasis history: plain film negative, though textbook symptoms without alternative explanation by exam. Denies gross hematuria.  - Flomax to assist with passing stone (either non-radioopaque or smaller than XR sensitivity) - NSAID prn - Urology follow up.   Demand myocardial ischemia: No chest pain or ischemic ECG changes to suggests troponin elevation which is mild is due to ACS.    Obesity: Estimated body mass index is 33.99 kg/m  Discharge Instructions Discharge Instructions     Discharge instructions   Complete by: As directed    You were evaluated for pneumonia which has improved with antibiotics as well as pleural effusion (fluid around the lung) which was tapped. Studies have not all come back on the fluid at this time, though Dr. Alen Blew is aware that cytology is pending and will follow up on these results. It is unclear whether this fluid will reaccumulate, so if your shortness of breath returns/worsens, seek medical attention right away. Otherwise, take antibiotics (cefdinir for 7 days, azithromycin for 2 more days) to treat the pneumonia. No blood clot was found on full investigation, so you do not need a blood thinner at discharge.   No kidney stone was seen on the xray, though this doesn't completely rule out a stone. You can take flomax daily to help pass it. You can continue taking voltaren as needed for pain associated with this. If not taking voltaren, you can take ibuprofen 615m every 6 hours as needed. These medications should not be taken together. If symptoms worsen, seek medical attention again sooner, otherwise it is important to follow up with Dr. SAlen Blewand your primary doctor in general, but also  specifically to check on the cytology results of this pleural fluid.      Allergies as of 03/22/2021       Reactions   Morphine And Related Nausea And Vomiting        Medication List     STOP taking these medications    methocarbamol 500 MG tablet Commonly known as: Robaxin       TAKE these medications    Accu-Chek Guide w/Device Kit Use to test your blood sugar   amLODipine 5 MG tablet Commonly known as: NORVASC Take 5 mg by mouth daily.   azithromycin 250 MG tablet Commonly known as: Zithromax Z-Pak Take 1 tablet (250 mg total) by mouth daily for 2 days.   calcium-vitamin D 500-200 MG-UNIT tablet Commonly known as: OSCAL WITH D Take 1 tablet by mouth 2 (two) times daily.   cefdinir 300 MG capsule Commonly known as: OMNICEF Take 1 capsule (300 mg total) by mouth 2 (two) times daily for 7 days.   diclofenac 75 MG EC tablet Commonly known as: VOLTAREN Take 1 tablet (75 mg total) by mouth 2 (two) times daily as needed.   empagliflozin 25 MG Tabs tablet Commonly known as: JARDIANCE Take 25 mg by mouth daily.   furosemide 20 MG tablet Commonly known as: LASIX TAKE 1 TABLET(20 MG) BY MOUTH TWICE DAILY   glimepiride 2 MG tablet Commonly known as: AMARYL Take 2 mg by mouth daily with breakfast.   Lancets Micro Thin 33G Misc Use to check your  blood sugar   lisinopril 40 MG tablet Commonly known as: ZESTRIL Take 1 tablet by mouth every evening.   Lupron Depot (99-Month) 30 MG injection Generic drug: leuprolide See admin instructions.   Lynparza 150 MG tablet Generic drug: olaparib TAKE 2 TABLETS BY MOUTH  TWICE DAILY SWALLOW WHOLE.  MAY TAKE WITH FOOD TO  DECREASE NAUSEA AND  VOMITING   Neulasta 6 MG/0.6ML injection Generic drug: pegfilgrastim INJECT SUBCUTANEOUSLY 6MG  48 HOURS AFTER EACH CHEMO  EVERY 21 DAYS   OYSCO 500 + D 500-200 MG-UNIT Tabs Generic drug: Calcium Carb-Cholecalciferol TAKE 1 TABLET BY MOUTH TWICE DAILY   prochlorperazine 10  MG tablet Commonly known as: COMPAZINE Take 1 tablet (10 mg total) by mouth every 6 (six) hours as needed for nausea or vomiting.   tamsulosin 0.4 MG Caps capsule Commonly known as: FLOMAX Take 1 capsule (0.4 mg total) by mouth daily.        Follow-up Information     Koirala, [name], MD Follow up.   Specialty: Family Medicine Contact information: [place] [address] [phone]         Wyatt Portela, MD Follow up.   Specialty: Oncology Why: Schedule appointment to follow up in general and to discuss results from pleural fluid cytology (pending at time of discharge) Contact information: [address] [place] [address] [phone]                Allergies  Allergen Reactions   Morphine And Related Nausea And Vomiting    Consultations: PCCM IR  Procedures/Studies: DG Chest 1 View  Result Date: 03/21/2021 CLINICAL DATA:  Status post right thoracentesis EXAM: CHEST  1 VIEW COMPARISON:  CT from earlier in the same day. FINDINGS: No pneumothorax is noted following thoracentesis. Cardiac shadow is stable. Mild fullness is noted in the right hilar region similar to that seen on recent CT examination. No bony abnormality is noted. IMPRESSION: No pneumothorax following thoracentesis. Electronically Signed   By: Inez Catalina M.D.   On: 03/21/2021 16:44   DG Abd 1 View  Result Date: 03/22/2021 CLINICAL DATA:  Flank pain, nausea and weakness. EXAM: ABDOMEN - 1 VIEW COMPARISON:  None. FINDINGS: The bowel gas pattern is normal. Moderate stool burden noted within the colon and rectum. Healed right posterior ninth and tenth rib fractures. No radio-opaque calculi or other significant radiographic abnormality are seen. IMPRESSION: 1. Nonobstructive bowel gas pattern. 2. Moderate stool burden within the colon and rectum. Electronically Signed   By: Kerby Moors M.D.   On: 03/22/2021 07:50   CT Chest W Contrast  Result Date:  03/19/2021 CLINICAL DATA:  Suspected pneumonia, effusion or abscess. EXAM: CT CHEST WITH CONTRAST TECHNIQUE: Multidetector CT imaging of the chest was performed during intravenous contrast administration. CONTRAST:  56m OMNIPAQUE IOHEXOL 350 MG/ML SOLN COMPARISON:  None. FINDINGS: Cardiovascular: No significant vascular findings. Normal heart size with marked severity coronary artery calcification. No pericardial effusion. Mediastinum/Nodes: There is moderate severity pretracheal, AP window, subcarinal and bilateral hilar lymphadenopathy. The largest lymph node is seen within the subcarinal region and measures approximately 2.6 cm x 2.1 cm. Thyroid gland, trachea, and esophagus demonstrate no significant findings. Lungs/Pleura: A 1.1 cm noncalcified lung nodule is seen within the lateral aspect of the right upper lobe (axial CT image 35, CT series 7). An 8 mm noncalcified lung nodule is seen along the anterior aspect of the left lower lobe (axial CT image 57, CT series 7). A  7 mm noncalcified lung nodule is seen within the posterolateral aspect of the left lower lobe (axial CT image 90, CT series 7). This measures approximately 3 mm on the prior exam (image 46, CT series 2). An additional 7 mm noncalcified lung nodule is noted (axial CT image 92, CT series 7). Mild to moderate severity areas of atelectasis are seen within the bilateral upper lobes, right middle lobe and right lower lobe. There is a small to moderate sized right-sided pleural effusion. No pneumothorax is identified. Upper Abdomen: Multiple heterogeneous low-attenuation liver lesions are seen. The largest measures approximately 5.6 cm x 5.3 cm and is seen within the left lobe (axial CT image 120, CT series 2). Musculoskeletal: Multiple sclerotic areas are seen throughout the thoracic and lumbar spine. IMPRESSION: 1. Moderate severity mediastinal and bilateral hilar lymphadenopathy, with multiple bilateral noncalcified lung nodules. Findings are  concerning for metastatic disease. 2. Multiple heterogeneous low-attenuation liver lesions, concerning for metastatic disease. 3. Small to moderate sized right-sided pleural effusion. 4. Mild to moderate severity bilateral areas of atelectasis. 5. Findings likely consistent with osseous metastasis throughout the thoracic and lumbar spine. Electronically Signed   By: Virgina Norfolk M.D.   On: 03/19/2021 19:37   CT Angio Chest Pulmonary Embolism (PE) W or WO Contrast  Result Date: 03/21/2021 CLINICAL DATA:  PE suspected, low/intermediate prob, positive D-dimer hx covid and malignancy, d-dimer >20, negative LE venous U/S EXAM: CT ANGIOGRAPHY CHEST WITH CONTRAST TECHNIQUE: Multidetector CT imaging of the chest was performed using the standard protocol during bolus administration of intravenous contrast. Multiplanar 3D CT image reconstructions and MIPs were obtained to evaluate the vascular anatomy. CONTRAST:  79m OMNIPAQUE IOHEXOL 350 MG/ML SOLN COMPARISON:  CT chest 2 days prior FINDINGS: Cardiovascular: Satisfactory opacification of the pulmonary arteries to the proximal segmental level. Evaluation of the more distal pulmonary arteries is limited due to motion and contrast bolus timing. No evidence of proximal pulmonary embolism. The thoracic aorta is normal in caliber. Normal heart size. No pericardial effusion. Mediastinum/Nodes: Enlarged mediastinal and left axillary lymph nodes are again seen, similar to recent CT chest. The thyroid gland appears normal. Lungs/Pleura: Large right pleural effusion has increased in size. No pneumothorax. Predominantly peripheral patchy ground-glass opacities are similar to slightly increased. Musculoskeletal: Numerous sclerotic lesions in the vertebral bodies of the thoracic and lumbar spine and ribs, similar the previous exam. Multiple healing pathologic fractures of the ribs. Upper abdomen: Again seen are multiple low-attenuation lesions scattered throughout the liver,  which are difficult to measure definitively due to phase of contrast. IMPRESSION: 1. No evidence of pulmonary embolism within limits of the exam as described. 2. Similar to slightly worsened, predominantly peripheral, ground-glass opacities would be compatible with recent diagnosis of COVID infection versus other multifocal pneumonia. 3. Large right pleural effusion has increased in size. 4. Mediastinal and left axillary lymphadenopathy. In the setting of active infection, it is unclear if these represent reactive lymphadenopathy versus metastatic disease. Consider short-term follow-up after resolution of infectious symptoms. 5. Multiple sclerotic lesions involving the axial skeleton and bilateral ribs, along with numerous ill-defined hypodense lesions scattered throughout the liver, are compatible with history of metastatic disease. Electronically Signed   By: YAlbin FellingM.D.   On: 03/21/2021 10:48   DG Chest Port 1 View  Result Date: 03/19/2021 CLINICAL DATA:  Shortness of breath. EXAM: PORTABLE CHEST 1 VIEW COMPARISON:  October 05, 2020 FINDINGS: Mildly increased interstitial lung markings are seen. Mild areas of superimposed atelectasis and/or early infiltrate are  noted within the mid right lung and right lung base. There is no evidence of a pleural effusion or pneumothorax. The heart size and mediastinal contours are within normal limits. The visualized skeletal structures are unremarkable. IMPRESSION: Mild interstitial edema with mild areas of superimposed atelectasis and/or early infiltrate within the mid right lung and right lung base. Electronically Signed   By: Virgina Norfolk M.D.   On: 03/19/2021 17:51   VAS [place] LOWER EXTREMITY VENOUS (DVT)  Result Date: 03/20/2021  Lower Venous DVT Study Patient Name:  NYRON MOZER [facility]  Date of Exam:   03/20/2021 Medical Rec #: [id number]       Accession #:    [id number] Date of Birth: 18-Feb-1961       Patient Gender: M Patient Age:   60 years Exam Location:   [facility] Procedure:      VAS [place] LOWER EXTREMITY VENOUS (DVT) Referring Phys: Vance Gather --------------------------------------------------------------------------------  Indications: Elevated Ddimer.  Risk Factors: COVID 19 positive Cancer. Limitations: Poor ultrasound/tissue interface and body habitus. Comparison Study: No prior studies. Performing Technologist: Oliver Hum RVT  Examination Guidelines: A complete evaluation includes B-mode imaging, spectral Doppler, color Doppler, and power Doppler as needed of all accessible portions of each vessel. Bilateral testing is considered an integral part of a complete examination. Limited examinations for reoccurring indications may be performed as noted. The reflux portion of the exam is performed with the patient in reverse Trendelenburg.  +---------+---------------+---------+-----------+----------+--------------+ RIGHT    CompressibilityPhasicitySpontaneityPropertiesThrombus Aging +---------+---------------+---------+-----------+----------+--------------+ CFV      Full           Yes      Yes                                 +---------+---------------+---------+-----------+----------+--------------+ SFJ      Full                                                        +---------+---------------+---------+-----------+----------+--------------+ FV Prox  Full                                                        +---------+---------------+---------+-----------+----------+--------------+ FV Mid   Full                                                        +---------+---------------+---------+-----------+----------+--------------+ FV DistalFull                                                        +---------+---------------+---------+-----------+----------+--------------+ PFV      Full                                                         +---------+---------------+---------+-----------+----------+--------------+  POP      Full           Yes      Yes                                 +---------+---------------+---------+-----------+----------+--------------+ PTV      Full                                                        +---------+---------------+---------+-----------+----------+--------------+ PERO     Full                                                        +---------+---------------+---------+-----------+----------+--------------+   +---------+---------------+---------+-----------+----------+-------------------+ LEFT     CompressibilityPhasicitySpontaneityPropertiesThrombus Aging      +---------+---------------+---------+-----------+----------+-------------------+ CFV      Full           Yes      Yes                                      +---------+---------------+---------+-----------+----------+-------------------+ SFJ      Full                                                             +---------+---------------+---------+-----------+----------+-------------------+ FV Prox                 Yes      Yes                                      +---------+---------------+---------+-----------+----------+-------------------+ FV Mid                  Yes      Yes                                      +---------+---------------+---------+-----------+----------+-------------------+ FV Distal               Yes      Yes                                      +---------+---------------+---------+-----------+----------+-------------------+ PFV                                                   Not well visualized +---------+---------------+---------+-----------+----------+-------------------+ POP      Full           Yes      Yes                                      +---------+---------------+---------+-----------+----------+-------------------+  PTV      Full                                                              +---------+---------------+---------+-----------+----------+-------------------+ PERO                                                  Not well visualized +---------+---------------+---------+-----------+----------+-------------------+     Summary: RIGHT: - There is no evidence of deep vein thrombosis in the lower extremity.  - No cystic structure found in the popliteal fossa.  LEFT: - There is no evidence of deep vein thrombosis in the lower extremity. However, portions of this examination were limited- see technologist comments above.  - No cystic structure found in the popliteal fossa.  *See table(s) above for measurements and observations. Electronically signed by Harold Barban MD on 03/20/2021 at 7:29:55 PM.    Final    US THORACENTESIS ASP PLEURAL SPACE W/IMG GUIDE  Result Date: 03/21/2021 INDICATION: CT reveals pleural effusion, no window appreciated on floor for bedside attempt. EXAM: ULTRASOUND GUIDED DIAGNOSTIC AND THERAPEUTIC THORACENTESIS MEDICATIONS: None. COMPLICATIONS: None immediate. PROCEDURE: An ultrasound guided thoracentesis was thoroughly discussed with the patient and questions answered. The benefits, risks, alternatives and complications were also discussed. The patient understands and wishes to proceed with the procedure. Written consent was obtained. Ultrasound was performed to localize and mark an adequate pocket of fluid in the right chest. The area was then prepped and draped in the normal sterile fashion. 1% Lidocaine was used for local anesthesia. Under ultrasound guidance a 6 Fr Safe-T-Centesis catheter was introduced. Thoracentesis was performed. The catheter was removed and a dressing applied. FINDINGS: A total of approximately 650 cc of yellow fluid was removed. Samples were sent to the laboratory as requested by the clinical team. IMPRESSION: Successful ultrasound guided diagnostic and therapeutic right-sided thoracentesis  yielding 650 cc of pleural fluid. Electronically Signed   By: Jacqulynn Cadet M.D.   On: 03/21/2021 16:20    Subjective: Walking around in room, feeling anxious to go home. Uncomfortable with right flank pain that paroxysmally worsens without seeming provocation. This is typical of prior kidney stone attacks of which he's had many. No shortness of breath or chest pain.   Discharge Exam: Vitals:   03/21/21 2115 03/22/21 0601  BP: 131/73 135/68  Pulse: 91 80  Resp: 18 (!) 21  Temp: 98.8 F (37.1 C) 98.2 F (36.8 C)  SpO2: 95% 96%   General: Pt is alert, awake, not in acute distress Cardiovascular: RRR, S1/S2 +, no rubs, no gallops Respiratory: CTA bilaterally, no wheezing, no rhonchi Abdominal: Soft, NT, ND, bowel sounds + Extremities: 1+ stable edema in legs at baseline per pt, no cyanosis  Labs: BNP (last 3 results) No results for input(s): BNP in the last 8760 hours. Basic Metabolic Panel: Recent Labs  Lab 03/19/21 1331 03/19/21 1600 03/20/21 0309 03/21/21 0343 03/22/21 0332  NA 134* 137 133* 138 133*  K 4.2 4.6 4.1 3.7 3.6  CL 101 104 100 109 100  CO2 20* 22 23 20* 21*  GLUCOSE 78 94 97 113* 101*  BUN 18 21* 18 27* 23*  CREATININE 0.74 0.72 0.78 0.69 0.70  CALCIUM 9.1 8.7* 8.4* 8.6* 8.6*   Liver Function Tests: Recent Labs  Lab 03/19/21 1331 03/19/21 1600  AST 98* 90*  ALT 28 30  ALKPHOS 458* 412*  BILITOT 0.8 1.1  PROT 6.8 7.0  ALBUMIN 2.0* 2.2*   No results for input(s): LIPASE, AMYLASE in the last 168 hours. No results for input(s): AMMONIA in the last 168 hours. CBC: Recent Labs  Lab 03/19/21 1331 03/19/21 1600 03/20/21 0309 03/21/21 0343 03/22/21 0332  WBC 20.3* 20.4* 16.5* 17.7* 15.4*  NEUTROABS 17.9* 17.6*  --  15.0*  --   HGB 9.0* 8.8* 8.5* 8.1* 8.8*  HCT 28.0* 28.3* 26.9* 25.4* 27.6*  MCV 92.1 95.6 95.1 93.7 93.6  PLT 395 395 357 378 391   Cardiac Enzymes: No results for input(s): CKTOTAL, CKMB, CKMBINDEX, TROPONINI in the last 168  hours. BNP: Invalid input(s): POCBNP CBG: Recent Labs  Lab 03/21/21 1122 03/21/21 1627 03/21/21 2111 03/22/21 0724 03/22/21 1100  GLUCAP 106* 102* 95 98 128*   D-Dimer Recent Labs    03/21/21 0343 03/22/21 0332  DDIMER >20.00* >20.00*   Hgb A1c Recent Labs    03/20/21 0309  HGBA1C 5.4   Lipid Profile No results for input(s): CHOL, HDL, LDLCALC, TRIG, CHOLHDL, LDLDIRECT in the last 72 hours. Thyroid function studies No results for input(s): TSH, T4TOTAL, T3FREE, THYROIDAB in the last 72 hours.  Invalid input(s): FREET3 Anemia work up Recent Labs    03/20/21 0309  FERRITIN 3,520*   Urinalysis    Component Value Date/Time   COLORURINE YELLOW 03/19/2021 1724   APPEARANCEUR CLEAR 03/19/2021 1724   LABSPEC 1.025 03/19/2021 1724   PHURINE 6.0 03/19/2021 1724   GLUCOSEU NEGATIVE 03/19/2021 1724   HGBUR SMALL (A) 03/19/2021 1724   BILIRUBINUR NEGATIVE 03/19/2021 1724   KETONESUR NEGATIVE 03/19/2021 1724   PROTEINUR >300 (A) 03/19/2021 1724   UROBILINOGEN 0.2 12/05/2013 2348   NITRITE NEGATIVE 03/19/2021 1724   LEUKOCYTESUR NEGATIVE 03/19/2021 1724    Microbiology Recent Results (from the past 240 hour(s))  Urine Culture     Status: Abnormal   Collection Time: 03/19/21  5:24 PM   Specimen: In/Out Cath Urine  Result Value Ref Range Status   Specimen Description   Final    IN/OUT CATH URINE Performed at [facility], [place] [address], [place], [place] [address]    Special Requests   Final    NONE Performed at [facility], [place] [address], [place], [place] [address]    Culture MULTIPLE SPECIES PRESENT, SUGGEST RECOLLECTION (A)  Final   Report Status 03/21/2021 FINAL  Final  Resp Panel by RT-PCR (Flu A&B, Covid) Nasopharyngeal Swab     Status: Abnormal   Collection Time: 03/19/21  5:25 PM   Specimen: Nasopharyngeal Swab; Nasopharyngeal(NP) swabs in vial transport medium  Result Value Ref Range Status   SARS Coronavirus 2  by RT PCR POSITIVE (A) NEGATIVE Final    Comment: RESULT CALLED TO, READ BACK BY AND VERIFIED WITH: LYNCH,J. RN '@[email or web address]'  ON 03/19/2021 BY COHEN,K (NOTE) SARS-CoV-2 target nucleic acids are DETECTED.  The SARS-CoV-2 RNA is generally detectable in upper respiratory specimens during the acute phase of infection. Positive results are indicative of the presence of the identified virus, but do not rule out bacterial infection or co-infection with other pathogens not detected by the test. Clinical correlation with patient history and other diagnostic information is necessary to determine patient infection status. The expected result is  Negative.  Fact Sheet for Patients: EntrepreneurPulse.com.au  Fact Sheet for Healthcare Providers: IncredibleEmployment.be  This test is not yet approved or cleared by the [place] FDA and  has been authorized for detection and/or diagnosis of SARS-CoV-2 by FDA under an Emergency Use Authorization (EUA).  This EUA will remain in effect (meaning this te st can be used) for the duration of  the COVID-19 declaration under Section 564(b)(1) of the Act, 21 U.S.C. section 360bbb-3(b)(1), unless the authorization is terminated or revoked sooner.     Influenza A by PCR NEGATIVE NEGATIVE Final   Influenza B by PCR NEGATIVE NEGATIVE Final    Comment: (NOTE) The Xpert Xpress SARS-CoV-2/FLU/RSV plus assay is intended as an aid in the diagnosis of influenza from Nasopharyngeal swab specimens and should not be used as a sole basis for treatment. Nasal washings and aspirates are unacceptable for Xpert Xpress SARS-CoV-2/FLU/RSV testing.  Fact Sheet for Patients: EntrepreneurPulse.com.au  Fact Sheet for Healthcare Providers: IncredibleEmployment.be  This test is not yet approved or cleared by the [place] FDA and has been authorized for detection and/or diagnosis of SARS-CoV-2 by FDA  under an Emergency Use Authorization (EUA). This EUA will remain in effect (meaning this test can be used) for the duration of the COVID-19 declaration under Section 564(b)(1) of the Act, 21 U.S.C. section 360bbb-3(b)(1), unless the authorization is terminated or revoked.  Performed at [facility], [place] [address], [place], [place] [address]   MRSA Next Gen by PCR, Nasal     Status: None   Collection Time: 03/19/21  6:01 PM   Specimen: Nasal Mucosa; Nasal Swab  Result Value Ref Range Status   MRSA by PCR Next Gen NOT DETECTED NOT DETECTED Final    Comment: (NOTE) The GeneXpert MRSA Assay (FDA approved for NASAL specimens only), is one component of a comprehensive MRSA colonization surveillance program. It is not intended to diagnose MRSA infection nor to guide or monitor treatment for MRSA infections. Test performance is not FDA approved in patients less than 58 years old. Performed at [facility], [place] [address], [place], [place] [address]   Blood Culture (routine x 2)     Status: None (Preliminary result)   Collection Time: 03/19/21  7:46 PM   Specimen: BLOOD  Result Value Ref Range Status   Specimen Description   Final    BLOOD LEFT ANTECUBITAL Performed at [place] [address] [place], [place] [address]    Special Requests   Final    BOTTLES DRAWN AEROBIC AND ANAEROBIC Blood Culture adequate volume Performed at [place] [address] [place], [place] [address]    Culture   Final    NO GROWTH 2 DAYS Performed at [place] [address] [place], [place] [address]    Report Status PENDING  Incomplete  Blood Culture (routine x 2)     Status: None (Preliminary result)   Collection Time: 03/19/21  7:46 PM   Specimen: BLOOD  Result Value Ref Range Status   Specimen Description   Final    BLOOD RIGHT ANTECUBITAL Performed at [place]  [address], [place], [place] [address]    Special Requests   Final    BOTTLES DRAWN AEROBIC AND ANAEROBIC Blood Culture adequate volume Performed at [place] [address] [place],  [address]    Culture   Final    NO GROWTH 1 DAY Performed at  [facility], [place] [address] [place], [place] [address]    Report Status PENDING  Incomplete  Body fluid culture w Gram Stain     Status: None (Preliminary result)   Collection Time: 03/21/21  4:14 PM   Specimen: PATH Cytology Pleural fluid  Result Value Ref Range Status   Specimen Description   Final    PLEURAL RIGHT CHEST Performed at [place] [address], [place], [place] [address]    Special Requests   Final    NONE Performed at [facility], [place] [address], [place], [place] [address]    Gram Stain   Final    FEW SQUAMOUS EPITHELIAL CELLS PRESENT FEW WBC SEEN NO ORGANISMS SEEN Performed at [facility], [place] [address], [place], [place] [address]    Culture PENDING  Incomplete   Report Status PENDING  Incomplete    Time coordinating discharge: Approximately 40 minutes  Patrecia Pour, MD  [facility] 03/22/2021, 5:18 PM

## 2021-03-22 NOTE — Progress Notes (Signed)
Pt discharged to home in stable condition accompanied by wife.  Coolidge Breeze, RN 03/22/2021

## 2021-03-22 NOTE — Progress Notes (Signed)
Pt informed nurse that he is feeling pain that was like how it was when he had a kidney stone and feels like he has that or some other kind of stone and to let the doctor know. MD notified.

## 2021-03-22 NOTE — Telephone Encounter (Signed)
Called patient regarding upcoming appointments, patient has been called and notified. 

## 2021-03-22 NOTE — Progress Notes (Signed)
  Echocardiogram 2D Echocardiogram has been performed.  Fidel Levy 03/22/2021, 3:36 PM

## 2021-03-23 ENCOUNTER — Encounter: Payer: Self-pay | Admitting: Oncology

## 2021-03-24 ENCOUNTER — Encounter: Payer: Self-pay | Admitting: Oncology

## 2021-03-24 LAB — CULTURE, BLOOD (ROUTINE X 2)
Culture: NO GROWTH
Special Requests: ADEQUATE

## 2021-03-25 ENCOUNTER — Other Ambulatory Visit: Payer: Self-pay | Admitting: Oncology

## 2021-03-25 LAB — CULTURE, BLOOD (ROUTINE X 2)
Culture: NO GROWTH
Special Requests: ADEQUATE

## 2021-03-25 LAB — BODY FLUID CULTURE W GRAM STAIN: Culture: NO GROWTH

## 2021-03-25 MED ORDER — OXYCODONE HCL 5 MG PO TABS: 5.0000 mg | ORAL_TABLET | ORAL | 0 refills | Status: DC | PRN

## 2021-03-26 ENCOUNTER — Other Ambulatory Visit: Payer: Self-pay | Admitting: Oncology

## 2021-03-26 LAB — CYTOLOGY - NON PAP

## 2021-03-26 MED ORDER — OXYCODONE HCL 5 MG PO TABS: 5.0000 mg | ORAL_TABLET | ORAL | 0 refills | Status: DC | PRN

## 2021-04-01 ENCOUNTER — Inpatient Hospital Stay: Payer: 59

## 2021-04-01 ENCOUNTER — Other Ambulatory Visit: Payer: Self-pay

## 2021-04-01 ENCOUNTER — Inpatient Hospital Stay: Payer: 59 | Attending: Oncology | Admitting: Oncology

## 2021-04-01 VITALS — BP 146/71 | HR 100 | Temp 99.0°F | Resp 19 | Ht 68.0 in | Wt 229.1 lb

## 2021-04-01 DIAGNOSIS — D649 Anemia, unspecified: Secondary | ICD-10-CM | POA: Diagnosis not present

## 2021-04-01 DIAGNOSIS — C7951 Secondary malignant neoplasm of bone: Secondary | ICD-10-CM | POA: Insufficient documentation

## 2021-04-01 DIAGNOSIS — C61 Malignant neoplasm of prostate: Secondary | ICD-10-CM | POA: Diagnosis not present

## 2021-04-01 DIAGNOSIS — J910 Malignant pleural effusion: Secondary | ICD-10-CM | POA: Insufficient documentation

## 2021-04-01 DIAGNOSIS — J90 Pleural effusion, not elsewhere classified: Secondary | ICD-10-CM | POA: Diagnosis not present

## 2021-04-01 LAB — CBC WITH DIFFERENTIAL (CANCER CENTER ONLY)
Abs Immature Granulocytes: 0.19 10*3/uL — ABNORMAL HIGH (ref 0.00–0.07)
Basophils Absolute: 0 10*3/uL (ref 0.0–0.1)
Basophils Relative: 0 %
Eosinophils Absolute: 0 10*3/uL (ref 0.0–0.5)
Eosinophils Relative: 0 %
HCT: 29.6 % — ABNORMAL LOW (ref 39.0–52.0)
Hemoglobin: 9.2 g/dL — ABNORMAL LOW (ref 13.0–17.0)
Immature Granulocytes: 2 %
Lymphocytes Relative: 5 %
Lymphs Abs: 0.6 10*3/uL — ABNORMAL LOW (ref 0.7–4.0)
MCH: 29.3 pg (ref 26.0–34.0)
MCHC: 31.1 g/dL (ref 30.0–36.0)
MCV: 94.3 fL (ref 80.0–100.0)
Monocytes Absolute: 0.9 10*3/uL (ref 0.1–1.0)
Monocytes Relative: 7 %
Neutro Abs: 10.2 10*3/uL — ABNORMAL HIGH (ref 1.7–7.7)
Neutrophils Relative %: 86 %
Platelet Count: 286 10*3/uL (ref 150–400)
RBC: 3.14 MIL/uL — ABNORMAL LOW (ref 4.22–5.81)
RDW: 19.6 % — ABNORMAL HIGH (ref 11.5–15.5)
WBC Count: 12 10*3/uL — ABNORMAL HIGH (ref 4.0–10.5)
nRBC: 0 % (ref 0.0–0.2)

## 2021-04-01 LAB — CMP (CANCER CENTER ONLY)
ALT: 37 U/L (ref 0–44)
AST: 118 U/L — ABNORMAL HIGH (ref 15–41)
Albumin: 1.8 g/dL — ABNORMAL LOW (ref 3.5–5.0)
Alkaline Phosphatase: 589 U/L — ABNORMAL HIGH (ref 38–126)
Anion gap: 12 (ref 5–15)
BUN: 23 mg/dL — ABNORMAL HIGH (ref 6–20)
CO2: 20 mmol/L — ABNORMAL LOW (ref 22–32)
Calcium: 8.8 mg/dL — ABNORMAL LOW (ref 8.9–10.3)
Chloride: 106 mmol/L (ref 98–111)
Creatinine: 0.75 mg/dL (ref 0.61–1.24)
GFR, Estimated: >60 mL/min (ref >60)
Glucose, Bld: 108 mg/dL — ABNORMAL HIGH (ref 70–99)
Potassium: 4.1 mmol/L (ref 3.5–5.1)
Sodium: 138 mmol/L (ref 135–145)
Total Bilirubin: 0.7 mg/dL (ref 0.3–1.2)
Total Protein: 6.5 g/dL (ref 6.5–8.1)

## 2021-04-01 LAB — SAMPLE TO BLOOD BANK

## 2021-04-01 MED ORDER — FUROSEMIDE 40 MG PO TABS: 40.0000 mg | ORAL_TABLET | Freq: Every day | ORAL | 3 refills | Status: DC

## 2021-04-01 NOTE — Progress Notes (Signed)
Hematology and Oncology Follow Up Visit  Travis Bowman [id number] [birth year] 60 y.o. 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  He developed castration-resistant in 2021.   Prior Therapy:   He is status post robotic-assisted laparoscopic radical prostatectomy and bilateral extended pelvic lymphadenectomy on 01/28/2016. The right posterior soft tissue margins were positive and 2 out of 12 lymph nodes on the right side involved with cancer. 0 out of 10 lymph nodes in the left side had any cancer involvement.  He is status post radiation therapy between November 2017 in January 2018.  He had 45 Gy to the prostate and pelvic lymph nodes with additional 23.4 Gy prostatic fossa boost.  He also completed 6 months of androgen deprivation.  His PSA remained undetectable until April 2019 which was 1.38.  He was started on androgen deprivation at that time.  He developed castration-resistant disease in January 2021 after increase in his PSA with a bone scan showed limited bone disease involvement.   Xtandi 160 mg daily started in January 2021.  Therapy discontinued in December 2021 for progression of disease.  Taxotere chemotherapy given 75 mg per metered square every 3 weeks started on June 13, 2020.   He completed 9 cycles of therapy on November 28, 2020.  Lynparza 300 mg twice daily to start on January 08, 2021.  Current therapy:   Eligard 30 mg every 4 months.  Next injection will be in November 2022.  Under consideration to start salvage therapy in the near future.  Interim History: Travis Bowman presents today for a follow-up evaluation.  Since last visit, he was hospitalized between September 20 and September 23rd for symptoms of shortness of breath and dyspnea.  He was diagnosed with pneumonia and underwent thoracentesis for pleural effusion which was  malignant in etiology consistent with his prostate cancer.  Since his discharge, he reports feeling better with less dyspnea on exertion and little dyspnea.  He does have some increased fatigue and tiredness but no fevers or chills.  He still has increased lower extremity edema left more than right and feels that his shortness of breath has increased slightly.    .  Medications: Updated on review. Current Outpatient Medications  Medication Sig Dispense Refill   amLODipine (NORVASC) 5 MG tablet Take 5 mg by mouth daily.     Blood Glucose Monitoring Suppl (ACCU-CHEK GUIDE) w/Device KIT Use to test your blood sugar     calcium-vitamin D (OSCAL WITH D) 500-200 MG-UNIT tablet Take 1 tablet by mouth 2 (two) times daily. 90 tablet 3   diclofenac (VOLTAREN) 75 MG EC tablet Take 1 tablet (75 mg total) by mouth 2 (two) times daily as needed. 60 tablet 0   empagliflozin (JARDIANCE) 25 MG TABS tablet Take 25 mg by mouth daily.     furosemide (LASIX) 20 MG tablet TAKE 1 TABLET(20 MG) BY MOUTH TWICE DAILY (Patient not taking: Reported on 03/19/2021) 60 tablet 3   glimepiride (AMARYL) 2 MG tablet Take 2 mg by mouth daily with breakfast.     Lancets Micro Thin 33G MISC Use to check your blood sugar     leuprolide (LUPRON DEPOT, 11-MONTH,) 30 MG injection See admin instructions.     lisinopril (PRINIVIL,ZESTRIL) 40 MG tablet Take 1 tablet by mouth every evening.   3   LYNPARZA 150 MG tablet TAKE 2 TABLETS BY MOUTH  TWICE DAILY SWALLOW WHOLE.  MAY TAKE WITH FOOD TO  DECREASE NAUSEA AND  VOMITING 120 tablet 1   NEULASTA 6 MG/0.6ML injection INJECT SUBCUTANEOUSLY 6MG  48 HOURS AFTER EACH CHEMO  EVERY 21 DAYS 0.6 mL 3   oxyCODONE (OXY IR/ROXICODONE) 5 MG immediate release tablet Take 1 tablet (5 mg total) by mouth every 4 (four) hours as needed for severe pain. 30 tablet 0   OYSCO 500 + D 500-200 MG-UNIT TABS TAKE 1 TABLET BY MOUTH TWICE DAILY 90 tablet 6   prochlorperazine (COMPAZINE) 10 MG tablet Take 1 tablet  (10 mg total) by mouth every 6 (six) hours as needed for nausea or vomiting. (Patient not taking: Reported on 03/19/2021) 30 tablet 0   tamsulosin (FLOMAX) 0.4 MG CAPS capsule Take 1 capsule (0.4 mg total) by mouth daily. 30 capsule 0   No current facility-administered medications for this visit.     Allergies:  Allergies  Allergen Reactions   Morphine And Related Nausea And Vomiting      Physical Exam:       Blood pressure (!) 146/71, pulse 100, temperature 99 F (37.2 C), temperature source Oral, resp. rate 19, height _0  (1.727 m), weight 229 lb 1.6 oz (103.9 kg), SpO2 96 %.       ECOG: 2   General appearance: Alert, awake without any distress. Head: Atraumatic without abnormalities Oropharynx: Without any thrush or ulcers. Eyes: No scleral icterus. Lymph nodes: No lymphadenopathy noted in the cervical, supraclavicular, or axillary nodes Heart: Regular rate and rhythm without murmurs.  Left lower extremity edema up to the thigh. Lung: Decreased breath sounds on the right. Abdomin: Soft, nontender without any shifting dullness or ascites. Musculoskeletal: No clubbing or cyanosis. Neurological: No motor or sensory deficits. Skin: No rashes or lesions.              Lab Results: Lab Results  Component Value Date   WBC 15.4 (H) 03/22/2021   HGB 8.8 (L) 03/22/2021   HCT 27.6 (L) 03/22/2021   MCV 93.6 03/22/2021   PLT 391 03/22/2021     Chemistry      Component Value Date/Time   NA 133 (L) 03/22/2021 0332   K 3.6 03/22/2021 0332   CL 100 03/22/2021 0332   CO2 21 (L) 03/22/2021 0332   BUN 23 (H) 03/22/2021 0332   CREATININE 0.70 03/22/2021 0332   CREATININE 0.74 03/19/2021 1331      Component Value Date/Time   CALCIUM 8.6 (L) 03/22/2021 0332   ALKPHOS 412 (H) 03/19/2021 1600   AST 90 (H) 03/19/2021 1600   AST 98 (H) 03/19/2021 1331   ALT 30 03/19/2021 1600   ALT 28 03/19/2021 1331   BILITOT 1.1 03/19/2021 1600   BILITOT 0.8 03/19/2021  1331                    Impression and Plan:   60 year old man with:   1.  Advanced prostate cancer with disease to the bone and lymphadenopathy diagnosed in 2021.  He has castration-resistant disease at this time.  Treatment options moving forward were discussed at this time and his disease status was updated.  He has clear progression of disease based on imaging studies and PSA that is up to 1258.  He is scheduled to start Pluvecto in the near future and alternative therapy such as Jevtana chemotherapy was also discussed.  Given his quick relapse from Taxotere chemotherapy treatment with Pluvecto may be a better option.  He is agreeable to proceed at this time.  2.  Shortness of breath and fever: Related to pneumonia and pleural effusion.  His dyspnea has improved.   3.  Goals of care: Therapy is palliative at this time although aggressive measures are warranted given his age and performance status.  4.  Anemia: His hemoglobin is up to 9.2 which should be adequate to proceed with any future treatments for the time being.  5.  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  Repeat thoracentesis or Pleurx catheter may be needed in the future.  We will arrange for Pleurx catheter in the near future.  We will start him on Lasix as well to help with his overall fluid retention.   6.  Follow-up: Will be in 4 weeks to follow his progress.  30  minutes were spent on this visit.  Time was dedicated to reviewing his disease status, treatment choices and future plan of care discussion.    Zola Button, MD 10/3/20229:39 AM

## 2021-04-02 LAB — PROSTATE-SPECIFIC AG, SERUM (LABCORP): Prostate Specific Ag, Serum: 2448 ng/mL — ABNORMAL HIGH (ref 0.0–4.0)

## 2021-04-03 ENCOUNTER — Other Ambulatory Visit: Payer: Self-pay | Admitting: Oncology

## 2021-04-03 DIAGNOSIS — C61 Malignant neoplasm of prostate: Secondary | ICD-10-CM

## 2021-04-03 NOTE — Written Directive (Addendum)
MOLECULAR IMAGING AND THERAPEUTICS WRITTEN DIRECTIVE   PATIENT NAME: Travis Bowman  PT DOB:   May 13, 1961                                              MRN: [id number]  ---------------------------------------------------------------------------------------------------------------------   PLUVICTO  THERAPY   RADIOPHARMACEUTICAL: Lutetium 177 vipivotide tetraxetan (Pluvicto)     PRESCRIBED DOSE FOR ADMINISTRATION:  200 mCi   ROUTE OFADMINISTRATION:  IV   DIAGNOSIS:  Prostate cancer   REFERRING PHYSICIAN: Dr. Alen Blew   TREATMENT #:1   ADDITIONAL PHYSICIAN COMMENTS/NOTES:   AUTHORIZED USER SIGNATURE & TIME STAMP:John Melba Coon, MD   04/03/21    8:26 AM

## 2021-04-04 ENCOUNTER — Other Ambulatory Visit: Payer: Self-pay | Admitting: *Deleted

## 2021-04-04 DIAGNOSIS — C61 Malignant neoplasm of prostate: Secondary | ICD-10-CM

## 2021-04-04 DIAGNOSIS — J90 Pleural effusion, not elsewhere classified: Secondary | ICD-10-CM

## 2021-04-05 ENCOUNTER — Other Ambulatory Visit: Payer: Self-pay | Admitting: *Deleted

## 2021-04-05 ENCOUNTER — Encounter: Payer: Self-pay | Admitting: Oncology

## 2021-04-05 ENCOUNTER — Telehealth: Payer: Self-pay | Admitting: *Deleted

## 2021-04-05 DIAGNOSIS — J90 Pleural effusion, not elsewhere classified: Secondary | ICD-10-CM

## 2021-04-05 DIAGNOSIS — C61 Malignant neoplasm of prostate: Secondary | ICD-10-CM

## 2021-04-05 MED ORDER — FUROSEMIDE 40 MG PO TABS: 40.0000 mg | ORAL_TABLET | Freq: Every day | ORAL | 1 refills | Status: AC

## 2021-04-05 NOTE — Telephone Encounter (Signed)
Pt called with c/o fluid build up in abdomen and genitalia area and extremely uncomfortable to sit. Called IR to see if appt for pleural effusion could be moved up. No openings but suggested pt receive Pleural-Thora. OK from Dr.Shadad to add order for Pleural-Thora and also advised pt to begin taking Lasix 40 mg BID. Pt verbalized understanding

## 2021-04-08 ENCOUNTER — Other Ambulatory Visit: Payer: Self-pay

## 2021-04-08 ENCOUNTER — Emergency Department (HOSPITAL_COMMUNITY): Payer: 59

## 2021-04-08 ENCOUNTER — Other Ambulatory Visit: Payer: Self-pay | Admitting: Oncology

## 2021-04-08 ENCOUNTER — Encounter: Payer: Self-pay | Admitting: Oncology

## 2021-04-08 ENCOUNTER — Observation Stay (HOSPITAL_COMMUNITY)
Admission: EM | Admit: 2021-04-08 | Discharge: 2021-04-09 | Disposition: A | Discharge status: Home / Self Care | Payer: 59 | Attending: Internal Medicine | Admitting: Internal Medicine

## 2021-04-08 ENCOUNTER — Encounter (HOSPITAL_COMMUNITY): Payer: Self-pay

## 2021-04-08 DIAGNOSIS — Z885 Allergy status to narcotic agent status: Secondary | ICD-10-CM | POA: Diagnosis not present

## 2021-04-08 DIAGNOSIS — I10 Essential (primary) hypertension: Secondary | ICD-10-CM | POA: Diagnosis not present

## 2021-04-08 DIAGNOSIS — J90 Pleural effusion, not elsewhere classified: Secondary | ICD-10-CM | POA: Diagnosis present

## 2021-04-08 DIAGNOSIS — I2699 Other pulmonary embolism without acute cor pulmonale: Principal | ICD-10-CM | POA: Insufficient documentation

## 2021-04-08 DIAGNOSIS — R0602 Shortness of breath: Secondary | ICD-10-CM | POA: Diagnosis present

## 2021-04-08 DIAGNOSIS — I2693 Single subsegmental pulmonary embolism without acute cor pulmonale: Secondary | ICD-10-CM | POA: Diagnosis present

## 2021-04-08 DIAGNOSIS — Z823 Family history of stroke: Secondary | ICD-10-CM

## 2021-04-08 DIAGNOSIS — K219 Gastro-esophageal reflux disease without esophagitis: Secondary | ICD-10-CM | POA: Diagnosis present

## 2021-04-08 DIAGNOSIS — Z7984 Long term (current) use of oral hypoglycemic drugs: Secondary | ICD-10-CM | POA: Diagnosis not present

## 2021-04-08 DIAGNOSIS — C7951 Secondary malignant neoplasm of bone: Secondary | ICD-10-CM | POA: Diagnosis present

## 2021-04-08 DIAGNOSIS — E119 Type 2 diabetes mellitus without complications: Secondary | ICD-10-CM | POA: Diagnosis not present

## 2021-04-08 DIAGNOSIS — Z6834 Body mass index (BMI) 34.0-34.9, adult: Secondary | ICD-10-CM | POA: Diagnosis not present

## 2021-04-08 DIAGNOSIS — R630 Anorexia: Secondary | ICD-10-CM | POA: Diagnosis present

## 2021-04-08 DIAGNOSIS — Z20822 Contact with and (suspected) exposure to covid-19: Secondary | ICD-10-CM | POA: Diagnosis not present

## 2021-04-08 DIAGNOSIS — C786 Secondary malignant neoplasm of retroperitoneum and peritoneum: Secondary | ICD-10-CM | POA: Diagnosis present

## 2021-04-08 DIAGNOSIS — Z9841 Cataract extraction status, right eye: Secondary | ICD-10-CM | POA: Diagnosis not present

## 2021-04-08 DIAGNOSIS — R188 Other ascites: Secondary | ICD-10-CM | POA: Diagnosis present

## 2021-04-08 DIAGNOSIS — F17290 Nicotine dependence, other tobacco product, uncomplicated: Secondary | ICD-10-CM | POA: Diagnosis present

## 2021-04-08 DIAGNOSIS — M8448XA Pathological fracture, other site, initial encounter for fracture: Secondary | ICD-10-CM | POA: Diagnosis present

## 2021-04-08 DIAGNOSIS — Z8616 Personal history of COVID-19: Secondary | ICD-10-CM

## 2021-04-08 DIAGNOSIS — Z961 Presence of intraocular lens: Secondary | ICD-10-CM | POA: Diagnosis present

## 2021-04-08 DIAGNOSIS — R339 Retention of urine, unspecified: Secondary | ICD-10-CM | POA: Diagnosis present

## 2021-04-08 DIAGNOSIS — Z9842 Cataract extraction status, left eye: Secondary | ICD-10-CM

## 2021-04-08 DIAGNOSIS — I82412 Acute embolism and thrombosis of left femoral vein: Secondary | ICD-10-CM | POA: Diagnosis present

## 2021-04-08 DIAGNOSIS — C787 Secondary malignant neoplasm of liver and intrahepatic bile duct: Secondary | ICD-10-CM | POA: Diagnosis present

## 2021-04-08 DIAGNOSIS — Z79899 Other long term (current) drug therapy: Secondary | ICD-10-CM | POA: Diagnosis not present

## 2021-04-08 DIAGNOSIS — C779 Secondary and unspecified malignant neoplasm of lymph node, unspecified: Secondary | ICD-10-CM | POA: Diagnosis present

## 2021-04-08 DIAGNOSIS — E11649 Type 2 diabetes mellitus with hypoglycemia without coma: Secondary | ICD-10-CM | POA: Diagnosis present

## 2021-04-08 DIAGNOSIS — C61 Malignant neoplasm of prostate: Secondary | ICD-10-CM | POA: Diagnosis present

## 2021-04-08 DIAGNOSIS — Z8249 Family history of ischemic heart disease and other diseases of the circulatory system: Secondary | ICD-10-CM

## 2021-04-08 DIAGNOSIS — Z833 Family history of diabetes mellitus: Secondary | ICD-10-CM

## 2021-04-08 DIAGNOSIS — F17210 Nicotine dependence, cigarettes, uncomplicated: Secondary | ICD-10-CM | POA: Insufficient documentation

## 2021-04-08 LAB — CBC WITH DIFFERENTIAL/PLATELET
Abs Immature Granulocytes: 0.17 10*3/uL — ABNORMAL HIGH (ref 0.00–0.07)
Basophils Absolute: 0 10*3/uL (ref 0.0–0.1)
Basophils Relative: 0 %
Eosinophils Absolute: 0 10*3/uL (ref 0.0–0.5)
Eosinophils Relative: 0 %
HCT: 31.7 % — ABNORMAL LOW (ref 39.0–52.0)
Hemoglobin: 9.6 g/dL — ABNORMAL LOW (ref 13.0–17.0)
Immature Granulocytes: 1 %
Lymphocytes Relative: 4 %
Lymphs Abs: 0.6 10*3/uL — ABNORMAL LOW (ref 0.7–4.0)
MCH: 29 pg (ref 26.0–34.0)
MCHC: 30.3 g/dL (ref 30.0–36.0)
MCV: 95.8 fL (ref 80.0–100.0)
Monocytes Absolute: 0.9 10*3/uL (ref 0.1–1.0)
Monocytes Relative: 6 %
Neutro Abs: 13.3 10*3/uL — ABNORMAL HIGH (ref 1.7–7.7)
Neutrophils Relative %: 89 %
Platelets: 317 10*3/uL (ref 150–400)
RBC: 3.31 MIL/uL — ABNORMAL LOW (ref 4.22–5.81)
RDW: 19.5 % — ABNORMAL HIGH (ref 11.5–15.5)
WBC: 15.1 10*3/uL — ABNORMAL HIGH (ref 4.0–10.5)
nRBC: 0 % (ref 0.0–0.2)

## 2021-04-08 LAB — TROPONIN I (HIGH SENSITIVITY)
Troponin I (High Sensitivity): 26 ng/L — ABNORMAL HIGH (ref <18)
Troponin I (High Sensitivity): 24 ng/L — ABNORMAL HIGH (ref <18)

## 2021-04-08 LAB — COMPREHENSIVE METABOLIC PANEL
ALT: 25 U/L (ref 0–44)
AST: 97 U/L — ABNORMAL HIGH (ref 15–41)
Albumin: 2 g/dL — ABNORMAL LOW (ref 3.5–5.0)
Alkaline Phosphatase: 580 U/L — ABNORMAL HIGH (ref 38–126)
Anion gap: 12 (ref 5–15)
BUN: 30 mg/dL — ABNORMAL HIGH (ref 6–20)
CO2: 21 mmol/L — ABNORMAL LOW (ref 22–32)
Calcium: 9 mg/dL (ref 8.9–10.3)
Chloride: 108 mmol/L (ref 98–111)
Creatinine, Ser: 0.59 mg/dL — ABNORMAL LOW (ref 0.61–1.24)
GFR, Estimated: >60 mL/min (ref >60)
Glucose, Bld: 59 mg/dL — ABNORMAL LOW (ref 70–99)
Potassium: 4.2 mmol/L (ref 3.5–5.1)
Sodium: 141 mmol/L (ref 135–145)
Total Bilirubin: 0.9 mg/dL (ref 0.3–1.2)
Total Protein: 7.3 g/dL (ref 6.5–8.1)

## 2021-04-08 LAB — CBG MONITORING, ED
Glucose-Capillary: 43 mg/dL — CL (ref 70–99)
Glucose-Capillary: 113 mg/dL — ABNORMAL HIGH (ref 70–99)

## 2021-04-08 LAB — LIPASE, BLOOD: Lipase: 29 U/L (ref 11–51)

## 2021-04-08 LAB — RESP PANEL BY RT-PCR (FLU A&B, COVID) ARPGX2
Influenza A by PCR: NEGATIVE
Influenza B by PCR: NEGATIVE
SARS Coronavirus 2 by RT PCR: NEGATIVE

## 2021-04-08 LAB — MRSA NEXT GEN BY PCR, NASAL: MRSA by PCR Next Gen: NOT DETECTED

## 2021-04-08 LAB — GLUCOSE, CAPILLARY: Glucose-Capillary: 101 mg/dL — ABNORMAL HIGH (ref 70–99)

## 2021-04-08 LAB — HEPARIN LEVEL (UNFRACTIONATED): Heparin Unfractionated: 0.19 IU/mL — ABNORMAL LOW (ref 0.30–0.70)

## 2021-04-08 MED ORDER — AMLODIPINE BESYLATE 5 MG PO TABS
5.0000 mg | ORAL_TABLET | Freq: Every day | ORAL | Status: DC
  Administered 2021-04-09: 5 mg via ORAL
  Filled 2021-04-08: qty 1

## 2021-04-08 MED ORDER — MELATONIN 3 MG PO TABS
3.0000 mg | ORAL_TABLET | Freq: Every day | ORAL | Status: DC
  Administered 2021-04-08: 3 mg via ORAL

## 2021-04-08 MED ORDER — IOHEXOL 350 MG/ML SOLN
100.0000 mL | Freq: Once | INTRAVENOUS | Status: AC | PRN
  Administered 2021-04-08: 79 mL via INTRAVENOUS

## 2021-04-08 MED ORDER — SODIUM CHLORIDE 0.9 % IV SOLN
500.0000 mg | INTRAVENOUS | Status: DC
  Administered 2021-04-08: 500 mg via INTRAVENOUS
  Filled 2021-04-08: qty 500

## 2021-04-08 MED ORDER — DICLOFENAC SODIUM 75 MG PO TBEC
75.0000 mg | DELAYED_RELEASE_TABLET | Freq: Two times a day (BID) | ORAL | Status: DC | PRN
  Administered 2021-04-08: 75 mg via ORAL
  Filled 2021-04-08 (×2): qty 1

## 2021-04-08 MED ORDER — OXYCODONE HCL 5 MG PO TABS: 5.0000 mg | ORAL_TABLET | ORAL | Status: DC | PRN

## 2021-04-08 MED ORDER — HEPARIN (PORCINE) 25000 UT/250ML-% IV SOLN
1950.0000 [IU]/h | INTRAVENOUS | Status: DC
  Administered 2021-04-08: 1750 [IU]/h via INTRAVENOUS
  Administered 2021-04-08: 1500 [IU]/h via INTRAVENOUS
  Filled 2021-04-08 (×2): qty 250

## 2021-04-08 MED ORDER — INSULIN ASPART 100 UNIT/ML IJ SOLN
0.0000 [IU] | Freq: Every day | INTRAMUSCULAR | Status: DC
Start: 2021-04-08 — End: 2021-04-09

## 2021-04-08 MED ORDER — INSULIN ASPART 100 UNIT/ML IJ SOLN
0.0000 [IU] | Freq: Three times a day (TID) | INTRAMUSCULAR | Status: DC
Start: 2021-04-09 — End: 2021-04-09

## 2021-04-08 MED ORDER — LISINOPRIL 10 MG PO TABS
20.0000 mg | ORAL_TABLET | Freq: Every evening | ORAL | Status: DC
  Administered 2021-04-08: 20 mg via ORAL

## 2021-04-08 MED ORDER — SODIUM CHLORIDE 0.9 % IV SOLN
1.0000 g | INTRAVENOUS | Status: DC
  Administered 2021-04-08 – 2021-04-09 (×2): 1 g via INTRAVENOUS
  Filled 2021-04-08 (×2): qty 10

## 2021-04-08 MED ORDER — HYDROCOD POLST-CPM POLST ER 10-8 MG/5ML PO SUER
5.0000 mL | Freq: Once | ORAL | Status: DC
Start: 2021-04-08 — End: 2021-04-09

## 2021-04-08 MED ORDER — HEPARIN BOLUS VIA INFUSION
2000.0000 [IU] | Freq: Once | INTRAVENOUS | Status: AC
  Administered 2021-04-08: 2000 [IU] via INTRAVENOUS
  Filled 2021-04-08: qty 2000

## 2021-04-08 MED ORDER — ACETAMINOPHEN 325 MG PO TABS
650.0000 mg | ORAL_TABLET | ORAL | Status: AC | PRN
  Administered 2021-04-08: 650 mg via ORAL
  Filled 2021-04-08: qty 2

## 2021-04-08 MED ORDER — HEPARIN BOLUS VIA INFUSION
4000.0000 [IU] | Freq: Once | INTRAVENOUS | Status: AC
  Administered 2021-04-08: 4000 [IU] via INTRAVENOUS
  Filled 2021-04-08: qty 4000

## 2021-04-08 MED ORDER — CHLORHEXIDINE GLUCONATE CLOTH 2 % EX PADS
6.0000 | MEDICATED_PAD | Freq: Every day | CUTANEOUS | Status: DC
  Administered 2021-04-08: 6 via TOPICAL

## 2021-04-08 NOTE — Progress Notes (Signed)
ANTICOAGULATION CONSULT NOTE   Pharmacy Consult for Heparin Indication: pulmonary embolus  Allergies  Allergen Reactions   Morphine And Related Nausea And Vomiting    Patient Measurements: Height: 5\' 8"  (172.7 cm) Weight: 103.4 kg (228 lb) IBW/kg (Calculated) : 68.4 Heparin Dosing Weight: 90.9 kg  Vital Signs: Temp: 97.5 F (36.4 C) (10/10 1254) Temp Source: Oral (10/10 1254) BP: 122/73 (10/10 1430) Pulse Rate: 89 (10/10 1430)  Labs: Recent Labs    04/08/21 1139 04/08/21 1311  HGB 9.6*  --   HCT 31.7*  --   PLT 317  --   CREATININE 0.59*  --   TROPONINIHS 26* 24*    Estimated Creatinine Clearance: 115.9 mL/min (A) (by C-G formula based on SCr of 0.59 mg/dL (L)).   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  CTa with R-sided PE, no apparent R heart strain.   Goal of Therapy:  Heparin  level 0.3-0.7 units/ml Monitor platelets by anticoagulation protocol: Yes   Plan:  Heparin bolus 4000 units, infusion at 1500 units/hr 1st Heparin level at 6 hr after start of Heparin Daily CBC, order daily Hep level at steady state   Cobie Leidner L Pharm D 04/08/2021,2:57 PM

## 2021-04-08 NOTE — ED Triage Notes (Signed)
Patient c/o swelling in his abdomen, testicles and legs x 2-3 weeks, but much worse now. Patient states he is SOB. Patient states he is not currently taking chemo.

## 2021-04-08 NOTE — ED Provider Notes (Signed)
Emergency Medicine Provider Triage Evaluation Note  Travis Bowman , a 60 y.o. male  was evaluated in triage.  Pt complains of this of breath and abdominal swelling.  States that he has had increasing shortness of breath over the past week.  He was recently seen and admitted for pneumonia and discharged.  States that he got better and then worse.  He also has new abdominal swelling and bloated feeling which she says has never happened before.  Endorses passing flatus.  Has had bowel movements including this morning.  Denies blood in stool.  He denies fevers, abdominal pain, nausea or vomiting.  He has a history of metastatic prostate cancer with bone and lymphadenopathy mets.  He sees Dr. Alen Blew at [facility].  Review of Systems  Positive: Abdominal swelling, shortness of breath Negative: Fever  Physical Exam  BP 118/73 (BP Location: Left Arm)   Pulse 89   Temp 97.7 F (36.5 C) (Oral)   Resp (!) 24   Ht 5\' 8"  (1.727 m)   Wt 103.4 kg   SpO2 93%   BMI 34.67 kg/m  Gen:   Awake, no distress   Resp:  Short of breath at rest, decreased lung sounds on the right MSK:   Moves extremities without difficulty  Other:  S1-S2 without murmurs, pulses equal bilaterally, abdomen is distended without tenderness. + Bowel sounds.  Bilateral lower extremities with edema.  He does appear somewhat jaundiced on my exam however I have not taken care of him before and have no prior comparison.  Medical Decision Making  Medically screening exam initiated at 10:32 AM.  Appropriate orders placed.  [place] Mitch Wyffels was informed that the remainder of the evaluation will be completed by another provider, this initial triage assessment does not replace that evaluation, and the importance of remaining in the ED until their evaluation is complete.     Mickie Hillier, PA-C 04/08/21 1037    Truddie Hidden, MD 04/08/21 [phone]

## 2021-04-08 NOTE — H&P (Addendum)
History and Physical    Travis Bowman UMP:[id number] DOB: 1961-05-19 DOA: 04/08/2021  PCP: Lujean Amel, MD   Chief Complaint: Shortness of breath  HPI: Travis Bowman is a 60 y.o. male with medical history significant of hypertension, type 2 diabetes non-insulin-dependent and metastatic prostate cancer who follows Dr. Alen Blew status post robotic assisted laparoscopic radical prostatectomy and bilateral extended pelvic lymphadenectomy 2017 status post radiation, androgen deprivation with notable bone mets as early as January 2021 with failure of Xtandi having completed Taxotere June 2022 transition to [place] and currently on Eligard every 4 months under consideration for salvage therapy who presents with worsening dyspnea on exertion, shortness of breath, worsening fluid retention.  Patient evaluated in the ED with CT chest abdomen pelvis remarkable for acute lobar and subsegmental pulmonary emboli on the right with no signs of heart strain, widespread metastatic disease to the lymph nodes of the chest abdomen pelvis as well as multiple pathological fractures of ribs pubic rami with notable ascites and a moderate pleural effusion.  Given patient's acute respiratory failure, acute PE(provoked in the setting of metastatic disease) patient will be admitted to the hospital for IV anticoagulation and further consideration of treatment with the assistance of oncology Dr. Alen Blew.  Of note patient was recently evaluated and treated for acute multifocal pneumonia secondary to COVID-19 on September 20.  Review of Systems: As per HPI otherwise denies nausea vomiting diarrhea constipation headache fevers chills cough .  Assessment/Plan  Acute respiratory failure without hypoxia in the setting of acute provoked PE -Heparin drip initiated, follow along with oncology, given provoked nature in the setting of metastatic disease would likely transition to [place] pending recommendations from oncology -Chest pain  already resolved, without hypoxia - no indication for echo (last echo 03/22/21)  History of castration resistant metastatic prostate cancer, ongoing -Follows with Dr Alen Blew - plan for next injection 04/18/21 per our discussion at bedside with patient and wife.  Hypertension -Resume home meds  Non-insulin-dependent diabetes type 2, well controlled -Continue sliding scale insulin/hypoglycemic protocol  Recent Covid pneumonia -Completed quarantine protocol -CT shows resolving pleural effusion - likely parapneumonic in origin  DVT prophylaxis: Heparin gtt  Code Status: Full  Family Communication: Wife at bedside  Status is: Inpt  Dispo: The patient is from: Home              Anticipated d/c is to: Home              Anticipated d/c date is: 24-48h              Patient currently not medically stable for discharge   Consultants:  Heme-Onc  Procedures:  None   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   History of nephrolithiasis    History of renal disease nephrologist-  dr Joni Fears  w/ [facility]   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 May 2015--  renal function recovered   Hypertension    Malignant essential hypertension  Prostate cancer ([place]) dx'd 08/2015   Right knee meniscal tear    Type 2 diabetes mellitus ([place])    Wears contact lenses     Past Surgical History:  Procedure Laterality Date   CATARACT EXTRACTION W/ INTRAOCULAR LENS  IMPLANT, BILATERAL  2003/  2006   EXTRACORPOREAL SHOCK WAVE LITHOTRIPSY  x5  last one 2006   KNEE ARTHROSCOPY WITH MEDIAL  MENISECTOMY Right 02/08/2015   Procedure: RIGHT KNEE ARTHROSCOPY, PARTIAL MEDIAL MENISCECTOMY, CHRONDROPLASTY;  Surgeon: Sydnee Cabal, MD;  Location: [facility];  Service: Orthopedics;  Laterality: Right;   LYMPHADENECTOMY Bilateral 01/28/2016   Procedure: PELVIC LYMPHADENECTOMY;  Surgeon: Raynelle Bring, MD;  Location: [facility];  Service: Urology;  Laterality: Bilateral;   ORCHIECTOMY Right age 37   PERCUTANEOUS NEPHROSTOLITHOTOMY  x2  last one 44's   PROSTATE BIOPSY     ROBOT ASSISTED LAPAROSCOPIC RADICAL PROSTATECTOMY N/A 01/28/2016   Procedure: XI ROBOTIC ASSISTED LAPAROSCOPIC RADICAL PROSTATECTOMY LEVEL 2;  Surgeon: Raynelle Bring, MD;  Location: [facility];  Service: Urology;  Laterality: N/A;   TONSILLECTOMY AND ADENOIDECTOMY  as child   TRANSTHORACIC ECHOCARDIOGRAM  12-06-2013   mild LVH,  ef 50-55%,  mild LAE and RAE,  mild dilated aortic root   TRIGGER FINGER RELEASE Bilateral last one 2013   x 2   URETEROLITHOTOMY  1990's     reports that he has been smoking cigars. He has never used smokeless tobacco. He reports current alcohol use of about 2.0 standard drinks per week. He reports that he does not use drugs.  Allergies  Allergen Reactions   Morphine And Related Nausea And Vomiting    Family History  Problem Relation Age of Onset   Stroke Mother    CAD Mother    Alcohol abuse Father    Diabetes Brother    Marfan syndrome Brother     Prior to Admission medications   Medication Sig Start Date End Date Taking? Authorizing Provider  amLODipine (NORVASC) 5 MG tablet Take 5 mg by mouth daily.   Yes [provider]  calcium-vitamin D (OSCAL WITH D) 500-200 MG-UNIT tablet Take 1 tablet by mouth 2 (two) times daily. 01/11/20  Yes Wyatt Portela, MD  diclofenac (VOLTAREN) 75 MG EC tablet Take 1 tablet (75 mg total) by mouth 2 (two) times daily as needed. Patient taking differently: Take 75 mg by mouth 2 (two) times daily as needed for moderate pain. 03/08/21   Yes Brunetta Genera, MD  Blood Glucose Monitoring Suppl (ACCU-CHEK GUIDE) w/Device KIT Use to test your blood sugar 11/25/18   [provider]  furosemide (LASIX) 20 MG tablet TAKE 1 TABLET(20 MG) BY MOUTH TWICE DAILY Patient not taking: Reported on 03/19/2021 11/05/20   Wyatt Portela, MD  furosemide (LASIX) 40 MG tablet Take 1 tablet (40 mg total) by mouth daily. 04/01/21   Wyatt Portela, MD  furosemide (LASIX) 40 MG tablet Take 1 tablet (40 mg total) by mouth daily. 04/05/21   Wyatt Portela, MD  glimepiride (AMARYL) 2 MG tablet Take 2 mg by mouth daily with breakfast.    [provider]  Lancets Micro Thin 33G MISC Use to check your blood sugar 11/25/18   [provider]  leuprolide (LUPRON DEPOT, 60-MONTH,) 30 MG injection See admin instructions.    [provider]  lisinopril (PRINIVIL,ZESTRIL) 40 MG tablet Take 1 tablet by mouth every evening.  06/12/14   [provider]  LYNPARZA 150 MG tablet TAKE 2 TABLETS  BY MOUTH  TWICE DAILY SWALLOW WHOLE.  MAY TAKE WITH FOOD TO  DECREASE NAUSEA AND  VOMITING Patient not taking: Reported on 04/08/2021 02/22/21   Wyatt Portela, MD  NEULASTA 6 MG/0.6ML injection INJECT SUBCUTANEOUSLY 6MG  48 HOURS AFTER EACH CHEMO  EVERY 21 DAYS Patient not taking: Reported on 04/08/2021 10/25/20   Wyatt Portela, MD  oxyCODONE (OXY IR/ROXICODONE) 5 MG immediate release tablet Take 1 tablet (5 mg total) by mouth every 4 (four) hours as needed for severe pain. 03/26/21   Wyatt Portela, MD  OYSCO 500 + D 500-200 MG-UNIT TABS TAKE 1 TABLET BY MOUTH TWICE DAILY 08/22/20   Wyatt Portela, MD  prochlorperazine (COMPAZINE) 10 MG tablet Take 1 tablet (10 mg total) by mouth every 6 (six) hours as needed for nausea or vomiting. Patient not taking: Reported on 03/19/2021 06/04/20   Wyatt Portela, MD  tamsulosin (FLOMAX) 0.4 MG CAPS capsule Take 1 capsule (0.4 mg total) by mouth daily. 03/22/21   Patrecia Pour, MD    Physical  Exam: Vitals:   04/08/21 1352 04/08/21 1400 04/08/21 1415 04/08/21 1430  BP: 134/75 129/77 116/77 122/73  Pulse: 94 92 90 89  Resp: (!) 29 (!) 31 (!) 23 (!) 33  Temp:      TempSrc:      SpO2: 96% 94% 94% 97%  Weight:      Height:        Constitutional: NAD, calm, comfortable Vitals:   04/08/21 1352 04/08/21 1400 04/08/21 1415 04/08/21 1430  BP: 134/75 129/77 116/77 122/73  Pulse: 94 92 90 89  Resp: (!) 29 (!) 31 (!) 23 (!) 33  Temp:      TempSrc:      SpO2: 96% 94% 94% 97%  Weight:      Height:       General:  Pleasantly resting in bed, No acute distress. HEENT:  Normocephalic atraumatic.  Sclerae nonicteric, noninjected.  Extraocular movements intact bilaterally. Neck:  Without mass or deformity.  Trachea is midline. Lungs:  Clear to auscultate bilaterally without rhonchi, wheeze, or rales. Heart:  Regular rate and rhythm.  Without murmurs, rubs, or gallops. Abdomen:  Soft, nontender, nondistended.  Without guarding or rebound. Extremities: Without cyanosis, clubbing, edema, or obvious deformity. Vascular:  Dorsalis pedis and posterior tibial pulses palpable bilaterally. Skin:  Warm and dry, no erythema, no ulcerations.  Labs on Admission: I have personally reviewed following labs and imaging studies  CBC: Recent Labs  Lab 04/08/21 1139  WBC 15.1*  NEUTROABS 13.3*  HGB 9.6*  HCT 31.7*  MCV 95.8  PLT 325   Basic Metabolic Panel: Recent Labs  Lab 04/08/21 1139  NA 141  K 4.2  CL 108  CO2 21*  GLUCOSE 59*  BUN 30*  CREATININE 0.59*  CALCIUM 9.0   GFR: Estimated Creatinine Clearance: 115.9 mL/min (A) (by C-G formula based on SCr of 0.59 mg/dL (L)). Liver Function Tests: Recent Labs  Lab 04/08/21 1139  AST 97*  ALT 25  ALKPHOS 580*  BILITOT 0.9  PROT 7.3  ALBUMIN 2.0*   Recent Labs  Lab 04/08/21 1139  LIPASE 29   No results for input(s): AMMONIA in the last 168 hours. Coagulation Profile: No results for input(s): INR, PROTIME in the last  168 hours. Cardiac Enzymes: No results for input(s): CKTOTAL, CKMB, CKMBINDEX, TROPONINI in the last 168 hours. BNP (last 3 results) No results for input(s): PROBNP in the last 8760 hours. HbA1C: No results  for input(s): HGBA1C in the last 72 hours. CBG: Recent Labs  Lab 04/08/21 1502  GLUCAP 43*   Lipid Profile: No results for input(s): CHOL, HDL, LDLCALC, TRIG, CHOLHDL, LDLDIRECT in the last 72 hours. Thyroid Function Tests: No results for input(s): TSH, T4TOTAL, FREET4, T3FREE, THYROIDAB in the last 72 hours. Anemia Panel: No results for input(s): VITAMINB12, FOLATE, FERRITIN, TIBC, IRON, RETICCTPCT in the last 72 hours. Urine analysis:    Component Value Date/Time   COLORURINE YELLOW 03/19/2021 1724   APPEARANCEUR CLEAR 03/19/2021 1724   LABSPEC 1.025 03/19/2021 1724   PHURINE 6.0 03/19/2021 1724   GLUCOSEU NEGATIVE 03/19/2021 1724   HGBUR SMALL (A) 03/19/2021 1724   BILIRUBINUR NEGATIVE 03/19/2021 1724   KETONESUR NEGATIVE 03/19/2021 1724   PROTEINUR >300 (A) 03/19/2021 1724   UROBILINOGEN 0.2 12/05/2013 2348   NITRITE NEGATIVE 03/19/2021 1724   LEUKOCYTESUR NEGATIVE 03/19/2021 1724    Radiological Exams on Admission: DG Chest 2 View  Result Date: 04/08/2021 CLINICAL DATA:  Shortness of breath. EXAM: CHEST - 2 VIEW COMPARISON:  03/21/2021 FINDINGS: Patchy bilateral airspace disease noted right mid lung and left mid and lower lung. Stable asymmetric elevation right hemidiaphragm. Tiny right pleural effusion. Cardiopericardial silhouette is at upper limits of normal for size. Telemetry leads overlie the chest. IMPRESSION: Patchy bilateral airspace disease with tiny right pleural effusion. Multifocal pneumonia concern. Electronically Signed   By: Misty Stanley M.D.   On: 04/08/2021 11:01   CT Angio Chest PE W/Cm &/Or Wo Cm  Result Date: 04/08/2021 CLINICAL DATA:  Shortness of breath with abdominal and scrotal swelling for 2-3 weeks. History of prostate cancer. Clinical  concern for pulmonary embolism. EXAM: CT ANGIOGRAPHY CHEST CT ABDOMEN AND PELVIS WITH CONTRAST TECHNIQUE: Multidetector CT imaging of the chest was performed using the standard protocol during bolus administration of intravenous contrast. Multiplanar CT image reconstructions and MIPs were obtained to evaluate the vascular anatomy. Multidetector CT imaging of the abdomen and pelvis was performed using the standard protocol during bolus administration of intravenous contrast. CONTRAST:  76m OMNIPAQUE IOHEXOL 350 MG/ML SOLN COMPARISON:  Chest CTA 03/21/2021. Chest CT 03/19/2021, PET-CT 12/19/2020 and abdominal CT 06/01/2020. FINDINGS: CTA CHEST FINDINGS Cardiovascular: The pulmonary arteries are suboptimally opacified with contrast to the level of the subsegmental branches. There is probable acute nonocclusive thrombus within the main and upper lobe lobar branches of the right pulmonary artery. In addition, there are probable small emboli within segmental branches of the right lower lobe. No definite left-sided emboli are seen. No significant right ventricular dilatation the heart size is normal. There is no significant pericardial effusion. Atherosclerosis of the aorta, great vessels and coronary arteries noted. Mediastinum/Nodes: Again demonstrated are multiple enlarged mediastinal, hilar and left supraclavicular lymph nodes. There is a 1.5 cm AP window node on image 28/4 and a 2.4 cm subcarinal node on image 38/4. There are small left axillary lymph nodes. Overall, this thoracic adenopathy appears similar to recent previous study. The thyroid gland, trachea and esophagus demonstrate no significant findings. Lungs/Pleura: Moderate-sized dependent right pleural effusion has mildly decreased in volume compared with the recent prior study. No pneumothorax or significant left pleural effusion. There is stable atelectasis at the right lung base. Underlying emphysematous changes and diffuse subpleural reticulation are  noted. There is increased ill-defined nodularity peripherally in the right upper lobe with a component measuring up to 1.4 cm on image 35/6. There is additional nodularity in the left lower lobe, measuring up to 9 mm on image 60/6. These nodules  have enlarged compared with older prior studies and are suspicious for metastases. Musculoskeletal/Chest wall: Widespread osseous metastatic disease to the ribs and thoracic spine with multiple pathologic rib fractures, similar to recent CT. No significant pathologic fracture or epidural tumor identified in the spine. CT ABDOMEN AND PELVIS FINDINGS Hepatobiliary: Widespread hepatic metastatic disease is again noted with approximately 50% replacement of the normal hepatic parenchyma. This appears similar to the recent chest CT and is new compared with the abdominal CT from 06/01/2020. The liver is mildly enlarged. Possible small calcified gallstone. No significant gallbladder wall thickening or biliary dilatation. Pancreas: Unremarkable. No pancreatic ductal dilatation or surrounding inflammatory changes. Spleen: Normal in size without focal abnormality. Adrenals/Urinary Tract: Both adrenal glands appear normal. Several low-density renal lesions are similar to prior abdominal CT and likely cysts. No definite enhancing renal mass. No evidence of urinary tract calculus or hydronephrosis. The bladder appears unchanged. Stomach/Bowel: There may be a small amount of enteric contrast within the small bowel. No evidence of bowel distension, wall thickening or focal surrounding inflammation. The appendix appears normal. Vascular/Lymphatic: There are multiple enlarged retroperitoneal and pelvic lymph nodes, including a 3.1 cm portacaval node on image 41/2, a 1.7 cm right pericaval node on image 42/2 and a 1.6 cm left inguinal node on image 88/2. Aortic and branch vessel atherosclerosis without aneurysm or large vessel occlusion. No venous thrombosis identified. Reproductive: Status  post prostatectomy without recurrent mass lesion. Other: Interval development of ascites with diffuse subcutaneous edema. There are multiple peritoneal and omental soft tissue nodules consistent with widespread peritoneal metastatic disease. Representative lesions include a 5.9 x 2.0 cm right upper quadrant peritoneal nodule on image 46/2 and a central omental implant measuring 5.8 x 2.5 cm on image 61/2. Musculoskeletal: Widespread osseous metastatic disease with pathologic fracture involving the left pubic rami. No pathologic fracture or significant epidural tumor seen in the lumbar spine. Review of the MIP images confirms the above findings. IMPRESSION: 1. Study is positive for lobar and segmental pulmonary emboli on the right. No signs of right heart strain. 2. Widespread metastatic disease to multiple lymph nodes in the chest, abdomen and pelvis, the liver, the peritoneum and multiple bones. There are pathologic fractures of multiple ribs and the left pubic rami. Ascites with multiple peritoneal metastases. 3. No evidence of hydronephrosis or bowel obstruction. 4. Moderate size right pleural effusion has mildly decreased in size compared with recent CT. 5. Critical Value/emergent results were called by telephone at the time of interpretation on 04/08/2021 at 2:40 pm to provider Vivi Martens, who verbally acknowledged these results. Electronically Signed   By: Richardean Sale M.D.   On: 04/08/2021 14:41   CT Abdomen Pelvis W Contrast  Result Date: 04/08/2021 CLINICAL DATA:  Shortness of breath with abdominal and scrotal swelling for 2-3 weeks. History of prostate cancer. Clinical concern for pulmonary embolism. EXAM: CT ANGIOGRAPHY CHEST CT ABDOMEN AND PELVIS WITH CONTRAST TECHNIQUE: Multidetector CT imaging of the chest was performed using the standard protocol during bolus administration of intravenous contrast. Multiplanar CT image reconstructions and MIPs were obtained to evaluate the vascular anatomy.  Multidetector CT imaging of the abdomen and pelvis was performed using the standard protocol during bolus administration of intravenous contrast. CONTRAST:  60m OMNIPAQUE IOHEXOL 350 MG/ML SOLN COMPARISON:  Chest CTA 03/21/2021. Chest CT 03/19/2021, PET-CT 12/19/2020 and abdominal CT 06/01/2020. FINDINGS: CTA CHEST FINDINGS Cardiovascular: The pulmonary arteries are suboptimally opacified with contrast to the level of the subsegmental branches. There is probable acute nonocclusive thrombus  within the main and upper lobe lobar branches of the right pulmonary artery. In addition, there are probable small emboli within segmental branches of the right lower lobe. No definite left-sided emboli are seen. No significant right ventricular dilatation the heart size is normal. There is no significant pericardial effusion. Atherosclerosis of the aorta, great vessels and coronary arteries noted. Mediastinum/Nodes: Again demonstrated are multiple enlarged mediastinal, hilar and left supraclavicular lymph nodes. There is a 1.5 cm AP window node on image 28/4 and a 2.4 cm subcarinal node on image 38/4. There are small left axillary lymph nodes. Overall, this thoracic adenopathy appears similar to recent previous study. The thyroid gland, trachea and esophagus demonstrate no significant findings. Lungs/Pleura: Moderate-sized dependent right pleural effusion has mildly decreased in volume compared with the recent prior study. No pneumothorax or significant left pleural effusion. There is stable atelectasis at the right lung base. Underlying emphysematous changes and diffuse subpleural reticulation are noted. There is increased ill-defined nodularity peripherally in the right upper lobe with a component measuring up to 1.4 cm on image 35/6. There is additional nodularity in the left lower lobe, measuring up to 9 mm on image 60/6. These nodules have enlarged compared with older prior studies and are suspicious for metastases.  Musculoskeletal/Chest wall: Widespread osseous metastatic disease to the ribs and thoracic spine with multiple pathologic rib fractures, similar to recent CT. No significant pathologic fracture or epidural tumor identified in the spine. CT ABDOMEN AND PELVIS FINDINGS Hepatobiliary: Widespread hepatic metastatic disease is again noted with approximately 50% replacement of the normal hepatic parenchyma. This appears similar to the recent chest CT and is new compared with the abdominal CT from 06/01/2020. The liver is mildly enlarged. Possible small calcified gallstone. No significant gallbladder wall thickening or biliary dilatation. Pancreas: Unremarkable. No pancreatic ductal dilatation or surrounding inflammatory changes. Spleen: Normal in size without focal abnormality. Adrenals/Urinary Tract: Both adrenal glands appear normal. Several low-density renal lesions are similar to prior abdominal CT and likely cysts. No definite enhancing renal mass. No evidence of urinary tract calculus or hydronephrosis. The bladder appears unchanged. Stomach/Bowel: There may be a small amount of enteric contrast within the small bowel. No evidence of bowel distension, wall thickening or focal surrounding inflammation. The appendix appears normal. Vascular/Lymphatic: There are multiple enlarged retroperitoneal and pelvic lymph nodes, including a 3.1 cm portacaval node on image 41/2, a 1.7 cm right pericaval node on image 42/2 and a 1.6 cm left inguinal node on image 88/2. Aortic and branch vessel atherosclerosis without aneurysm or large vessel occlusion. No venous thrombosis identified. Reproductive: Status post prostatectomy without recurrent mass lesion. Other: Interval development of ascites with diffuse subcutaneous edema. There are multiple peritoneal and omental soft tissue nodules consistent with widespread peritoneal metastatic disease. Representative lesions include a 5.9 x 2.0 cm right upper quadrant peritoneal nodule on  image 46/2 and a central omental implant measuring 5.8 x 2.5 cm on image 61/2. Musculoskeletal: Widespread osseous metastatic disease with pathologic fracture involving the left pubic rami. No pathologic fracture or significant epidural tumor seen in the lumbar spine. Review of the MIP images confirms the above findings. IMPRESSION: 1. Study is positive for lobar and segmental pulmonary emboli on the right. No signs of right heart strain. 2. Widespread metastatic disease to multiple lymph nodes in the chest, abdomen and pelvis, the liver, the peritoneum and multiple bones. There are pathologic fractures of multiple ribs and the left pubic rami. Ascites with multiple peritoneal metastases. 3. No evidence of hydronephrosis  or bowel obstruction. 4. Moderate size right pleural effusion has mildly decreased in size compared with recent CT. 5. Critical Value/emergent results were called by telephone at the time of interpretation on 04/08/2021 at 2:40 pm to provider Vivi Martens, who verbally acknowledged these results. Electronically Signed   By: Richardean Sale M.D.   On: 04/08/2021 14:41    EKG: Independently reviewed. NSR  Little Ishikawa DO [facility] For contact please use secure messenger on Epic  If 7PM-7AM, please contact night-coverage located on [email or web address]   04/08/2021, 3:09 PM

## 2021-04-08 NOTE — ED Provider Notes (Signed)
Travis Bowman Provider Note   CSN: [id number] Arrival date & time: 04/08/21  1914     History Chief Complaint  Patient presents with   Shortness of Breath   Cancer patient    Travis Bowman is a 60 y.o. male.  60 year old male with history of cancer who presents with increasing edema to his perineum along with concern for fluid retention.  States he has been more dyspneic on exertion.  Has not had any fever or chills.  Patient informed to increase his dose of Lasix to 80 mg a day which he has done without any improvement.  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 May 2015--  renal function recovered   Hypertension    Malignant essential hypertension    Prostate cancer ([place]) dx'd 08/2015   Right knee meniscal tear    Type 2 diabetes mellitus ([place])    Wears contact lenses     Patient Active Problem List   Diagnosis Date Noted   COVID-19 03/19/2021   Goals of care, counseling/discussion 06/04/2020   Malignant neoplasm of prostate ([place]) 12/25/2015   S/P right knee arthroscopy 02/08/2015   GERD (gastroesophageal reflux disease) 09/01/2014   Proteinuria 12/06/2013   Hematuria 12/06/2013   Elevated brain natriuretic peptide (BNP) level 12/06/2013   Hypertensive urgency, malignant 12/05/2013   DM (diabetes mellitus) ([place]) 12/05/2013   Hypertension  11/03/2011   Nephrolithiasis 11/03/2011    Past Surgical History:  Procedure Laterality Date   CATARACT EXTRACTION W/ INTRAOCULAR LENS  IMPLANT, BILATERAL  2003/  2006   EXTRACORPOREAL SHOCK WAVE LITHOTRIPSY  x5  last one 2006   KNEE ARTHROSCOPY WITH MEDIAL MENISECTOMY Right 02/08/2015   Procedure: RIGHT KNEE ARTHROSCOPY, PARTIAL MEDIAL MENISCECTOMY, CHRONDROPLASTY;  Surgeon: Sydnee Cabal, MD;  Location: [facility];  Service: Orthopedics;  Laterality: Right;   LYMPHADENECTOMY Bilateral 01/28/2016   Procedure: PELVIC LYMPHADENECTOMY;  Surgeon: Raynelle Bring, MD;  Location: [facility];  Service: Urology;  Laterality: Bilateral;   ORCHIECTOMY Right age 10   PERCUTANEOUS NEPHROSTOLITHOTOMY  x2  last one 73's   PROSTATE BIOPSY     ROBOT ASSISTED LAPAROSCOPIC RADICAL PROSTATECTOMY N/A 01/28/2016   Procedure: XI ROBOTIC ASSISTED LAPAROSCOPIC RADICAL PROSTATECTOMY LEVEL 2;  Surgeon: Raynelle Bring, MD;  Location: [facility];  Service: Urology;  Laterality: N/A;   TONSILLECTOMY AND ADENOIDECTOMY  as child   TRANSTHORACIC ECHOCARDIOGRAM  12-06-2013   mild LVH,  ef 50-55%,  mild LAE and RAE,  mild dilated aortic root   TRIGGER FINGER RELEASE Bilateral last one 2013   x 2   URETEROLITHOTOMY  64's       Family History  Problem Relation Age of Onset   Stroke Mother    CAD Mother  Alcohol abuse Father    Diabetes Brother    Marfan syndrome Brother     Social History   Tobacco Use   Smoking status: Light Smoker    Types: Cigars   Smokeless tobacco: Never   Tobacco comments:    currently occasoinal cigar--  quit cigarettes in 2007 had smoked for 25 yrs  Vaping Use   Vaping Use: Never used  Substance Use Topics   Alcohol use: Yes    Alcohol/week: 2.0 standard drinks    Types: 2 Cans of beer per week   Drug use: No    Home Medications Prior to Admission medications   Medication Sig Start Date End Date Taking? Authorizing Provider  amLODipine (NORVASC) 5 MG tablet Take  5 mg by mouth daily.    [provider]  Blood Glucose Monitoring Suppl (ACCU-CHEK GUIDE) w/Device KIT Use to test your blood sugar 11/25/18   [provider]  calcium-vitamin D (OSCAL WITH D) 500-200 MG-UNIT tablet Take 1 tablet by mouth 2 (two) times daily. 01/11/20   Wyatt Portela, MD  diclofenac (VOLTAREN) 75 MG EC tablet Take 1 tablet (75 mg total) by mouth 2 (two) times daily as needed. 03/08/21   Brunetta Genera, MD  empagliflozin (JARDIANCE) 25 MG TABS tablet Take 25 mg by mouth daily.    [provider]  furosemide (LASIX) 20 MG tablet TAKE 1 TABLET(20 MG) BY MOUTH TWICE DAILY Patient not taking: Reported on 03/19/2021 11/05/20   Wyatt Portela, MD  furosemide (LASIX) 40 MG tablet Take 1 tablet (40 mg total) by mouth daily. 04/01/21   Wyatt Portela, MD  furosemide (LASIX) 40 MG tablet Take 1 tablet (40 mg total) by mouth daily. 04/05/21   Wyatt Portela, MD  glimepiride (AMARYL) 2 MG tablet Take 2 mg by mouth daily with breakfast.    [provider]  Lancets Micro Thin 33G MISC Use to check your blood sugar 11/25/18   [provider]  leuprolide (LUPRON DEPOT, 80-MONTH,) 30 MG injection See admin instructions.    [provider]  lisinopril (PRINIVIL,ZESTRIL) 40 MG tablet Take 1 tablet by mouth every evening.  06/12/14   [provider]  LYNPARZA 150 MG tablet TAKE 2 TABLETS BY MOUTH  TWICE DAILY SWALLOW WHOLE.  MAY TAKE WITH FOOD TO  DECREASE NAUSEA AND  VOMITING 02/22/21   Wyatt Portela, MD  NEULASTA 6 MG/0.6ML injection INJECT SUBCUTANEOUSLY 6MG  48 HOURS AFTER EACH CHEMO  EVERY 21 DAYS 10/25/20   Wyatt Portela, MD  oxyCODONE (OXY IR/ROXICODONE) 5 MG immediate release tablet Take 1 tablet (5 mg total) by mouth every 4 (four) hours as needed for severe pain. 03/26/21   Wyatt Portela, MD  OYSCO 500 + D 500-200 MG-UNIT TABS TAKE 1 TABLET BY MOUTH TWICE DAILY 08/22/20   Wyatt Portela, MD  prochlorperazine (COMPAZINE) 10 MG  tablet Take 1 tablet (10 mg total) by mouth every 6 (six) hours as needed for nausea or vomiting. Patient not taking: Reported on 03/19/2021 06/04/20   Wyatt Portela, MD  tamsulosin (FLOMAX) 0.4 MG CAPS capsule Take 1 capsule (0.4 mg total) by mouth daily. 03/22/21   Patrecia Pour, MD    Allergies    Morphine and related  Review of Systems   Review of Systems  All other systems reviewed and are negative.  Physical Exam Updated Vital Signs BP 121/71 (BP Location: Left Arm)   Pulse 90   Temp [name])  97.5 F (36.4 C) (Oral)   Resp (!) 28   Ht 1.727 m ('5\' 8"' )   Wt 103.4 kg   SpO2 96%   BMI 34.67 kg/m   Physical Exam Vitals and nursing note reviewed.  Constitutional:      General: He is not in acute distress.    Appearance: Normal appearance. He is well-developed. He is not toxic-appearing.  HENT:     Head: Normocephalic and atraumatic.  Eyes:     General: Lids are normal.     Conjunctiva/sclera: Conjunctivae normal.     Pupils: Pupils are equal, round, and reactive to light.  Neck:     Thyroid: No thyroid mass.     Trachea: No tracheal deviation.  Cardiovascular:     Rate and Rhythm: Normal rate and regular rhythm.     Heart sounds: Normal heart sounds. No murmur heard.   No gallop.  Pulmonary:     Effort: Pulmonary effort is normal. No respiratory distress.     Breath sounds: Normal breath sounds. No stridor. No decreased breath sounds, wheezing, rhonchi or rales.  Abdominal:     General: There is no distension.     Palpations: Abdomen is soft.     Tenderness: There is no abdominal tenderness. There is no rebound.  Genitourinary:    Comments: Penile edema appreciated Musculoskeletal:        General: No tenderness. Normal range of motion.     Cervical back: Normal range of motion and neck supple.     Comments: 3+ bilateral lower extremity pitting edema  Skin:    General: Skin is warm and dry.     Findings: No abrasion or rash.  Neurological:     Mental Status: He is  alert and oriented to person, place, and time. Mental status is at baseline.     GCS: GCS eye subscore is 4. GCS verbal subscore is 5. GCS motor subscore is 6.     Cranial Nerves: Cranial nerves are intact. No cranial nerve deficit.     Sensory: No sensory deficit.     Motor: Motor function is intact.  Psychiatric:        Attention and Perception: Attention normal.        Speech: Speech normal.        Behavior: Behavior normal.    ED Results / Procedures / Treatments   Labs (all labs ordered are listed, but only abnormal results are displayed) Labs Reviewed  COMPREHENSIVE METABOLIC PANEL - Abnormal; Notable for the following components:      Result Value   CO2 21 (*)    Glucose, Bld 59 (*)    BUN 30 (*)    Creatinine, Ser 0.59 (*)    Albumin 2.0 (*)    AST 97 (*)    Alkaline Phosphatase 580 (*)    All other components within normal limits  CBC WITH DIFFERENTIAL/PLATELET - Abnormal; Notable for the following components:   WBC 15.1 (*)    RBC 3.31 (*)    Hemoglobin 9.6 (*)    HCT 31.7 (*)    RDW 19.5 (*)    Neutro Abs 13.3 (*)    Lymphs Abs 0.6 (*)    Abs Immature Granulocytes 0.17 (*)    All other components within normal limits  TROPONIN I (HIGH SENSITIVITY) - Abnormal; Notable for the following components:   Troponin I (High Sensitivity) 26 (*)    All other components within normal limits  RESP PANEL BY RT-PCR (FLU  A&B, COVID) ARPGX2  LIPASE, BLOOD  TROPONIN I (HIGH SENSITIVITY)    EKG EKG Interpretation  Date/Time:  Monday April 08 2021 10:01:52 EDT Ventricular Rate:  90 PR Interval:  150 QRS Duration: 89 QT Interval:  358 QTC Calculation: 438 R Axis:   -14 Text Interpretation: Sinus rhythm Inferior infarct, old Consider anterior infarct No significant change since last tracing Confirmed by Calvert Cantor [phone]) on 04/08/2021 11:00:06 AM  Radiology DG Chest 2 View  Result Date: 04/08/2021 CLINICAL DATA:  Shortness of breath. EXAM: CHEST - 2 VIEW  COMPARISON:  03/21/2021 FINDINGS: Patchy bilateral airspace disease noted right mid lung and left mid and lower lung. Stable asymmetric elevation right hemidiaphragm. Tiny right pleural effusion. Cardiopericardial silhouette is at upper limits of normal for size. Telemetry leads overlie the chest. IMPRESSION: Patchy bilateral airspace disease with tiny right pleural effusion. Multifocal pneumonia concern. Electronically Signed   By: Misty Stanley M.D.   On: 04/08/2021 11:01    Procedures Procedures   Medications Ordered in ED Medications  azithromycin (ZITHROMAX) 500 mg in sodium chloride 0.9 % 250 mL IVPB (has no administration in time range)  cefTRIAXone (ROCEPHIN) 1 g in sodium chloride 0.9 % 100 mL IVPB (has no administration in time range)  iohexol (OMNIPAQUE) 350 MG/ML injection 100 mL (has no administration in time range)    ED Course  I have reviewed the triage vital signs and the nursing notes.  Pertinent labs & imaging results that were available during my care of the patient were reviewed by me and considered in my medical decision making (see chart for details).    MDM Rules/Calculators/A&P                           Patient has evidence of PE on imaging here.  Will be started on heparin.  Will admit to the hospital service Final Clinical Impression(s) / ED Diagnoses Final diagnoses:  None    Rx / DC Orders ED Discharge Orders     None        Lacretia Leigh, MD 04/08/21 1457

## 2021-04-08 NOTE — ED Notes (Signed)
Pt BS 43 pt given OJ, provider aware.

## 2021-04-08 NOTE — ED Notes (Signed)
Assumed care of pt to room 20 at this time.

## 2021-04-08 NOTE — Progress Notes (Signed)
ANTICOAGULATION CONSULT NOTE   Pharmacy Consult for Heparin Indication: pulmonary embolus  Allergies  Allergen Reactions   Morphine And Related Nausea And Vomiting    Patient Measurements: Height: 5\' 8"  (172.7 cm) Weight: 102.7 kg (226 lb 6.6 oz) IBW/kg (Calculated) : 68.4 Heparin Dosing Weight: 90.9 kg  Vital Signs: Temp: 97.9 F (36.6 C) (10/10 2000) Temp Source: Axillary (10/10 2000) BP: 138/72 (10/10 2210) Pulse Rate: 113 (10/10 2210)  Labs: Recent Labs    04/08/21 1139 04/08/21 1311 04/08/21 2129  HGB 9.6*  --   --   HCT 31.7*  --   --   PLT 317  --   --   HEPARINUNFRC  --   --  0.19*  CREATININE 0.59*  --   --   TROPONINIHS 26* 24*  --      Estimated Creatinine Clearance: 115.5 mL/min (A) (by C-G formula based on SCr of 0.59 mg/dL (L)).   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 May 2015--  renal function recovered   Hypertension    Malignant essential hypertension    Prostate cancer ([place]) dx'd 08/2015   Right knee meniscal tear    Type 2 diabetes mellitus ([place])    Wears contact lenses     Medications:  Scheduled:   [START ON 04/09/2021] amLODipine  5 mg Oral Daily   [START ON 04/09/2021] Chlorhexidine Gluconate Cloth  6 each Topical Q0600   chlorpheniramine-HYDROcodone  5 mL Oral Once   heparin  2,000 Units Intravenous Once   insulin aspart  0-5 Units Subcutaneous QHS   [START  ON 04/09/2021] insulin aspart  0-6 Units Subcutaneous TID WC   lisinopril  20 mg Oral QPM   Infusions:   azithromycin Stopped (04/08/21 1532)   cefTRIAXone (ROCEPHIN)  IV Stopped (04/08/21 1403)   heparin 1,500 Units/hr (04/08/21 1845)    Assessment: 36 yoM with hx Prostate Ca, bone mets, to ED with 2-3 wk hx of LE, abdominal swelling with ShOB.  CTa with R-sided PE, no apparent R heart strain.   04/08/21  PM update: Heparin level = 0.19 (subtherapeutic) with heparin gtt @ 1500 units/hr No interruption in therapy and no bleeding noted per RN  Goal of Therapy:  Heparin level 0.3-0.7 units/ml Monitor platelets by anticoagulation protocol: Yes   Plan:  Heparin 2000 units IV bolus x 1 and increase infusion to 1750 units/hr Check heparin level 6 hr after rate increase Daily CBC, order daily Hep level at steady state   Belissa Kooy Trefz Pharm D 04/08/2021,10:50 PM

## 2021-04-09 ENCOUNTER — Inpatient Hospital Stay (HOSPITAL_COMMUNITY): Payer: 59

## 2021-04-09 ENCOUNTER — Ambulatory Visit (HOSPITAL_COMMUNITY)
Admission: RE | Admit: 2021-04-09 | Discharge: 2021-04-09 | Disposition: A | Discharge status: Home / Self Care | Payer: 59 | Source: Ambulatory Visit | Attending: Oncology | Admitting: Oncology

## 2021-04-09 ENCOUNTER — Inpatient Hospital Stay: Payer: 59

## 2021-04-09 DIAGNOSIS — I2699 Other pulmonary embolism without acute cor pulmonale: Secondary | ICD-10-CM

## 2021-04-09 LAB — COMPREHENSIVE METABOLIC PANEL
ALT: 23 U/L (ref 0–44)
AST: 88 U/L — ABNORMAL HIGH (ref 15–41)
Albumin: 2 g/dL — ABNORMAL LOW (ref 3.5–5.0)
Alkaline Phosphatase: 564 U/L — ABNORMAL HIGH (ref 38–126)
Anion gap: 11 (ref 5–15)
BUN: 30 mg/dL — ABNORMAL HIGH (ref 6–20)
CO2: 22 mmol/L (ref 22–32)
Calcium: 8.4 mg/dL — ABNORMAL LOW (ref 8.9–10.3)
Chloride: 100 mmol/L (ref 98–111)
Creatinine, Ser: 0.95 mg/dL (ref 0.61–1.24)
GFR, Estimated: >60 mL/min (ref >60)
Glucose, Bld: 103 mg/dL — ABNORMAL HIGH (ref 70–99)
Potassium: 4.2 mmol/L (ref 3.5–5.1)
Sodium: 133 mmol/L — ABNORMAL LOW (ref 135–145)
Total Bilirubin: 0.7 mg/dL (ref 0.3–1.2)
Total Protein: 6.9 g/dL (ref 6.5–8.1)

## 2021-04-09 LAB — GLUCOSE, CAPILLARY
Glucose-Capillary: 100 mg/dL — ABNORMAL HIGH (ref 70–99)
Glucose-Capillary: 117 mg/dL — ABNORMAL HIGH (ref 70–99)

## 2021-04-09 LAB — HEPARIN LEVEL (UNFRACTIONATED): Heparin Unfractionated: 0.28 IU/mL — ABNORMAL LOW (ref 0.30–0.70)

## 2021-04-09 LAB — CBC
HCT: 28.9 % — ABNORMAL LOW (ref 39.0–52.0)
Hemoglobin: 8.9 g/dL — ABNORMAL LOW (ref 13.0–17.0)
MCH: 29 pg (ref 26.0–34.0)
MCHC: 30.8 g/dL (ref 30.0–36.0)
MCV: 94.1 fL (ref 80.0–100.0)
Platelets: 310 10*3/uL (ref 150–400)
RBC: 3.07 MIL/uL — ABNORMAL LOW (ref 4.22–5.81)
RDW: 19.4 % — ABNORMAL HIGH (ref 11.5–15.5)
WBC: 14.5 10*3/uL — ABNORMAL HIGH (ref 4.0–10.5)
nRBC: 0 % (ref 0.0–0.2)

## 2021-04-09 MED ORDER — APIXABAN (ELIQUIS) VTE STARTER PACK (10MG AND 5MG): ORAL_TABLET | ORAL | 0 refills | Status: DC

## 2021-04-09 MED ORDER — APIXABAN 5 MG PO TABS
10.0000 mg | ORAL_TABLET | Freq: Two times a day (BID) | ORAL | Status: DC
Start: 2021-04-09 — End: 2021-04-09
  Administered 2021-04-09: 10 mg via ORAL
  Filled 2021-04-09: qty 2

## 2021-04-09 MED ORDER — ACETAMINOPHEN 325 MG PO TABS
650.0000 mg | ORAL_TABLET | Freq: Four times a day (QID) | ORAL | Status: AC | PRN
  Administered 2021-04-09: 650 mg via ORAL
  Filled 2021-04-09: qty 2

## 2021-04-09 MED ORDER — AZITHROMYCIN 250 MG PO TABS
500.0000 mg | ORAL_TABLET | Freq: Every day | ORAL | Status: DC
  Administered 2021-04-09: 500 mg via ORAL
  Filled 2021-04-09: qty 2

## 2021-04-09 MED ORDER — APIXABAN 5 MG PO TABS
5.0000 mg | ORAL_TABLET | Freq: Two times a day (BID) | ORAL | Status: DC
Start: 2021-04-16 — End: 2021-04-09

## 2021-04-09 NOTE — Discharge Summary (Signed)
Physician Discharge Summary  ANDRUE DINI OHY:[id number] DOB: 19-Sep-1960 DOA: 04/08/2021  PCP: Lujean Amel, MD  Admit date: 04/08/2021 Discharge date: 04/09/2021  Admitted From: Home Discharge disposition: Home   Code Status: Full Code   Discharge Diagnosis:   Active Problems:   Acute pulmonary embolism, unspecified pulmonary embolism type, unspecified whether acute cor pulmonale present [facility])    Chief Complaint  Patient presents with   Shortness of Breath   Cancer patient    Brief narrative: Travis Bowman is a 60 y.o. male with PMH significant for DM2, HTN, metastatic prostate cancer who follows up with Dr. Phillips Odor. Patient presented to the ED on 10/10 with complaint of progressively worsening dyspnea on exertion.  In the ED, patient was afebrile, tachycardic, tachypneic but maintaining oxygen saturation on room air. Labs showed WBC count slightly up to 12, hemoglobin 9.2, platelet 286, BN/creatinine normal, alk phos elevated to 589, albumin low at 1.8, AST elevated at 118. CT angio chest showed lobar and segmental pulm emboli on the right without right heart strain.   Patient was started on IV heparin drip and admitted to hospitalist service. See below for details.  Subjective: Patient was seen and examined this morning.  Pleasant middle-aged Caucasian male.  Lying on bed.  Not in distress.  Not requiring supplemental oxygen.  Wife at bedside. Chart reviewed.  Hemodynamically stable  Assessment/Plan: Acute pulmonary embolism in the setting of malignancy -Presented with shortness of breath.  CT angio chest showed PE as mentioned above.  -Ultrasound duplex of lower extremities showed findings consistent with acute DVT of the left common femoral vein and left femoral vein. -On admission, patient was started on heparin drip on admission.   -I discussed the case with patient's oncologist Dr. Morene Rankins this morning.  Recommended to continue anticoagulation with Eliquis at  discharge.  Insurance coverage checked.  $0 co-pay. -Per guideline, he will take Eliquis 10 mg twice daily for first week followed by 5 mg daily.  Follow-up with oncology as an outpatient. -Currently not requiring supplemental oxygen.  Metastatic prostate cancer s/p robotic assisted laparoscopic radical prostatectomy and bilateral extended pelvic lymphadenectomy 2017, status post radiation, androgen deprivation with notable bone mets as early as January 2021 with failure of Xtandi having completed Taxotere June 2022 transitioned to [place] and currently on Eligard every 4 months under consideration for salvage therapy.   -CT scan on admission continues to show widespread metastatic disease to multiple lymph nodes in the chest, abdomen and pelvis, liver, peritoneum and multiple bones, there are pathological fractures of multiple ribs and left pubic rami.  There is ascites with multiple peritoneal metastasis. -Pain controlled -follows up with Dr. Phillips Odor,  -Per patient, he is planned for next injection 04/18/21. -Continue Flomax. -On admission, patient had some urinary retention and Foley catheter was placed.  It was removed today.  Patient passed voiding trial.  Essential hypertension -Home meds include amlodipine 5 mg daily, Lasix 40 mg twice daily, lisinopril 20 mg daily -Continue the same.  Type 2 diabetes mellitus Hypoglycemia -A1c 5.4 on 03/20/2021 -Home meds include glimepiride 2 mg daily -On admission, patient had a low blood sugar level of 43.  He has poor appetite related to malignancy and A1c is low as well.  I would stop glimepiride at this time.  Patient is agreeable to the plan. -Sliding-scale insulin with Accu-Cheks. Recent Labs  Lab 04/08/21 1502 04/08/21 1714 04/08/21 2125 04/09/21 0757 04/09/21 1228  GLUCAP 43* 113* 101* 100* 117*  Allergies as of 04/09/2021       Reactions   Morphine And Related Nausea And Vomiting        Medication List     STOP taking  these medications    diclofenac 75 MG EC tablet Commonly known as: VOLTAREN   glimepiride 2 MG tablet Commonly known as: AMARYL   ibuprofen 200 MG tablet Commonly known as: ADVIL   Lynparza 150 MG tablet Generic drug: olaparib   Neulasta 6 MG/0.6ML injection Generic drug: pegfilgrastim       TAKE these medications    Accu-Chek Guide w/Device Kit Use to test your blood sugar   amLODipine 5 MG tablet Commonly known as: NORVASC Take 5 mg by mouth daily.   Apixaban Starter Pack (566m and 549m Commonly known as: ELIQUIS STARTER PACK Take as directed on package: start with two-66m66mablets twice daily for 7 days. On day 8, switch to one-66mg20mblet twice daily.   furosemide 40 MG tablet Commonly known as: Lasix Take 1 tablet (40 mg total) by mouth daily. What changed: when to take this   Lancets Micro Thin 33G Misc Use to check your blood sugar   lisinopril 40 MG tablet Commonly known as: ZESTRIL Take 20 mg by mouth every evening.   Lupron Depot (24-Month) 30 MG injection Generic drug: leuprolide Inject 30 mg into the muscle every 4 (four) months.   oxyCODONE 5 MG immediate release tablet Commonly known as: Oxy IR/ROXICODONE Take 1 tablet (5 mg total) by mouth every 4 (four) hours as needed for severe pain.   OYSCO 500 + D 500-200 MG-UNIT Tabs Generic drug: Calcium Carb-Cholecalciferol TAKE 1 TABLET BY MOUTH TWICE DAILY   tamsulosin 0.4 MG Caps capsule Commonly known as: FLOMAX Take 1 capsule (0.4 mg total) by mouth daily.        Discharge Instructions:  Diet Recommendation: Cardiac diet   '@BRDDSCINSTRUCTIONS' @  Follow ups:    Follow-up Information     Koirala, [name], MD Follow up.   Specialty: Family Medicine Contact information: [phone]         ShadWyatt Portela Follow up.   Specialty: Oncology Contact information: [phone]             Wound care:     Discharge Exam:   Vitals:   04/09/21 1000 04/09/21 1100 04/09/21 1200 04/09/21 1300  BP: 123/68 136/65 140/75 (!) 135/55  Pulse: 88 92 90 92  Resp: (!) 27 (!) 28 (!) 27 (!) 30  Temp:   98.2 F (36.8 C)   TempSrc:   Oral   SpO2: 93% 96% 96% 95%  Weight:      Height:        Body mass index is 34.43 kg/m.  General exam: Pleasant, middle-aged Caucasian male.  Not in distress Skin: No rashes, lesions or ulcers. HEENT: Atraumatic, normocephalic, no obvious bleeding Lungs: Clear to auscultation bilaterally CVS: Regular rate and rhythm, no murmur GI/Abd soft, nontender, nondistended, bowel present CNS: Alert, awake, oriented x3 Psychiatry: Mood appropriate Extremities: No pedal edema, no calf tenderness  Time coordinating discharge: 35 minutes   The results of significant diagnostics from this hospitalization (including imaging, microbiology, ancillary and laboratory) are listed below for reference.    Procedures and Diagnostic Studies:   DG Chest 2 View  Result Date: 04/08/2021 CLINICAL DATA:  Shortness of breath. EXAM: CHEST - 2  VIEW COMPARISON:  03/21/2021 FINDINGS: Patchy bilateral airspace disease noted right mid lung and left mid and lower lung. Stable asymmetric elevation right hemidiaphragm. Tiny right pleural effusion. Cardiopericardial silhouette is at upper limits of normal for size. Telemetry leads overlie the chest. IMPRESSION: Patchy bilateral airspace disease with tiny right pleural effusion. Multifocal pneumonia concern. Electronically Signed   By: Misty Stanley M.D.   On: 04/08/2021 11:01   CT Angio Chest PE W/Cm &/Or Wo Cm  Result Date: 04/08/2021 CLINICAL DATA:  Shortness of breath with abdominal and scrotal swelling for 2-3 weeks. History of prostate cancer. Clinical concern for pulmonary embolism. EXAM: CT ANGIOGRAPHY CHEST CT ABDOMEN AND PELVIS WITH CONTRAST TECHNIQUE: Multidetector CT imaging  of the chest was performed using the standard protocol during bolus administration of intravenous contrast. Multiplanar CT image reconstructions and MIPs were obtained to evaluate the vascular anatomy. Multidetector CT imaging of the abdomen and pelvis was performed using the standard protocol during bolus administration of intravenous contrast. CONTRAST:  56m OMNIPAQUE IOHEXOL 350 MG/ML SOLN COMPARISON:  Chest CTA 03/21/2021. Chest CT 03/19/2021, PET-CT 12/19/2020 and abdominal CT 06/01/2020. FINDINGS: CTA CHEST FINDINGS Cardiovascular: The pulmonary arteries are suboptimally opacified with contrast to the level of the subsegmental branches. There is probable acute nonocclusive thrombus within the main and upper lobe lobar branches of the right pulmonary artery. In addition, there are probable small emboli within segmental branches of the right lower lobe. No definite left-sided emboli are seen. No significant right ventricular dilatation the heart size is normal. There is no significant pericardial effusion. Atherosclerosis of the aorta, great vessels and coronary arteries noted. Mediastinum/Nodes: Again demonstrated are multiple enlarged mediastinal, hilar and left supraclavicular lymph nodes. There is a 1.5 cm AP window node on image 28/4 and a 2.4 cm subcarinal node on image 38/4. There are small left axillary lymph nodes. Overall, this thoracic adenopathy appears similar to recent previous study. The thyroid gland, trachea and esophagus demonstrate no significant findings. Lungs/Pleura: Moderate-sized dependent right pleural effusion has mildly decreased in volume compared with the recent prior study. No pneumothorax or significant left pleural effusion. There is stable atelectasis at the right lung base. Underlying emphysematous changes and diffuse subpleural reticulation are noted. There is increased ill-defined nodularity peripherally in the right upper lobe with a component measuring up to 1.4 cm on image  35/6. There is additional nodularity in the left lower lobe, measuring up to 9 mm on image 60/6. These nodules have enlarged compared with older prior studies and are suspicious for metastases. Musculoskeletal/Chest wall: Widespread osseous metastatic disease to the ribs and thoracic spine with multiple pathologic rib fractures, similar to recent CT. No significant pathologic fracture or epidural tumor identified in the spine. CT ABDOMEN AND PELVIS FINDINGS Hepatobiliary: Widespread hepatic metastatic disease is again noted with approximately 50% replacement of the normal hepatic parenchyma. This appears similar to the recent chest CT and is new compared with the abdominal CT from 06/01/2020. The liver is mildly enlarged. Possible small calcified gallstone. No significant gallbladder wall thickening or biliary dilatation. Pancreas: Unremarkable. No pancreatic ductal dilatation or surrounding inflammatory changes. Spleen: Normal in size without focal abnormality. Adrenals/Urinary Tract: Both adrenal glands appear normal. Several low-density renal lesions are similar to prior abdominal CT and likely cysts. No definite enhancing renal mass. No evidence of urinary tract calculus or hydronephrosis. The bladder appears unchanged. Stomach/Bowel: There may be a small amount of enteric contrast within the small bowel. No evidence of bowel distension, wall thickening or focal  surrounding inflammation. The appendix appears normal. Vascular/Lymphatic: There are multiple enlarged retroperitoneal and pelvic lymph nodes, including a 3.1 cm portacaval node on image 41/2, a 1.7 cm right pericaval node on image 42/2 and a 1.6 cm left inguinal node on image 88/2. Aortic and branch vessel atherosclerosis without aneurysm or large vessel occlusion. No venous thrombosis identified. Reproductive: Status post prostatectomy without recurrent mass lesion. Other: Interval development of ascites with diffuse subcutaneous edema. There are  multiple peritoneal and omental soft tissue nodules consistent with widespread peritoneal metastatic disease. Representative lesions include a 5.9 x 2.0 cm right upper quadrant peritoneal nodule on image 46/2 and a central omental implant measuring 5.8 x 2.5 cm on image 61/2. Musculoskeletal: Widespread osseous metastatic disease with pathologic fracture involving the left pubic rami. No pathologic fracture or significant epidural tumor seen in the lumbar spine. Review of the MIP images confirms the above findings. IMPRESSION: 1. Study is positive for lobar and segmental pulmonary emboli on the right. No signs of right heart strain. 2. Widespread metastatic disease to multiple lymph nodes in the chest, abdomen and pelvis, the liver, the peritoneum and multiple bones. There are pathologic fractures of multiple ribs and the left pubic rami. Ascites with multiple peritoneal metastases. 3. No evidence of hydronephrosis or bowel obstruction. 4. Moderate size right pleural effusion has mildly decreased in size compared with recent CT. 5. Critical Value/emergent results were called by telephone at the time of interpretation on 04/08/2021 at 2:40 pm to provider Vivi Martens, who verbally acknowledged these results. Electronically Signed   By: Richardean Sale M.D.   On: 04/08/2021 14:41   CT Abdomen Pelvis W Contrast  Result Date: 04/08/2021 CLINICAL DATA:  Shortness of breath with abdominal and scrotal swelling for 2-3 weeks. History of prostate cancer. Clinical concern for pulmonary embolism. EXAM: CT ANGIOGRAPHY CHEST CT ABDOMEN AND PELVIS WITH CONTRAST TECHNIQUE: Multidetector CT imaging of the chest was performed using the standard protocol during bolus administration of intravenous contrast. Multiplanar CT image reconstructions and MIPs were obtained to evaluate the vascular anatomy. Multidetector CT imaging of the abdomen and pelvis was performed using the standard protocol during bolus administration of  intravenous contrast. CONTRAST:  48m OMNIPAQUE IOHEXOL 350 MG/ML SOLN COMPARISON:  Chest CTA 03/21/2021. Chest CT 03/19/2021, PET-CT 12/19/2020 and abdominal CT 06/01/2020. FINDINGS: CTA CHEST FINDINGS Cardiovascular: The pulmonary arteries are suboptimally opacified with contrast to the level of the subsegmental branches. There is probable acute nonocclusive thrombus within the main and upper lobe lobar branches of the right pulmonary artery. In addition, there are probable small emboli within segmental branches of the right lower lobe. No definite left-sided emboli are seen. No significant right ventricular dilatation the heart size is normal. There is no significant pericardial effusion. Atherosclerosis of the aorta, great vessels and coronary arteries noted. Mediastinum/Nodes: Again demonstrated are multiple enlarged mediastinal, hilar and left supraclavicular lymph nodes. There is a 1.5 cm AP window node on image 28/4 and a 2.4 cm subcarinal node on image 38/4. There are small left axillary lymph nodes. Overall, this thoracic adenopathy appears similar to recent previous study. The thyroid gland, trachea and esophagus demonstrate no significant findings. Lungs/Pleura: Moderate-sized dependent right pleural effusion has mildly decreased in volume compared with the recent prior study. No pneumothorax or significant left pleural effusion. There is stable atelectasis at the right lung base. Underlying emphysematous changes and diffuse subpleural reticulation are noted. There is increased ill-defined nodularity peripherally in the right upper lobe with a component measuring up  to 1.4 cm on image 35/6. There is additional nodularity in the left lower lobe, measuring up to 9 mm on image 60/6. These nodules have enlarged compared with older prior studies and are suspicious for metastases. Musculoskeletal/Chest wall: Widespread osseous metastatic disease to the ribs and thoracic spine with multiple pathologic rib  fractures, similar to recent CT. No significant pathologic fracture or epidural tumor identified in the spine. CT ABDOMEN AND PELVIS FINDINGS Hepatobiliary: Widespread hepatic metastatic disease is again noted with approximately 50% replacement of the normal hepatic parenchyma. This appears similar to the recent chest CT and is new compared with the abdominal CT from 06/01/2020. The liver is mildly enlarged. Possible small calcified gallstone. No significant gallbladder wall thickening or biliary dilatation. Pancreas: Unremarkable. No pancreatic ductal dilatation or surrounding inflammatory changes. Spleen: Normal in size without focal abnormality. Adrenals/Urinary Tract: Both adrenal glands appear normal. Several low-density renal lesions are similar to prior abdominal CT and likely cysts. No definite enhancing renal mass. No evidence of urinary tract calculus or hydronephrosis. The bladder appears unchanged. Stomach/Bowel: There may be a small amount of enteric contrast within the small bowel. No evidence of bowel distension, wall thickening or focal surrounding inflammation. The appendix appears normal. Vascular/Lymphatic: There are multiple enlarged retroperitoneal and pelvic lymph nodes, including a 3.1 cm portacaval node on image 41/2, a 1.7 cm right pericaval node on image 42/2 and a 1.6 cm left inguinal node on image 88/2. Aortic and branch vessel atherosclerosis without aneurysm or large vessel occlusion. No venous thrombosis identified. Reproductive: Status post prostatectomy without recurrent mass lesion. Other: Interval development of ascites with diffuse subcutaneous edema. There are multiple peritoneal and omental soft tissue nodules consistent with widespread peritoneal metastatic disease. Representative lesions include a 5.9 x 2.0 cm right upper quadrant peritoneal nodule on image 46/2 and a central omental implant measuring 5.8 x 2.5 cm on image 61/2. Musculoskeletal: Widespread osseous metastatic  disease with pathologic fracture involving the left pubic rami. No pathologic fracture or significant epidural tumor seen in the lumbar spine. Review of the MIP images confirms the above findings. IMPRESSION: 1. Study is positive for lobar and segmental pulmonary emboli on the right. No signs of right heart strain. 2. Widespread metastatic disease to multiple lymph nodes in the chest, abdomen and pelvis, the liver, the peritoneum and multiple bones. There are pathologic fractures of multiple ribs and the left pubic rami. Ascites with multiple peritoneal metastases. 3. No evidence of hydronephrosis or bowel obstruction. 4. Moderate size right pleural effusion has mildly decreased in size compared with recent CT. 5. Critical Value/emergent results were called by telephone at the time of interpretation on 04/08/2021 at 2:40 pm to provider Vivi Martens, who verbally acknowledged these results. Electronically Signed   By: Richardean Sale M.D.   On: 04/08/2021 14:41     Labs:   Basic Metabolic Panel: Recent Labs  Lab 04/08/21 1139 04/09/21 0500  NA 141 133*  K 4.2 4.2  CL 108 100  CO2 21* 22  GLUCOSE 59* 103*  BUN 30* 30*  CREATININE 0.59* 0.95  CALCIUM 9.0 8.4*   GFR Estimated Creatinine Clearance: 97.2 mL/min (by C-G formula based on SCr of 0.95 mg/dL). Liver Function Tests: Recent Labs  Lab 04/08/21 1139 04/09/21 0500  AST 97* 88*  ALT 25 23  ALKPHOS 580* 564*  BILITOT 0.9 0.7  PROT 7.3 6.9  ALBUMIN 2.0* 2.0*   Recent Labs  Lab 04/08/21 1139  LIPASE 29   No results for input(s):  AMMONIA in the last 168 hours. Coagulation profile No results for input(s): INR, PROTIME in the last 168 hours.  CBC: Recent Labs  Lab 04/08/21 1139 04/09/21 0500  WBC 15.1* 14.5*  NEUTROABS 13.3*  --   HGB 9.6* 8.9*  HCT 31.7* 28.9*  MCV 95.8 94.1  PLT 317 310   Cardiac Enzymes: No results for input(s): CKTOTAL, CKMB, CKMBINDEX, TROPONINI in the last 168 hours. BNP: Invalid input(s):  POCBNP CBG: Recent Labs  Lab 04/08/21 1502 04/08/21 1714 04/08/21 2125 04/09/21 0757 04/09/21 1228  GLUCAP 43* 113* 101* 100* 117*   D-Dimer No results for input(s): DDIMER in the last 72 hours. Hgb A1c No results for input(s): HGBA1C in the last 72 hours. Lipid Profile No results for input(s): CHOL, HDL, LDLCALC, TRIG, CHOLHDL, LDLDIRECT in the last 72 hours. Thyroid function studies No results for input(s): TSH, T4TOTAL, T3FREE, THYROIDAB in the last 72 hours.  Invalid input(s): FREET3 Anemia work up No results for input(s): VITAMINB12, FOLATE, FERRITIN, TIBC, IRON, RETICCTPCT in the last 72 hours. Microbiology Recent Results (from the past 240 hour(s))  Resp Panel by RT-PCR (Flu A&B, Covid) Nasopharyngeal Swab     Status: None   Collection Time: 04/08/21  1:01 PM   Specimen: Nasopharyngeal Swab; Nasopharyngeal(NP) swabs in vial transport medium  Result Value Ref Range Status   SARS Coronavirus 2 by RT PCR NEGATIVE NEGATIVE Final    Comment: (NOTE) SARS-CoV-2 target nucleic acids are NOT DETECTED.  The SARS-CoV-2 RNA is generally detectable in upper respiratory specimens during the acute phase of infection. The lowest concentration of SARS-CoV-2 viral copies this assay can detect is 138 copies/mL. A negative result does not preclude SARS-Cov-2 infection and should not be used as the sole basis for treatment or other patient management decisions. A negative result may occur with  improper specimen collection/handling, submission of specimen other than nasopharyngeal swab, presence of viral mutation(s) within the areas targeted by this assay, and inadequate number of viral copies(<138 copies/mL). A negative result must be combined with clinical observations, patient history, and epidemiological information. The expected result is Negative.  Fact Sheet for Patients:  EntrepreneurPulse.com.au  Fact Sheet for Healthcare Providers:   IncredibleEmployment.be  This test is no t yet approved or cleared by the [place] FDA and  has been authorized for detection and/or diagnosis of SARS-CoV-2 by FDA under an Emergency Use Authorization (EUA). This EUA will remain  in effect (meaning this test can be used) for the duration of the COVID-19 declaration under Section 564(b)(1) of the Act, 21 U.S.C.section 360bbb-3(b)(1), unless the authorization is terminated  or revoked sooner.       Influenza A by PCR NEGATIVE NEGATIVE Final   Influenza B by PCR NEGATIVE NEGATIVE Final    Comment: (NOTE) The Xpert Xpress SARS-CoV-2/FLU/RSV plus assay is intended as an aid in the diagnosis of influenza from Nasopharyngeal swab specimens and should not be used as a sole basis for treatment. Nasal washings and aspirates are unacceptable for Xpert Xpress SARS-CoV-2/FLU/RSV testing.  Fact Sheet for Patients: EntrepreneurPulse.com.au  Fact Sheet for Healthcare Providers: IncredibleEmployment.be  This test is not yet approved or cleared by the [place] FDA and has been authorized for detection and/or diagnosis of SARS-CoV-2 by FDA under an Emergency Use Authorization (EUA). This EUA will remain in effect (meaning this test can be used) for the duration of the COVID-19 declaration under Section 564(b)(1) of the Act, 21 U.S.C. section 360bbb-3(b)(1), unless the authorization is terminated or revoked.  Performed  at [facility], [place] [address], [place], [place] [address]   MRSA Next Gen by PCR, Nasal     Status: None   Collection Time: 04/08/21  6:15 PM   Specimen: Nasal Mucosa; Nasal Swab  Result Value Ref Range Status   MRSA by PCR Next Gen NOT DETECTED NOT DETECTED Final    Comment: (NOTE) The GeneXpert MRSA Assay (FDA approved for NASAL specimens only), is one component of a comprehensive MRSA colonization surveillance program. It is not intended  to diagnose MRSA infection nor to guide or monitor treatment for MRSA infections. Test performance is not FDA approved in patients less than 59 years old. Performed at [facility], [place] [address] [place], [place] [address]      Signed: Marlowe Aschoff Milanya Sunderland  [facility] 04/09/2021, 4:00 PM

## 2021-04-09 NOTE — TOC Benefit Eligibility Note (Signed)
Transition of Care [facility]) Benefit Eligibility Note    Patient Details  Name: Travis Bowman MRN: [id number] Date of Birth: [birth year]   Medication/Dose: Eliquis 75m 2x day and Xarelto 20qday  Covered?: Yes  Tier: Other  Prescription Coverage Preferred Pharmacy: local  Spoke with Person/Company/Phone Number:: Travis Bowman [phone] Co-Pay: Eliquis is a 0 copay, Xarelto 20qday is covered but requires prior auth [id number] Prior Approval:  (Xarelto requires prior approval)  Deductible: Met       FKerin SalenPhone Number: 04/09/2021, 9:40 AM

## 2021-04-09 NOTE — Discharge Instructions (Addendum)
Information on my medicine - ELIQUIS (apixaban)  This medication education was reviewed with me or my healthcare representative as part of my discharge preparation.    Why was Eliquis prescribed for you? Eliquis was prescribed to treat blood clots that may have been found in the veins of your legs (deep vein thrombosis) or in your lungs (pulmonary embolism) and to reduce the risk of them occurring again.  What do You need to know about Eliquis? The starting dose is 10 mg (two 5 mg tablets) taken TWICE daily for the FIRST SEVEN (7) DAYS, then on Tuesday 04/16/2021  the dose is reduced to ONE 5 mg tablet taken TWICE daily. Eliquis may be taken with or without food.   Try to take the dose about the same time in the morning and in the evening. If you have difficulty swallowing the tablet whole please discuss with your pharmacist how to take the medication safely.  Take Eliquis exactly as prescribed and DO NOT stop taking Eliquis without talking to the doctor who prescribed the medication. Stopping may increase your risk of developing a new blood clot.  Refill your prescription before you run out.  After discharge, you should have regular check-up appointments with your healthcare provider that is prescribing your Eliquis.    What do you do if you miss a dose? If a dose of ELIQUIS is not taken at the scheduled time, take it as soon as possible on the same day and twice-daily administration should be resumed. The dose should not be doubled to make up for a missed dose.  Important Safety Information A possible side effect of Eliquis is bleeding. You should call your healthcare provider right away if you experience any of the following: Bleeding from an injury or your nose that does not stop. Unusual colored urine (red or dark brown) or unusual colored stools (red or black). Unusual bruising for unknown reasons. A serious fall or if you hit your head (even if there is no bleeding).  Some  medicines may interact with Eliquis and might increase your risk of bleeding or clotting while on Eliquis. To help avoid this, consult your healthcare provider or pharmacist prior to using any new prescription or non-prescription medications, including herbals, vitamins, non-steroidal anti-inflammatory drugs (NSAIDs) and supplements.  This website has more information on Eliquis (apixaban): http://www.eliquis.com/eliquis/home

## 2021-04-09 NOTE — Progress Notes (Signed)
Bilateral lower extremity venous duplex has been completed. Preliminary results can be found in CV Proc through chart review.  Results were given to the patient's nurse, Deborah.  04/09/21 10:28 AM Carlos Levering RVT

## 2021-04-09 NOTE — Progress Notes (Signed)
Foley Catheter removed at 1350- patient informed to notify RN when he has urinated so he can be discharged.

## 2021-04-09 NOTE — TOC Initial Note (Signed)
Transition of Care [facility]) - Initial/Assessment Note    Patient Details  Name: [facility] Bowman MRN: [id number] Date of Birth: Sep 21, 1960  Transition [facility] Care [facility]) CM/SW Contact:    Leeroy Cha, RN Phone Number: 04/09/2021, 7:41 AM  Clinical Narrative:                 60 y.o. male with medical history significant of hypertension, type 2 diabetes non-insulin-dependent and metastatic prostate cancer who follows Dr. Alen Blew status post robotic assisted laparoscopic radical prostatectomy and bilateral extended pelvic lymphadenectomy 2017 status post radiation, androgen deprivation with notable bone mets as early as January 2021 with failure of Xtandi having completed Taxotere June 2022 transition to [place] and currently on Eligard every 4 months under consideration for salvage therapy who presents with worsening dyspnea on exertion, shortness of breath, worsening fluid retention.  Patient evaluated in the ED with CT chest abdomen pelvis remarkable for acute lobar and subsegmental pulmonary emboli on the right with no signs of heart strain, widespread metastatic disease to the lymph nodes of the chest abdomen pelvis as well as multiple pathological fractures of ribs pubic rami with notable ascites and a moderate pleural effusion.  Given patient's acute respiratory failure, acute PE(provoked in the setting of metastatic disease) patient will be admitted to the hospital for IV anticoagulation and further consideration of treatment with the assistance of oncology Dr. Alen Blew.   Of note patient was recently evaluated and treated for acute multifocal pneumonia secondary to COVID-19 on September 20. TOC PLAN OF CARE: Following for toc needs and progression of care and condition.  Expected Discharge Plan: Home/Self Care Barriers to Discharge: Continued Medical Work up   Patient Goals and CMS Choice Patient states their goals for this hospitalization and ongoing recovery are:: to go home CMS  Medicare.gov Compare Post Acute Care list provided to:: Patient Choice offered to / list presented to : Patient  Expected Discharge Plan and Services Expected Discharge Plan: Home/Self Care   Discharge Planning Services: CM Consult   Living arrangements for the past 2 months: Single Family Home                                      Prior Living Arrangements/Services Living arrangements for the past 2 months: Single Family Home Lives with:: Spouse Patient language and need for interpreter reviewed:: Yes Do you feel safe going back to the place where you live?: Yes      Need for Family Participation in Patient Care: Yes (Comment) Care giver support system in place?: Yes (comment)   Criminal Activity/Legal Involvement Pertinent to Current Situation/Hospitalization: No - Comment as needed  Activities of Daily Living Home Assistive Devices/Equipment: CBG Meter, Contact lenses ADL Screening (condition at time of admission) Patient's cognitive ability adequate to safely complete daily activities?: Yes Is the patient deaf or have difficulty hearing?: No Does the patient have difficulty seeing, even when wearing glasses/contacts?: No Does the patient have difficulty concentrating, remembering, or making decisions?: No Patient able to express need for assistance with ADLs?: Yes Does the patient have difficulty dressing or bathing?: No Independently performs ADLs?: Yes (appropriate for developmental age) (Having weakness, shortness of breath) Does the patient have difficulty walking or climbing stairs?: Yes (secondary to weakness and shortness of breath) Weakness of Legs: Both Weakness of Arms/Hands: None  Permission Sought/Granted  Emotional Assessment Appearance:: Appears stated age     Orientation: : Oriented to Self, Oriented to Place, Oriented to  Time, Oriented to Situation Alcohol / Substance Use: Not Applicable Psych Involvement: No  (comment)  Admission diagnosis:  Other pulmonary embolism without acute cor pulmonale, unspecified chronicity ([place]) [I26.99] Acute pulmonary embolism, unspecified pulmonary embolism type, unspecified whether acute cor pulmonale present [facility]) [I26.99] Patient Active Problem List   Diagnosis Date Noted   Acute pulmonary embolism, unspecified pulmonary embolism type, unspecified whether acute cor pulmonale present ([place]) 04/08/2021   COVID-19 03/19/2021   Goals of care, counseling/discussion 06/04/2020   Malignant neoplasm of prostate ([place]) 12/25/2015   S/P right knee arthroscopy 02/08/2015   GERD (gastroesophageal reflux disease) 09/01/2014   Proteinuria 12/06/2013   Hematuria 12/06/2013   Elevated brain natriuretic peptide (BNP) level 12/06/2013   Hypertensive urgency, malignant 12/05/2013   DM (diabetes mellitus) ([place]) 12/05/2013   Hypertension 11/03/2011   Nephrolithiasis 11/03/2011   PCP:  Lujean Amel, MD Pharmacy:   [facility]ORE [place], [place] - [address] AT [facility] OF [address] [place] [address] Phone: [phone] Fax: [phone]     Social Determinants of Health (SDOH) Interventions    Readmission Risk Interventions No flowsheet data found.

## 2021-04-09 NOTE — Progress Notes (Signed)
Patient discharged home with cell phone, clothes and shoes. AVS documentation reviewed with patient and wife. IV removed and patient taken downstairs in wheelchair.

## 2021-04-09 NOTE — TOC Transition Note (Signed)
Transition of Care [facility]) - CM/SW Discharge Note   Patient Details  Name: Travis Bowman MRN: [id number] Date of Birth: [birth year]  Transition of Care [facility]) CM/SW Contact:  Leeroy Cha, RN Phone Number: 04/09/2021, 11:21 AM   Clinical Narrative:    Orders checked for toc needs none found patient dcd to home with self care.     Barriers to Discharge: Continued Medical Work up   Patient Goals and CMS Choice Patient states their goals for this hospitalization and ongoing recovery are:: to go home CMS Medicare.gov Compare Post Acute Care list provided to:: Patient Choice offered to / list presented to : Patient  Discharge Placement                       Discharge Plan and Services   Discharge Planning Services: CM Consult                                 Social Determinants of Health (SDOH) Interventions     Readmission Risk Interventions No flowsheet data found.

## 2021-04-09 NOTE — Progress Notes (Signed)
ANTICOAGULATION CONSULT NOTE   Pharmacy Consult for Heparin Indication: pulmonary embolus  Allergies  Allergen Reactions   Morphine And Related Nausea And Vomiting    Patient Measurements: Height: 5\' 8"  (172.7 cm) Weight: 102.7 kg (226 lb 6.6 oz) IBW/kg (Calculated) : 68.4 Heparin Dosing Weight: 90.9 kg  Vital Signs: Temp: 97.8 F (36.6 C) (10/11 0400) Temp Source: Oral (10/11 0400) BP: 108/54 (10/11 0500) Pulse Rate: 85 (10/11 0500)  Labs: Recent Labs    04/08/21 1139 04/08/21 1311 04/08/21 2129 04/09/21 0500  HGB 9.6*  --   --  8.9*  HCT 31.7*  --   --  28.9*  PLT 317  --   --  310  HEPARINUNFRC  --   --  0.19* 0.28*  CREATININE 0.59*  --   --   --   TROPONINIHS 26* 24*  --   --      Estimated Creatinine Clearance: 115.5 mL/min (A) (by C-G formula based on SCr of 0.59 mg/dL (L)).   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 May 2015--  renal function recovered   Hypertension    Malignant essential hypertension    Prostate cancer ([place]) dx'd 08/2015   Right knee meniscal tear    Type 2 diabetes mellitus ([place])    Wears contact lenses     Medications:  Scheduled:   amLODipine  5 mg Oral Daily   Chlorhexidine Gluconate Cloth  6 each Topical Q0600   chlorpheniramine-HYDROcodone  5 mL Oral Once   insulin aspart  0-5 Units Subcutaneous QHS   insulin aspart  0-6 Units Subcutaneous TID WC    lisinopril  20 mg Oral QPM   melatonin  3 mg Oral QHS   Infusions:   azithromycin Stopped (04/08/21 1532)   cefTRIAXone (ROCEPHIN)  IV Stopped (04/08/21 1403)   heparin 1,750 Units/hr (04/09/21 0516)    Assessment: 55 yoM with hx Prostate Ca, bone mets, to ED with 2-3 wk hx of LE, abdominal swelling with ShOB.  CTa with R-sided PE, no apparent R heart strain.   04/09/21   Heparin level = 0.28 (increasing but still subtherapeutic) with heparin gtt @ 1750 units/hr Hgb 8.9, Pltc WNL No complications of therapy noted  Goal of Therapy:  Heparin level 0.3-0.7 units/ml Monitor platelets by anticoagulation protocol: Yes   Plan:  Increase heparin infusion to 1950 units/hr Check heparin level 6 hr after rate increase Daily CBC, order daily Hep level at steady state   Bennette Hasty, Toribio Harbour Pharm D 04/09/2021,6:04 AM

## 2021-04-09 NOTE — Progress Notes (Signed)
ANTICOAGULATION CONSULT NOTE   Pharmacy Consult for Heparin>>Eliquis Indication: pulmonary embolus  Allergies  Allergen Reactions   Morphine And Related Nausea And Vomiting    Patient Measurements: Height: 5\' 8"  (172.7 cm) Weight: 102.7 kg (226 lb 6.6 oz) IBW/kg (Calculated) : 68.4 Heparin Dosing Weight: 90.9 kg  Vital Signs: Temp: 98.5 F (36.9 C) (10/11 0800) Temp Source: Oral (10/11 0800) BP: 117/53 (10/11 0800) Pulse Rate: 82 (10/11 0800)  Labs: Recent Labs    04/08/21 1139 04/08/21 1311 04/08/21 2129 04/09/21 0500  HGB 9.6*  --   --  8.9*  HCT 31.7*  --   --  28.9*  PLT 317  --   --  310  HEPARINUNFRC  --   --  0.19* 0.28*  CREATININE 0.59*  --   --  0.95  TROPONINIHS 26* 24*  --   --      Estimated Creatinine Clearance: 97.2 mL/min (by C-G formula based on SCr of 0.95 mg/dL).     Assessment: 43 yoM with hx Prostate Ca, bone mets, to ED with 2-3 wk hx of LE, abdominal swelling with ShOB.  CTa with R-sided PE, no apparent R heart strain.   04/09/21   Heparin level = 0.28 (increasing but still subtherapeutic) with heparin gtt @ 1750 units/hr Hgb 8.9, Pltc WNL No complications of therapy noted To transition to Eliquis  Goal of Therapy:  Heparin level 0.3-0.7 units/ml Monitor platelets by anticoagulation protocol: Yes   Plan:  DC heparin drip Eliquis 10 mg po bid x 7 days followed by Eliquis 5 mg po bid Will provide 30 day free card and education today  Eudelia Bunch, Pharm.D 04/09/2021 9:57 AM

## 2021-04-11 ENCOUNTER — Encounter: Payer: Self-pay | Admitting: Oncology

## 2021-04-15 ENCOUNTER — Other Ambulatory Visit: Payer: Self-pay | Admitting: Radiology

## 2021-04-15 ENCOUNTER — Ambulatory Visit (HOSPITAL_COMMUNITY)
Admission: RE | Admit: 2021-04-15 | Discharge: 2021-04-15 | Disposition: A | Discharge status: Home / Self Care | Payer: 59 | Source: Ambulatory Visit | Attending: Oncology | Admitting: Oncology

## 2021-04-15 DIAGNOSIS — J90 Pleural effusion, not elsewhere classified: Secondary | ICD-10-CM | POA: Diagnosis present

## 2021-04-15 DIAGNOSIS — C61 Malignant neoplasm of prostate: Secondary | ICD-10-CM | POA: Insufficient documentation

## 2021-04-16 ENCOUNTER — Ambulatory Visit (HOSPITAL_COMMUNITY)
Admission: RE | Admit: 2021-04-16 | Discharge: 2021-04-16 | Disposition: A | Discharge status: Home / Self Care | Payer: 59 | Source: Ambulatory Visit | Attending: Oncology | Admitting: Oncology

## 2021-04-16 ENCOUNTER — Encounter (HOSPITAL_COMMUNITY): Payer: Self-pay

## 2021-04-16 ENCOUNTER — Other Ambulatory Visit: Payer: Self-pay | Admitting: *Deleted

## 2021-04-16 ENCOUNTER — Ambulatory Visit (HOSPITAL_COMMUNITY)
Admission: RE | Admit: 2021-04-16 | Discharge: 2021-04-16 | Disposition: A | Discharge status: Home / Self Care | Payer: 59 | Source: Ambulatory Visit | Attending: Interventional Radiology | Admitting: Interventional Radiology

## 2021-04-16 ENCOUNTER — Other Ambulatory Visit: Payer: Self-pay | Admitting: Oncology

## 2021-04-16 ENCOUNTER — Other Ambulatory Visit: Payer: Self-pay

## 2021-04-16 DIAGNOSIS — Z79899 Other long term (current) drug therapy: Secondary | ICD-10-CM | POA: Insufficient documentation

## 2021-04-16 DIAGNOSIS — J90 Pleural effusion, not elsewhere classified: Secondary | ICD-10-CM

## 2021-04-16 DIAGNOSIS — C61 Malignant neoplasm of prostate: Secondary | ICD-10-CM

## 2021-04-16 DIAGNOSIS — K219 Gastro-esophageal reflux disease without esophagitis: Secondary | ICD-10-CM | POA: Insufficient documentation

## 2021-04-16 DIAGNOSIS — I509 Heart failure, unspecified: Secondary | ICD-10-CM | POA: Insufficient documentation

## 2021-04-16 DIAGNOSIS — Z7901 Long term (current) use of anticoagulants: Secondary | ICD-10-CM | POA: Insufficient documentation

## 2021-04-16 DIAGNOSIS — R188 Other ascites: Secondary | ICD-10-CM | POA: Diagnosis not present

## 2021-04-16 DIAGNOSIS — Z8616 Personal history of COVID-19: Secondary | ICD-10-CM | POA: Insufficient documentation

## 2021-04-16 DIAGNOSIS — Z86711 Personal history of pulmonary embolism: Secondary | ICD-10-CM | POA: Diagnosis not present

## 2021-04-16 DIAGNOSIS — J910 Malignant pleural effusion: Secondary | ICD-10-CM | POA: Insufficient documentation

## 2021-04-16 DIAGNOSIS — E119 Type 2 diabetes mellitus without complications: Secondary | ICD-10-CM | POA: Diagnosis not present

## 2021-04-16 DIAGNOSIS — I110 Hypertensive heart disease with heart failure: Secondary | ICD-10-CM | POA: Insufficient documentation

## 2021-04-16 DIAGNOSIS — Z885 Allergy status to narcotic agent status: Secondary | ICD-10-CM | POA: Insufficient documentation

## 2021-04-16 DIAGNOSIS — Z938 Other artificial opening status: Secondary | ICD-10-CM

## 2021-04-16 HISTORY — DX: Dyspnea, unspecified: R06.00

## 2021-04-16 HISTORY — PX: IR PERC PLEURAL DRAIN W/INDWELL CATH W/IMG GUIDE: IMG5383

## 2021-04-16 LAB — CBC
HCT: 28.7 % — ABNORMAL LOW (ref 39.0–52.0)
Hemoglobin: 8.8 g/dL — ABNORMAL LOW (ref 13.0–17.0)
MCH: 29.5 pg (ref 26.0–34.0)
MCHC: 30.7 g/dL (ref 30.0–36.0)
MCV: 96.3 fL (ref 80.0–100.0)
Platelets: 300 10*3/uL (ref 150–400)
RBC: 2.98 MIL/uL — ABNORMAL LOW (ref 4.22–5.81)
RDW: 19.6 % — ABNORMAL HIGH (ref 11.5–15.5)
WBC: 13.8 10*3/uL — ABNORMAL HIGH (ref 4.0–10.5)
nRBC: 0 % (ref 0.0–0.2)

## 2021-04-16 LAB — GLUCOSE, CAPILLARY: Glucose-Capillary: 111 mg/dL — ABNORMAL HIGH (ref 70–99)

## 2021-04-16 LAB — PROTIME-INR
INR: 2.7 — ABNORMAL HIGH (ref 0.8–1.2)
Prothrombin Time: 28.3 seconds — ABNORMAL HIGH (ref 11.4–15.2)

## 2021-04-16 MED ORDER — MIDAZOLAM HCL 2 MG/2ML IJ SOLN
INTRAMUSCULAR | Status: DC | PRN
  Administered 2021-04-16 (×4): .5 mg via INTRAVENOUS

## 2021-04-16 MED ORDER — CEFAZOLIN SODIUM-DEXTROSE 2-4 GM/100ML-% IV SOLN
2.0000 g | Freq: Once | INTRAVENOUS | Status: AC
Start: 2021-04-16 — End: 2021-04-16

## 2021-04-16 MED ORDER — POLYETHYLENE GLYCOL 3350 17 G PO PACK: 17.0000 g | PACK | Freq: Every day | ORAL | 0 refills | Status: AC

## 2021-04-16 MED ORDER — SODIUM CHLORIDE 0.9 % IV SOLN: INTRAVENOUS | Status: DC

## 2021-04-16 MED ORDER — LIDOCAINE HCL 1 % IJ SOLN
INTRAMUSCULAR | Status: AC
  Filled 2021-04-16: qty 20

## 2021-04-16 MED ORDER — FENTANYL CITRATE (PF) 100 MCG/2ML IJ SOLN
INTRAMUSCULAR | Status: AC
  Filled 2021-04-16: qty 2

## 2021-04-16 MED ORDER — LIDOCAINE HCL (PF) 1 % IJ SOLN
INTRAMUSCULAR | Status: DC | PRN
  Administered 2021-04-16 (×2): 10 mL

## 2021-04-16 MED ORDER — CEFAZOLIN SODIUM-DEXTROSE 2-4 GM/100ML-% IV SOLN
INTRAVENOUS | Status: AC
  Administered 2021-04-16: 2 g via INTRAVENOUS
  Filled 2021-04-16: qty 100

## 2021-04-16 MED ORDER — SENNOSIDES-DOCUSATE SODIUM 8.6-50 MG PO TABS: 1.0000 | ORAL_TABLET | Freq: Two times a day (BID) | ORAL | 0 refills | Status: AC

## 2021-04-16 MED ORDER — MIDAZOLAM HCL 2 MG/2ML IJ SOLN
INTRAMUSCULAR | Status: AC
  Filled 2021-04-16: qty 2

## 2021-04-16 MED ORDER — FENTANYL CITRATE (PF) 100 MCG/2ML IJ SOLN
INTRAMUSCULAR | Status: DC | PRN
  Administered 2021-04-16 (×4): 25 ug via INTRAVENOUS

## 2021-04-16 NOTE — H&P (Signed)
Referring Physician(s): Wyatt Portela  Supervising Physician: Michaelle Birks  Patient Status:  [facility] Bowman  Chief Complaint:  Dyspnea, recurrent malignant right pleural effusion  Subjective: Patient known to IR service from left renal biopsy in 2015, and right thoracentesis on 03/21/2021.  He has a history of advanced metastatic prostate cancer with prior laparoscopic prostatectomy and pelvic lymphadenectomy in 2017, recent PE on Eliquis, COVID 22 in September of this year, hypertension, diabetes, GERD, CHF, hepatitis C, nephrolithiasis, prior MPGN, recurrent malignant right pleural effusion as well as ascites.  Latest imaging reveals persistent moderate right pleural effusion and he is scheduled today for right Pleurx catheter placement.  He currently denies fever, headache, chest pain, nausea, vomiting or bleeding.  He does have dyspnea, occasional dry cough, abdominal and back discomfort and fatigue.  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 May 2015--  renal function recovered   Hypertension    Malignant essential hypertension    Prostate cancer ([place]) dx'd 08/2015   Right knee meniscal tear    Type 2 diabetes mellitus ([place])    Wears contact lenses    Past Surgical History:  Procedure Laterality Date   CATARACT EXTRACTION W/ INTRAOCULAR LENS  IMPLANT, BILATERAL  2003/  2006   EXTRACORPOREAL SHOCK WAVE LITHOTRIPSY  x5   last one 2006   KNEE ARTHROSCOPY WITH MEDIAL MENISECTOMY Right 02/08/2015   Procedure: RIGHT KNEE ARTHROSCOPY, PARTIAL MEDIAL MENISCECTOMY, CHRONDROPLASTY;  Surgeon: Sydnee Cabal, MD;  Location: [facility];  Service: Orthopedics;  Laterality: Right;   LYMPHADENECTOMY Bilateral 01/28/2016   Procedure: PELVIC LYMPHADENECTOMY;  Surgeon: Raynelle Bring, MD;  Location: [facility];  Service: Urology;  Laterality: Bilateral;   ORCHIECTOMY Right age 25   PERCUTANEOUS NEPHROSTOLITHOTOMY  x2  last one 2's   PROSTATE BIOPSY     ROBOT ASSISTED LAPAROSCOPIC RADICAL PROSTATECTOMY N/A 01/28/2016   Procedure: XI ROBOTIC ASSISTED LAPAROSCOPIC RADICAL PROSTATECTOMY LEVEL 2;  Surgeon: Raynelle Bring, MD;  Location: [facility];  Service: Urology;  Laterality: N/A;   TONSILLECTOMY AND ADENOIDECTOMY  as child   TRANSTHORACIC ECHOCARDIOGRAM  12-06-2013   mild LVH,  ef 50-55%,  mild LAE and RAE,  mild dilated aortic root   TRIGGER FINGER RELEASE Bilateral last one 2013   x 2   URETEROLITHOTOMY  1990's      Allergies: Morphine and related  Medications: Prior to Admission medications   Medication Sig Start Date End Date Taking? Authorizing Provider  amLODipine (NORVASC) 5 MG tablet Take 5 mg by mouth daily.   Yes [provider]  APIXABAN (ELIQUIS) VTE STARTER PACK (10MG AND 5MG) Take as directed on package: start with two-20m tablets twice daily for 7 days. On day 8, switch to one-528mtablet twice daily. 04/09/21  Yes Dahal, BiMarlowe AschoffMD  furosemide (LASIX)  40 MG tablet Take 1 tablet (40 mg total) by mouth daily. Patient taking differently: Take 40 mg by mouth 2 (two) times daily. 04/05/21  Yes Wyatt Portela, MD  lisinopril (PRINIVIL,ZESTRIL) 40 MG tablet Take 20 mg by mouth every evening. 06/12/14  Yes [provider]  oxyCODONE (OXY IR/ROXICODONE) 5 MG immediate release tablet Take 1 tablet (5 mg total) by mouth every 4 (four) hours as needed for severe pain. 03/26/21  Yes Shadad,  Mathis Dad, MD  OYSCO 500 + D 500-200 MG-UNIT TABS TAKE 1 TABLET BY MOUTH TWICE DAILY Patient taking differently: Take 1 tablet by mouth 2 (two) times daily. 08/22/20  Yes Wyatt Portela, MD  tamsulosin (FLOMAX) 0.4 MG CAPS capsule Take 1 capsule (0.4 mg total) by mouth daily. 03/22/21  Yes Patrecia Pour, MD  Blood Glucose Monitoring Suppl (ACCU-CHEK GUIDE) w/Device KIT Use to test your blood sugar 11/25/18   [provider]  Lancets Micro Thin 33G MISC Use to check your blood sugar 11/25/18   [provider]  leuprolide (LUPRON DEPOT, 11-MONTH,) 30 MG injection Inject 30 mg into the muscle every 4 (four) months.    [provider]     Vital Signs: BP 109/62   Pulse (!) 105   Temp 99.2 F (37.3 C) (Oral)   Resp (!) 24   SpO2 94%   Physical Exam patient awake, appears weak.  Chest with diminished breath sounds bases.  Heart with slightly tachycardic but regular rhythm, positive murmur.  Abdomen distended, few bowel sounds, currently nontender.  Bilateral lower extremity edema noted  Imaging: [place] CHEST (PLEURAL EFFUSION)  Result Date: 04/15/2021 CLINICAL DATA:  Question resolving pleural effusion EXAM: CHEST ULTRASOUND COMPARISON:  CT 04/08/2021 FINDINGS: Ultrasound of the right chest demonstrates moderate right pleural effusion, likely not significantly changed since prior CT. No visible left effusion. IMPRESSION: Moderate right pleural effusion, likely not significantly changed since prior CT. Electronically Signed   By: Rolm Baptise M.D.   On: 04/15/2021 22:35    Labs:  CBC: Recent Labs    04/01/21 0924 04/08/21 1139 04/09/21 0500 04/16/21 0739  WBC 12.0* 15.1* 14.5* 13.8*  HGB 9.2* 9.6* 8.9* 8.8*  HCT 29.6* 31.7* 28.9* 28.7*  PLT 286 317 310 300    COAGS: Recent Labs    03/19/21 1600 03/20/21 0309 04/16/21 0739  INR 1.1 1.3* 2.7*    BMP: Recent Labs    03/22/21 0332 04/01/21 0924 04/08/21 1139 04/09/21 0500  NA 133* 138 141 133*  K 3.6  4.1 4.2 4.2  CL 100 106 108 100  CO2 21* 20* 21* 22  GLUCOSE 101* 108* 59* 103*  BUN 23* 23* 30* 30*  CALCIUM 8.6* 8.8* 9.0 8.4*  CREATININE 0.70 0.75 0.59* 0.95  GFRNONAA >60 >60 >60 >60    LIVER FUNCTION TESTS: Recent Labs    03/19/21 1600 04/01/21 0924 04/08/21 1139 04/09/21 0500  BILITOT 1.1 0.7 0.9 0.7  AST 90* 118* 97* 88*  ALT 30 37 25 23  ALKPHOS 412* 589* 580* 564*  PROT 7.0 6.5 7.3 6.9  ALBUMIN 2.2* 1.8* 2.0* 2.0*    Assessment and Plan: Patient known to IR service from left renal biopsy in 2015, and right thoracentesis on 03/21/2021.  He has a history of advanced metastatic prostate cancer with prior laparoscopic prostatectomy and pelvic lymphadenectomy in 2017, recent PE on Eliquis, COVID 45 in September of this year, hypertension, diabetes, GERD, CHF, hepatitis C, nephrolithiasis, prior MPGN, recurrent malignant right pleural  effusion as well as ascites.  Latest imaging reveals persistent moderate right pleural effusion and he is scheduled today for right Pleurx catheter placement.  Details/risks of procedure, including but not limited to, internal bleeding, infection, injury to adjacent structures/pneumothorax discussed with patient with his understanding and consent.   Electronically Signed: D. Rowe Dallin, PA-C 04/16/2021, 9:00 AM   I spent a total of 25 Minutes at the the patient's bedside AND on the patient's hospital floor or unit, greater than 50% of which was counseling/coordinating care for right Pleurx catheter placement

## 2021-04-16 NOTE — Progress Notes (Signed)
See assessment for GI. Sent message to Dr Alen Blew regarding symptoms of constipation. Dr Alen Blew has sent new RXs to patient's pharmacy.

## 2021-04-16 NOTE — Discharge Instructions (Signed)
Interventional radiology phone numbers [phone] After hours [phone]  Indwelling Pleural Catheter Home Guide   An indwelling pleural catheter is a thin, flexible tube that is inserted under your skin and into your chest. The catheter drains excess fluid that collects in the area between the chest wall and the lungs (pleural space).After the catheter is inserted, it can be attached to a bottle that collects fluid. The pleural catheter will allow you to drain fluid from your chest at home on a regular basis (sometimes daily). This will eliminate the need for frequent visits to the hospital or clinic to drain the fluid. The catheter may be removed after the excess fluid problem is resolved, usually after 2-3 months. It is important to follow instructions from your health care provider about how to drain and care for your catheter. What are the risks? Generally, this is a safe procedure. However, problems may occur, including: Infection. Skin damage around the catheter. Lung damage. Failure of the chest tube to work properly. Spreading of cancer cells along the catheter, if you have cancer. Supplies needed: Vacuum-sealed drainage bottle with attached drainage line. Sterile dressing. Sterile alcohol pads. Sterile gloves. Valve cap. Sterile gauze pads, 4  4 inch (10 cm  10 cm). Tape. Adhesive dressing. Sterile foam catheter pad. How to care for your catheter and insertion site Wash your hands with soap and warm water before and after touching the catheter or insertion site. If soap and water are not available, use hand sanitizer. Check your bandage (dressing) daily to make sure it is clean and dry. Keep the skin around the catheter clean and dry. Check the catheter regularly for any cracks or kinks in the tubing. Check your catheter insertion site every day for signs of infection. Check for: Skin breakdown. Redness, swelling, or pain. Fluid or blood. Warmth. Pus or a bad  smell. How to drain your catheter You may need to drain your catheter every day, or more or less often as told by your health care provider. Follow instructions from your health care provider about how to drain your catheter. You may also refer to instructions that come with the drainage system. To drain the catheter: Wash your hands with soap and warm water. If soap and water are not available, use hand sanitizer. Carefully remove the dressing from around the catheter. Wash your hands again. Put on the gloves provided. Prepare the vacuum-sealed drainage bottle and drainage line. Close the drainage line of the vacuum-sealed drainage bottle by squeezing the pinch clamp or rolling the wheel of the roller clamp toward the bottle. The vacuum in the bottle will be lost if the line is not closed completely. Remove the access tip cover from the drainage line. Do not touch the end. Set it on a sterile surface. Remove the catheter valve cap and throw it away. Use an alcohol pad to clean the end of the catheter. Insert the access tip into the catheter valve. Make sure the valve and access tip are securely connected. Listen for a click to confirm that they are connected. Insert the T plunger to break the vacuum seal on the drainage bottle. Open the clamp on the drainage line. Allow the catheter to drain. Keep the catheter and the drainage bottle below the level of your chest. There may be a one-way valve on the end of the tubing that will allow liquid and air to flow out of the catheter without letting air inside. Drain the amount of fluid as told by your  health care provider. It usually takes 5-15 minutes. Do not drain more than 1000 mL of fluid. You may feel a little discomfort while you are draining. If the pain is severe, stop draining and contact your health care provider. After you finish draining the catheter, remove the drainage bottle tubing from the catheter. Use a clean alcohol pad to wipe the  catheter tip. Place a clean cap on the end of the catheter. Use an alcohol pad to clean the skin around the catheter. Allow the skin to air-dry. Put the catheter pad on your skin. Curl the catheter into loops and place it on the pad. Do not place the catheter on your skin. Replace the dressing over the catheter. Discard the drainage bottle as instructed by your health care provider. Do not reuse the drainage bottle. How to change your dressing Change your dressing at least once a week, or more often if needed to keep the dressing dry. Be sure to change the dressing whenever it becomes moist. Your health care provider will tell you how often to change your dressing. Wash your hands with soap and warm water. If soap and water are not available, use hand sanitizer. Gently remove the old dressing. Avoid using scissors to remove the dressing. Sharp objects may damage the catheter. Wash the skin around the insertion site with mild, fragrance-free soap and warm water. Rinse well, then pat the area dry with a clean cloth. Check the skin around the catheter for signs of infection. Check for: Skin breakdown. Redness, swelling, or pain. Fluid or blood. Warmth. Pus or a bad smell. If your catheter was stitched (sutured) to your skin, look at the suture to make sure it is still anchored in your skin. Do not apply creams, ointments, or alcohol to the area. Let your skin air-dry completely before you apply a new dressing. Curl the catheter into loops and place it on the sterile catheter pad. Do not place the catheter on your skin. If you do not have a pad, use a clean dressing. Slide the dressing under the disk that holds the drainage catheter in place. Use gauze to cover the catheter and the catheter pad. The catheter should rest on the pad or dressing, not on your skin. Tape the dressing to your skin. You may be instructed to use an adhesive dressing covering instead of gauze and tape. Wash your hands  with soap and warm water. If soap and water are not available, use hand sanitizer. General recommendations Always wash your hands with soap and warm water before and after caring for your catheter and drainage bottle. Use a mild, fragrance-free soap. If soap and water are not available, use hand sanitizer. Always make sure there are no leaks in the catheter or drainage bottle. Each time you drain the catheter, note the color and amount of fluid. Do not touch the tip of the catheter or the drainage bottle tubing. Do not reuse drainage bottles. Do not take baths, swim, or use a hot tub until your health care provider approves. Ask your health care provider if you may take showers. You may only be allowed to take sponge baths. Take deep breaths regularly, followed by a cough. Doing this can help to prevent lung infection. Contact a health care provider if: You have any questions about caring for your catheter or drainage bottle. You still have pain at the catheter insertion site more than 2 days after your procedure. You have pain while draining your catheter. Your catheter  becomes bent, twisted, or cracked. The connection between the catheter and the collection bottle becomes loose. You have any of these around your catheter insertion site or coming from it: Skin breakdown. Redness, swelling, or pain. Fluid or blood. Warmth. Pus or a bad smell. Get help right away if: You have a fever or chills. You have chest pain. You have dizziness or shortness of breath. You have severe redness, swelling, or pain at your catheter insertion site. The catheter comes out. The catheter is blocked or clogged. Summary An indwelling pleural catheter is a thin, flexible tube that is inserted under your skin and into your chest. The catheter drains excess fluid that collects in the area between the chest wall and the lungs (pleural space). It is important to follow instructions from your health care provider  about how to drain and care for your catheter. Do not touch the tip of the catheter or the drainage bottle tubing. Always wash your hands with soap and water before and after caring for your catheter and drainage bottle. If soap and water are not available, use hand sanitizer. This information is not intended to replace advice given to you by your health care provider. Make sure you discuss any questions you have with your health care provider. Document Revised: 04/16/2020 Document Reviewed: 02/16/2020 Elsevier Patient Education  2021 [place].     Moderate Conscious Sedation, Adult, Care After This sheet gives you information about how to care for yourself after your procedure. Your health care provider may also give you more specific instructions. If you have problems or questions, contact your health care provider. What can I expect after the procedure? After the procedure, it is common to have: Sleepiness for several hours. Impaired judgment for several hours. Difficulty with balance. Vomiting if you eat too soon. Follow these instructions at home: For the time period you were told by your health care provider: Rest. Do not participate in activities where you could fall or become injured. Do not drive or use machinery. Do not drink alcohol. Do not take sleeping pills or medicines that cause drowsiness. Do not make important decisions or sign legal documents. Do not take care of children on your own.      Eating and drinking Follow the diet recommended by your health care provider. Drink enough fluid to keep your urine pale yellow. If you vomit: Drink water, juice, or soup when you can drink without vomiting. Make sure you have little or no nausea before eating solid foods.   General instructions Take over-the-counter and prescription medicines only as told by your health care provider. Have a responsible adult stay with you for the time you are told. It is important to  have someone help care for you until you are awake and alert. Do not smoke. Keep all follow-up visits as told by your health care provider. This is important. Contact a health care provider if: You are still sleepy or having trouble with balance after 24 hours. You feel light-headed. You keep feeling nauseous or you keep vomiting. You develop a rash. You have a fever. You have redness or swelling around the IV site. Get help right away if: You have trouble breathing. You have new-onset confusion at home. Summary After the procedure, it is common to feel sleepy, have impaired judgment, or feel nauseous if you eat too soon. Rest after you get home. Know the things you should not do after the procedure. Follow the diet recommended by your health care  provider and drink enough fluid to keep your urine pale yellow. Get help right away if you have trouble breathing or new-onset confusion at home. This information is not intended to replace advice given to you by your health care provider. Make sure you discuss any questions you have with your health care provider. Document Revised: 10/14/2019 Document Reviewed: 05/12/2019 Elsevier Patient Education  2021 [employer].

## 2021-04-16 NOTE — Procedures (Signed)
Vascular and Interventional Radiology Procedure Note  Patient: Travis Bowman DOB: 12-19-1960 Medical Record Number: [id number] Note Date/Time: 04/16/21 11:19 AM   Performing Physician: Michaelle Birks, MD Assistant(s): None  Diagnosis:  malignant R pleural effusion  Procedure:  TUNNELED RIGHT PLEURAL DRAINAGE CATHETER PLACEMENT (PleurX) THERAPEUTIC THORACENTESIS  Anesthesia: Conscious Sedation Complications: None Estimated Blood Loss:  0 mL Specimens:  Sent None  Findings:  The Right chest was accessed with a Yueh Needle, and a tunneled (PleurX) pleural drainage catheter was placed. Rx thoracentesis was performed.  See detailed procedure note with images in PACS. The patient tolerated the procedure well without incident or complication and was returned to Recovery in stable condition.    Michaelle Birks, MD [facility]   Pager. [place]

## 2021-04-17 ENCOUNTER — Other Ambulatory Visit: Payer: Self-pay | Admitting: Oncology

## 2021-04-17 DIAGNOSIS — C61 Malignant neoplasm of prostate: Secondary | ICD-10-CM

## 2021-04-17 MED ORDER — OXYCODONE HCL 5 MG PO TABS: 5.0000 mg | ORAL_TABLET | ORAL | 0 refills | Status: AC | PRN

## 2021-04-18 ENCOUNTER — Ambulatory Visit (HOSPITAL_COMMUNITY)
Admission: RE | Admit: 2021-04-18 | Discharge: 2021-04-18 | Disposition: A | Discharge status: Home / Self Care | Payer: 59 | Source: Ambulatory Visit | Attending: Oncology | Admitting: Oncology

## 2021-04-18 ENCOUNTER — Other Ambulatory Visit: Payer: Self-pay

## 2021-04-18 DIAGNOSIS — C61 Malignant neoplasm of prostate: Secondary | ICD-10-CM | POA: Insufficient documentation

## 2021-04-18 LAB — BASIC METABOLIC PANEL
Anion gap: 10 (ref 5–15)
BUN: 76 mg/dL — ABNORMAL HIGH (ref 6–20)
CO2: 20 mmol/L — ABNORMAL LOW (ref 22–32)
Calcium: 8.5 mg/dL — ABNORMAL LOW (ref 8.9–10.3)
Chloride: 100 mmol/L (ref 98–111)
Creatinine, Ser: 1.1 mg/dL (ref 0.61–1.24)
GFR, Estimated: >60 mL/min (ref >60)
Glucose, Bld: 106 mg/dL — ABNORMAL HIGH (ref 70–99)
Potassium: 5.3 mmol/L — ABNORMAL HIGH (ref 3.5–5.1)
Sodium: 130 mmol/L — ABNORMAL LOW (ref 135–145)

## 2021-04-18 LAB — CBC WITH DIFFERENTIAL/PLATELET
Abs Immature Granulocytes: 0.18 10*3/uL — ABNORMAL HIGH (ref 0.00–0.07)
Basophils Absolute: 0 10*3/uL (ref 0.0–0.1)
Basophils Relative: 0 %
Eosinophils Absolute: 0.1 10*3/uL (ref 0.0–0.5)
Eosinophils Relative: 1 %
HCT: 27.3 % — ABNORMAL LOW (ref 39.0–52.0)
Hemoglobin: 8.6 g/dL — ABNORMAL LOW (ref 13.0–17.0)
Immature Granulocytes: 2 %
Lymphocytes Relative: 7 %
Lymphs Abs: 0.7 10*3/uL (ref 0.7–4.0)
MCH: 30.1 pg (ref 26.0–34.0)
MCHC: 31.5 g/dL (ref 30.0–36.0)
MCV: 95.5 fL (ref 80.0–100.0)
Monocytes Absolute: 0.7 10*3/uL (ref 0.1–1.0)
Monocytes Relative: 6 %
Neutro Abs: 9.3 10*3/uL — ABNORMAL HIGH (ref 1.7–7.7)
Neutrophils Relative %: 84 %
Platelets: 309 10*3/uL (ref 150–400)
RBC: 2.86 MIL/uL — ABNORMAL LOW (ref 4.22–5.81)
RDW: 19.7 % — ABNORMAL HIGH (ref 11.5–15.5)
WBC: 11 10*3/uL — ABNORMAL HIGH (ref 4.0–10.5)
nRBC: 0 % (ref 0.0–0.2)

## 2021-04-18 MED ORDER — LUTETIUM LU 177 VIPIVOTIDE TET 1000 MBQ/ML IV SOLN
200.0000 | Freq: Once | INTRAVENOUS | Status: AC
  Administered 2021-04-18: 200.95 via INTRAVENOUS

## 2021-04-18 MED ORDER — SODIUM CHLORIDE 0.9 % IV SOLN: INTRAVENOUS | Status: DC

## 2021-04-21 ENCOUNTER — Emergency Department (HOSPITAL_COMMUNITY): Payer: 59

## 2021-04-21 ENCOUNTER — Observation Stay (HOSPITAL_COMMUNITY): Payer: 59

## 2021-04-21 ENCOUNTER — Other Ambulatory Visit: Payer: Self-pay

## 2021-04-21 ENCOUNTER — Inpatient Hospital Stay (HOSPITAL_COMMUNITY)
Admission: EM | Admit: 2021-04-21 | Discharge: 2021-04-24 | DRG: 682 | Disposition: A | Discharge status: Hospice | Payer: 59 | Attending: Family Medicine | Admitting: Family Medicine

## 2021-04-21 ENCOUNTER — Encounter (HOSPITAL_COMMUNITY): Payer: Self-pay

## 2021-04-21 DIAGNOSIS — Z6833 Body mass index (BMI) 33.0-33.9, adult: Secondary | ICD-10-CM

## 2021-04-21 DIAGNOSIS — C61 Malignant neoplasm of prostate: Secondary | ICD-10-CM | POA: Diagnosis present

## 2021-04-21 DIAGNOSIS — Z79891 Long term (current) use of opiate analgesic: Secondary | ICD-10-CM

## 2021-04-21 DIAGNOSIS — D630 Anemia in neoplastic disease: Secondary | ICD-10-CM | POA: Diagnosis present

## 2021-04-21 DIAGNOSIS — R0600 Dyspnea, unspecified: Secondary | ICD-10-CM

## 2021-04-21 DIAGNOSIS — Z66 Do not resuscitate: Secondary | ICD-10-CM | POA: Diagnosis present

## 2021-04-21 DIAGNOSIS — Z20822 Contact with and (suspected) exposure to covid-19: Secondary | ICD-10-CM | POA: Diagnosis present

## 2021-04-21 DIAGNOSIS — Z86718 Personal history of other venous thrombosis and embolism: Secondary | ICD-10-CM

## 2021-04-21 DIAGNOSIS — D649 Anemia, unspecified: Secondary | ICD-10-CM | POA: Diagnosis present

## 2021-04-21 DIAGNOSIS — R52 Pain, unspecified: Secondary | ICD-10-CM

## 2021-04-21 DIAGNOSIS — K219 Gastro-esophageal reflux disease without esophagitis: Secondary | ICD-10-CM | POA: Diagnosis present

## 2021-04-21 DIAGNOSIS — E861 Hypovolemia: Secondary | ICD-10-CM

## 2021-04-21 DIAGNOSIS — Z515 Encounter for palliative care: Secondary | ICD-10-CM

## 2021-04-21 DIAGNOSIS — I110 Hypertensive heart disease with heart failure: Secondary | ICD-10-CM | POA: Diagnosis present

## 2021-04-21 DIAGNOSIS — L8990 Pressure ulcer of unspecified site, unspecified stage: Secondary | ICD-10-CM | POA: Insufficient documentation

## 2021-04-21 DIAGNOSIS — E860 Dehydration: Secondary | ICD-10-CM

## 2021-04-21 DIAGNOSIS — F17290 Nicotine dependence, other tobacco product, uncomplicated: Secondary | ICD-10-CM | POA: Diagnosis present

## 2021-04-21 DIAGNOSIS — Z885 Allergy status to narcotic agent status: Secondary | ICD-10-CM

## 2021-04-21 DIAGNOSIS — N179 Acute kidney failure, unspecified: Principal | ICD-10-CM | POA: Diagnosis present

## 2021-04-21 DIAGNOSIS — E43 Unspecified severe protein-calorie malnutrition: Secondary | ICD-10-CM | POA: Diagnosis present

## 2021-04-21 DIAGNOSIS — Z833 Family history of diabetes mellitus: Secondary | ICD-10-CM

## 2021-04-21 DIAGNOSIS — Z79899 Other long term (current) drug therapy: Secondary | ICD-10-CM

## 2021-04-21 DIAGNOSIS — R188 Other ascites: Secondary | ICD-10-CM

## 2021-04-21 DIAGNOSIS — L89151 Pressure ulcer of sacral region, stage 1: Secondary | ICD-10-CM | POA: Diagnosis present

## 2021-04-21 DIAGNOSIS — E669 Obesity, unspecified: Secondary | ICD-10-CM | POA: Diagnosis present

## 2021-04-21 DIAGNOSIS — I959 Hypotension, unspecified: Secondary | ICD-10-CM | POA: Diagnosis present

## 2021-04-21 DIAGNOSIS — I9589 Other hypotension: Secondary | ICD-10-CM

## 2021-04-21 DIAGNOSIS — C7800 Secondary malignant neoplasm of unspecified lung: Secondary | ICD-10-CM | POA: Diagnosis present

## 2021-04-21 DIAGNOSIS — R180 Malignant ascites: Secondary | ICD-10-CM

## 2021-04-21 DIAGNOSIS — E871 Hypo-osmolality and hyponatremia: Secondary | ICD-10-CM | POA: Diagnosis present

## 2021-04-21 DIAGNOSIS — J90 Pleural effusion, not elsewhere classified: Secondary | ICD-10-CM

## 2021-04-21 DIAGNOSIS — Z7901 Long term (current) use of anticoagulants: Secondary | ICD-10-CM

## 2021-04-21 DIAGNOSIS — I509 Heart failure, unspecified: Secondary | ICD-10-CM | POA: Diagnosis present

## 2021-04-21 DIAGNOSIS — E875 Hyperkalemia: Secondary | ICD-10-CM | POA: Diagnosis present

## 2021-04-21 DIAGNOSIS — E119 Type 2 diabetes mellitus without complications: Secondary | ICD-10-CM | POA: Diagnosis present

## 2021-04-21 DIAGNOSIS — Z86711 Personal history of pulmonary embolism: Secondary | ICD-10-CM

## 2021-04-21 DIAGNOSIS — R0602 Shortness of breath: Secondary | ICD-10-CM

## 2021-04-21 DIAGNOSIS — Z8249 Family history of ischemic heart disease and other diseases of the circulatory system: Secondary | ICD-10-CM

## 2021-04-21 DIAGNOSIS — C7951 Secondary malignant neoplasm of bone: Secondary | ICD-10-CM | POA: Diagnosis present

## 2021-04-21 DIAGNOSIS — R5381 Other malaise: Secondary | ICD-10-CM | POA: Diagnosis present

## 2021-04-21 DIAGNOSIS — E872 Acidosis, unspecified: Secondary | ICD-10-CM | POA: Diagnosis present

## 2021-04-21 LAB — COMPREHENSIVE METABOLIC PANEL
ALT: 15 U/L (ref 0–44)
AST: 104 U/L — ABNORMAL HIGH (ref 15–41)
Albumin: 1.8 g/dL — ABNORMAL LOW (ref 3.5–5.0)
Alkaline Phosphatase: 697 U/L — ABNORMAL HIGH (ref 38–126)
Anion gap: 11 (ref 5–15)
BUN: 83 mg/dL — ABNORMAL HIGH (ref 6–20)
CO2: 19 mmol/L — ABNORMAL LOW (ref 22–32)
Calcium: 8.4 mg/dL — ABNORMAL LOW (ref 8.9–10.3)
Chloride: 96 mmol/L — ABNORMAL LOW (ref 98–111)
Creatinine, Ser: 1.34 mg/dL — ABNORMAL HIGH (ref 0.61–1.24)
GFR, Estimated: >60 mL/min (ref >60)
Glucose, Bld: 113 mg/dL — ABNORMAL HIGH (ref 70–99)
Potassium: 5.7 mmol/L — ABNORMAL HIGH (ref 3.5–5.1)
Sodium: 126 mmol/L — ABNORMAL LOW (ref 135–145)
Total Bilirubin: 0.8 mg/dL (ref 0.3–1.2)
Total Protein: 6.4 g/dL — ABNORMAL LOW (ref 6.5–8.1)

## 2021-04-21 LAB — CBC WITH DIFFERENTIAL/PLATELET
Abs Immature Granulocytes: 0.22 10*3/uL — ABNORMAL HIGH (ref 0.00–0.07)
Basophils Absolute: 0 10*3/uL (ref 0.0–0.1)
Basophils Relative: 0 %
Eosinophils Absolute: 0 10*3/uL (ref 0.0–0.5)
Eosinophils Relative: 0 %
HCT: 28.7 % — ABNORMAL LOW (ref 39.0–52.0)
Hemoglobin: 8.7 g/dL — ABNORMAL LOW (ref 13.0–17.0)
Immature Granulocytes: 2 %
Lymphocytes Relative: 6 %
Lymphs Abs: 0.7 10*3/uL (ref 0.7–4.0)
MCH: 29.1 pg (ref 26.0–34.0)
MCHC: 30.3 g/dL (ref 30.0–36.0)
MCV: 96 fL (ref 80.0–100.0)
Monocytes Absolute: 0.9 10*3/uL (ref 0.1–1.0)
Monocytes Relative: 7 %
Neutro Abs: 10.3 10*3/uL — ABNORMAL HIGH (ref 1.7–7.7)
Neutrophils Relative %: 85 %
Platelets: 262 10*3/uL (ref 150–400)
RBC: 2.99 MIL/uL — ABNORMAL LOW (ref 4.22–5.81)
RDW: 19.9 % — ABNORMAL HIGH (ref 11.5–15.5)
WBC: 12.1 10*3/uL — ABNORMAL HIGH (ref 4.0–10.5)
nRBC: 0 % (ref 0.0–0.2)

## 2021-04-21 LAB — RESP PANEL BY RT-PCR (FLU A&B, COVID) ARPGX2
Influenza A by PCR: NEGATIVE
Influenza B by PCR: NEGATIVE
SARS Coronavirus 2 by RT PCR: NEGATIVE

## 2021-04-21 LAB — I-STAT CHEM 8, ED
BUN: 91 mg/dL — ABNORMAL HIGH (ref 6–20)
Calcium, Ion: 1.14 mmol/L — ABNORMAL LOW (ref 1.15–1.40)
Chloride: 100 mmol/L (ref 98–111)
Creatinine, Ser: 1.6 mg/dL — ABNORMAL HIGH (ref 0.61–1.24)
Glucose, Bld: 109 mg/dL — ABNORMAL HIGH (ref 70–99)
HCT: 26 % — ABNORMAL LOW (ref 39.0–52.0)
Hemoglobin: 8.8 g/dL — ABNORMAL LOW (ref 13.0–17.0)
Potassium: 5.7 mmol/L — ABNORMAL HIGH (ref 3.5–5.1)
Sodium: 126 mmol/L — ABNORMAL LOW (ref 135–145)
TCO2: 19 mmol/L — ABNORMAL LOW (ref 22–32)

## 2021-04-21 LAB — PROCALCITONIN: Procalcitonin: 2.45 ng/mL

## 2021-04-21 LAB — LIPASE, BLOOD: Lipase: 23 U/L (ref 11–51)

## 2021-04-21 LAB — GLUCOSE, CAPILLARY: Glucose-Capillary: 96 mg/dL (ref 70–99)

## 2021-04-21 LAB — LACTIC ACID, PLASMA: Lactic Acid, Venous: 3.1 mmol/L (ref 0.5–1.9)

## 2021-04-21 LAB — BRAIN NATRIURETIC PEPTIDE: B Natriuretic Peptide: 41.3 pg/mL (ref 0.0–100.0)

## 2021-04-21 MED ORDER — LACTATED RINGERS IV BOLUS
1000.0000 mL | Freq: Once | INTRAVENOUS | Status: AC
  Administered 2021-04-21: 1000 mL via INTRAVENOUS

## 2021-04-21 MED ORDER — OXYCODONE HCL 5 MG PO TABS
5.0000 mg | ORAL_TABLET | ORAL | Status: DC | PRN
  Administered 2021-04-21 – 2021-04-24 (×5): 5 mg via ORAL
  Filled 2021-04-21 (×6): qty 1

## 2021-04-21 MED ORDER — ONDANSETRON HCL 4 MG PO TABS: 4.0000 mg | ORAL_TABLET | Freq: Four times a day (QID) | ORAL | Status: DC | PRN

## 2021-04-21 MED ORDER — SODIUM ZIRCONIUM CYCLOSILICATE 5 G PO PACK
5.0000 g | PACK | Freq: Once | ORAL | Status: AC
  Administered 2021-04-21: 5 g via ORAL
  Filled 2021-04-21: qty 1

## 2021-04-21 MED ORDER — SODIUM CHLORIDE 0.9 % IV SOLN
2.0000 g | INTRAVENOUS | Status: DC
  Administered 2021-04-21 – 2021-04-22 (×2): 2 g via INTRAVENOUS
  Filled 2021-04-21 (×3): qty 20

## 2021-04-21 MED ORDER — HYDROMORPHONE HCL 1 MG/ML IJ SOLN
1.0000 mg | Freq: Once | INTRAMUSCULAR | Status: AC
  Administered 2021-04-21: 1 mg via INTRAVENOUS
  Filled 2021-04-21: qty 1

## 2021-04-21 MED ORDER — SENNOSIDES-DOCUSATE SODIUM 8.6-50 MG PO TABS
1.0000 | ORAL_TABLET | Freq: Two times a day (BID) | ORAL | Status: DC
  Administered 2021-04-21 – 2021-04-24 (×5): 1 via ORAL
  Filled 2021-04-21 (×5): qty 1

## 2021-04-21 MED ORDER — ONDANSETRON HCL 4 MG/2ML IJ SOLN
4.0000 mg | Freq: Once | INTRAMUSCULAR | Status: AC
  Administered 2021-04-21: 4 mg via INTRAVENOUS
  Filled 2021-04-21: qty 2

## 2021-04-21 MED ORDER — IOHEXOL 350 MG/ML SOLN
100.0000 mL | Freq: Once | INTRAVENOUS | Status: AC | PRN
  Administered 2021-04-21: 100 mL via INTRAVENOUS

## 2021-04-21 MED ORDER — ACETAMINOPHEN 325 MG PO TABS
650.0000 mg | ORAL_TABLET | Freq: Four times a day (QID) | ORAL | Status: DC | PRN
  Administered 2021-04-22: 650 mg via ORAL
  Filled 2021-04-21: qty 2

## 2021-04-21 MED ORDER — DEXTROSE 50 % IV SOLN
25.0000 g | Freq: Once | INTRAVENOUS | Status: AC
  Administered 2021-04-21: 25 g via INTRAVENOUS
  Filled 2021-04-21: qty 50

## 2021-04-21 MED ORDER — LIP MEDEX EX OINT
TOPICAL_OINTMENT | CUTANEOUS | Status: DC | PRN
  Administered 2021-04-21: 1 via TOPICAL
  Filled 2021-04-21: qty 7

## 2021-04-21 MED ORDER — CALCIUM GLUCONATE 10 % IV SOLN
1.0000 g | Freq: Once | INTRAVENOUS | Status: AC
  Administered 2021-04-21: 0.5 g via INTRAVENOUS
  Filled 2021-04-21: qty 10

## 2021-04-21 MED ORDER — ALBUMIN HUMAN 25 % IV SOLN
50.0000 g | Freq: Once | INTRAVENOUS | Status: AC
  Administered 2021-04-22: 50 g via INTRAVENOUS
  Filled 2021-04-21: qty 200

## 2021-04-21 MED ORDER — ALBUMIN HUMAN 25 % IV SOLN
50.0000 g | Freq: Once | INTRAVENOUS | Status: AC
  Administered 2021-04-21: 50 g via INTRAVENOUS
  Filled 2021-04-21: qty 200

## 2021-04-21 MED ORDER — POLYETHYLENE GLYCOL 3350 17 G PO PACK
17.0000 g | PACK | Freq: Every day | ORAL | Status: DC
  Administered 2021-04-21 – 2021-04-24 (×3): 17 g via ORAL
  Filled 2021-04-21 (×3): qty 1

## 2021-04-21 MED ORDER — HYDROMORPHONE HCL 2 MG PO TABS
1.0000 mg | ORAL_TABLET | ORAL | Status: DC | PRN
  Administered 2021-04-21 – 2021-04-24 (×10): 1 mg via ORAL
  Filled 2021-04-21 (×10): qty 1

## 2021-04-21 MED ORDER — TRAZODONE HCL 50 MG PO TABS
50.0000 mg | ORAL_TABLET | Freq: Once | ORAL | Status: AC
Start: 2021-04-22 — End: 2021-04-21
  Administered 2021-04-21: 50 mg via ORAL
  Filled 2021-04-21: qty 1

## 2021-04-21 MED ORDER — APIXABAN 5 MG PO TABS
5.0000 mg | ORAL_TABLET | Freq: Two times a day (BID) | ORAL | Status: DC
  Administered 2021-04-21 – 2021-04-22 (×2): 5 mg via ORAL
  Filled 2021-04-21 (×2): qty 1

## 2021-04-21 MED ORDER — PANTOPRAZOLE SODIUM 40 MG PO TBEC
40.0000 mg | DELAYED_RELEASE_TABLET | Freq: Every day | ORAL | Status: DC
  Administered 2021-04-21: 40 mg via ORAL
  Filled 2021-04-21: qty 1

## 2021-04-21 MED ORDER — TAMSULOSIN HCL 0.4 MG PO CAPS
0.4000 mg | ORAL_CAPSULE | Freq: Every day | ORAL | Status: DC
  Administered 2021-04-21 – 2021-04-24 (×3): 0.4 mg via ORAL
  Filled 2021-04-21 (×3): qty 1

## 2021-04-21 MED ORDER — SODIUM BICARBONATE 8.4 % IV SOLN
50.0000 meq | Freq: Once | INTRAVENOUS | Status: AC
  Administered 2021-04-21: 50 meq via INTRAVENOUS
  Filled 2021-04-21: qty 50

## 2021-04-21 MED ORDER — LACTATED RINGERS IV SOLN: INTRAVENOUS | Status: DC

## 2021-04-21 MED ORDER — ONDANSETRON HCL 4 MG/2ML IJ SOLN
4.0000 mg | Freq: Four times a day (QID) | INTRAMUSCULAR | Status: DC | PRN
  Administered 2021-04-22: 4 mg via INTRAVENOUS
  Filled 2021-04-21: qty 2

## 2021-04-21 MED ORDER — ALUM & MAG HYDROXIDE-SIMETH 200-200-20 MG/5ML PO SUSP
30.0000 mL | ORAL | Status: DC | PRN
  Administered 2021-04-21 – 2021-04-22 (×2): 30 mL via ORAL
  Filled 2021-04-21 (×2): qty 30

## 2021-04-21 MED ORDER — ACETAMINOPHEN 650 MG RE SUPP: 650.0000 mg | Freq: Four times a day (QID) | RECTAL | Status: DC | PRN

## 2021-04-21 NOTE — Progress Notes (Signed)
Patient received to floor from ED.  Transported via Biomedical scientist; esc per RN.  Pt transferred to the bed; max assist required.  AAOx4.  VSS.  No s/s of distress noted or reported at this time.  Report received from K. Bland Span, Therapist, sports.  Wife at side and reports will stay with pt tonight.

## 2021-04-21 NOTE — ED Provider Notes (Signed)
Emergency Medicine Provider Triage Evaluation Note  Travis Bowman , a 60 y.o. male  was evaluated in triage.  Pt complains of abdominal distention, shortness of breath, weakness following cancer treatment this last Thursday.  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  Patient arrives hypotensive he EMS although vital signs are stable.  Patient is taking oxycodone at home for pain..  Review of Systems  Positive: Shortness of breath, weakness, abdominal distention, abdominal pain Negative: Nausea, vomiting, diarrhea, constipation  Physical Exam  BP (!) 88/55 (BP Location: Left Arm)   Pulse 87   Temp 98.7 F (37.1 C) (Oral)   Resp 18   Ht 5\' 8"  (1.727 m)   Wt 100.7 kg   SpO2 94%   BMI 33.75 kg/m  Gen:   Awake, ill appearing, minimal distress Resp:  Normal effort, poor effort MSK:   Moves extremities without difficulty  Other:  Heart murmur auscultated, patient is tender to palpation in the abdomen, especially in the left upper quadrant, abdomen is distended, somewhat rigid, no rebound, no guarding  Medical Decision Making  Medically screening exam initiated at 7:16 AM.  Appropriate orders placed.  Shaun Runyon Faw was informed that the remainder of the evaluation will be completed by another provider, this initial triage assessment does not replace that evaluation, and the importance of remaining in the ED until their evaluation is complete.  Prostate cancer with metastases with abdominal distension, SOB, weakness   Anselmo Pickler, PA-C 04/21/21 [id number]    Lajean Saver, MD 04/23/21 [phone]

## 2021-04-21 NOTE — ED Triage Notes (Signed)
Pt to ED from home with c/o abdominal distention, SOB and weakness following his cancer treatment on Thursday. Pt has prostates cancer which has mets to the bones and lungs. Arrives hypotensive, all other VSS.

## 2021-04-21 NOTE — ED Provider Notes (Addendum)
[facility] DEPT Provider Note   CSN: [id number] Arrival date & time: 04/21/21  4098     History Chief Complaint  Patient presents with   abdominal distention    Travis Bowman is a 60 y.o. male.  Patient  w hx metastatic prostate cancer, c/o abd pain/distension getting progressive worse in past few weeks. Symptoms gradual onset, mod-severe, constant, persistent. Also notes entire body pain, states even hard/painful to speak, to move, to eat. Decreased appetite. Unsure of wt loss or gain. Denies chest pain. No cough or uri symptoms. No fever/chills/sweats. Is having small bms, occasionally passing gas. Notes decreased urine output. +bilateral leg edema. Hx dvt/pe - indicates compliant w anticoag therapy.   The history is provided by the patient, the spouse, medical records and the EMS personnel.      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 May 2015--  renal function recovered   Hypertension    Malignant essential hypertension    Prostate cancer ([place]) dx'd 08/2015   Right knee meniscal tear    Type 2 diabetes mellitus ([place])    Wears contact lenses     Patient Active Problem List   Diagnosis Date Noted   Acute pulmonary embolism, unspecified pulmonary embolism type, unspecified whether acute cor pulmonale present ([place]) 04/08/2021   COVID-19 03/19/2021   Goals of care,  counseling/discussion 06/04/2020   Malignant neoplasm of prostate ([place]) 12/25/2015   S/P right knee arthroscopy 02/08/2015   GERD (gastroesophageal reflux disease) 09/01/2014   Proteinuria 12/06/2013   Hematuria 12/06/2013   Elevated brain natriuretic peptide (BNP) level 12/06/2013   Hypertensive urgency, malignant 12/05/2013   DM (diabetes mellitus) ([place]) 12/05/2013   Hypertension 11/03/2011   Nephrolithiasis 11/03/2011    Past Surgical History:  Procedure Laterality Date   CATARACT EXTRACTION W/ INTRAOCULAR LENS  IMPLANT, BILATERAL  2003/  2006   EXTRACORPOREAL SHOCK WAVE LITHOTRIPSY  x5  last one 2006   IR PERC PLEURAL DRAIN W/INDWELL CATH W/IMG GUIDE  04/16/2021   KNEE ARTHROSCOPY WITH MEDIAL MENISECTOMY Right 02/08/2015   Procedure: RIGHT KNEE ARTHROSCOPY, PARTIAL MEDIAL MENISCECTOMY, CHRONDROPLASTY;  Surgeon: Sydnee Cabal, MD;  Location: [facility];  Service: Orthopedics;  Laterality: Right;   LYMPHADENECTOMY Bilateral 01/28/2016   Procedure: PELVIC LYMPHADENECTOMY;  Surgeon: Raynelle Bring, MD;  Location: [facility];  Service: Urology;  Laterality: Bilateral;   ORCHIECTOMY Right age 67   PERCUTANEOUS NEPHROSTOLITHOTOMY  x2  last one 25's   PROSTATE BIOPSY     ROBOT ASSISTED LAPAROSCOPIC RADICAL PROSTATECTOMY N/A 01/28/2016   Procedure: XI ROBOTIC ASSISTED LAPAROSCOPIC RADICAL PROSTATECTOMY LEVEL 2;  Surgeon: Raynelle Bring, MD;  Location: [facility];  Service: Urology;  Laterality: N/A;   TONSILLECTOMY AND ADENOIDECTOMY  as child   TRANSTHORACIC ECHOCARDIOGRAM  12-06-2013  mild LVH,  ef 50-55%,  mild LAE and RAE,  mild dilated aortic root   TRIGGER FINGER RELEASE Bilateral last one 2013   x 2   URETEROLITHOTOMY  29's       Family History  Problem Relation Age of Onset   Stroke Mother    CAD Mother    Alcohol abuse Father    Diabetes Brother    Marfan syndrome Brother     Social History   Tobacco Use   Smoking status: Light Smoker    Types: Cigars    Smokeless tobacco: Never   Tobacco comments:    currently occasoinal cigar--  quit cigarettes in 2007 had smoked for 25 yrs  Vaping Use   Vaping Use: Never used  Substance Use Topics   Alcohol use: Yes    Alcohol/week: 2.0 standard drinks    Types: 2 Cans of beer per week   Drug use: No    Home Medications Prior to Admission medications   Medication Sig Start Date End Date Taking? Authorizing Provider  amLODipine (NORVASC) 5 MG tablet Take 5 mg by mouth daily.   Yes [provider]  APIXABAN (ELIQUIS) VTE STARTER PACK (10MG AND 5MG) Take as directed on package: start with two-73m tablets twice daily for 7 days. On day 8, switch to one-573mtablet twice daily. 04/09/21  Yes Dahal, BiMarlowe AschoffMD  furosemide (LASIX) 40 MG tablet Take 1 tablet (40 mg total) by mouth daily. Patient taking differently: Take 40 mg by mouth 2 (two) times daily. 04/05/21  Yes Shadad, FiMathis DadMD  leuprolide (LUPRON DEPOT, 1-MONTH,) 30 MG injection Inject 30 mg into the muscle every 4 (four) months.   Yes [provider]  lisinopril (PRINIVIL,ZESTRIL) 40 MG tablet Take 20 mg by mouth every evening. 06/12/14  Yes [provider]  oxyCODONE (OXY IR/ROXICODONE) 5 MG immediate release tablet Take 1 tablet (5 mg total) by mouth every 4 (four) hours as needed for severe pain. 04/17/21  Yes Shadad, FiMathis DadMD  OYSCO 500 + D 500-200 MG-UNIT TABS TAKE 1 TABLET BY MOUTH TWICE DAILY Patient taking differently: Take 1 tablet by mouth 2 (two) times daily. 08/22/20  Yes ShWyatt PortelaMD  polyethylene glycol (MIRALAX) 17 g packet Take 17 g by mouth daily. 04/16/21  Yes ShWyatt PortelaMD  senna-docusate (SENNA S) 8.6-50 MG tablet Take 1 tablet by mouth 2 (two) times daily. 04/16/21  Yes ShWyatt PortelaMD  tamsulosin (FLOMAX) 0.4 MG CAPS capsule Take 1 capsule (0.4 mg total) by mouth daily. 03/22/21  Yes GrPatrecia PourMD  Blood Glucose Monitoring Suppl (ACCU-CHEK GUIDE) w/Device KIT Use to test your  blood sugar 11/25/18   [provider]  Lancets Micro Thin 33G MISC Use to check your blood sugar 11/25/18   [provider]    Allergies    Morphine and related  Review of Systems   Review of Systems  Constitutional:  Positive for appetite change. Negative for fever.  HENT:  Negative for sore throat.   Eyes:  Negative for visual disturbance.  Respiratory:  Negative for cough and shortness of breath.   Cardiovascular:  Negative for chest pain.  Gastrointestinal:  Positive for abdominal distention and abdominal pain. Negative for vomiting.  Genitourinary:  Positive for decreased urine volume. Negative for dysuria.  Musculoskeletal:  Positive for back pain.  Skin:  Negative for rash.  Neurological:  Positive for weakness. Negative for headaches.  Hematological:  Denies recent abnormal bleeding.   Psychiatric/Behavioral:  Negative for confusion.    Physical Exam Updated Vital Signs BP (!) 99/54   Pulse 84   Temp 98.7 F (37.1 C) (Oral)   Resp 19   Ht 1.727 m ('5\' 8"' )   Wt 100.7 kg   SpO2 92%   BMI 33.75 kg/m   Physical Exam Vitals and nursing note reviewed.  Constitutional:      Appearance: He is well-developed.     Comments: Weak/ill appearing.   HENT:     Head: Atraumatic.     Nose: Nose normal.     Mouth/Throat:     Mouth: Mucous membranes are moist.     Pharynx: Oropharynx is clear.  Eyes:     General: No scleral icterus.    Conjunctiva/sclera: Conjunctivae normal.     Pupils: Pupils are equal, round, and reactive to light.  Neck:     Trachea: No tracheal deviation.  Cardiovascular:     Rate and Rhythm: Normal rate and regular rhythm.     Pulses: Normal pulses.     Heart sounds: Normal heart sounds. No murmur heard.   No friction rub. No gallop.  Pulmonary:     Effort: Pulmonary effort is normal. No accessory muscle usage or respiratory distress.     Breath sounds: Normal breath sounds.     Comments: Pleurx cath right chest without  sign of infection Abdominal:     General: Bowel sounds are normal. There is distension.     Palpations: Abdomen is soft.     Tenderness: There is abdominal tenderness. There is no guarding or rebound.     Hernia: No hernia is present.     Comments: Mild tenderness. Diffusely distended, +ascites. No peritoneal signs.  Genitourinary:    Comments: No cva tenderness. Musculoskeletal:     Cervical back: Normal range of motion and neck supple. No rigidity.     Right lower leg: Edema present.     Left lower leg: Edema present.     Comments: Diffuse, symmetric, bilateral leg swelling to abd.   Skin:    General: Skin is warm and dry.     Findings: No rash.  Neurological:     Mental Status: He is alert.     Comments: Alert, speech clear. Motor/sens grossly intact bil.   Psychiatric:     Comments: Depressed mood.     ED Results / Procedures / Treatments   Labs (all labs ordered are listed, but only abnormal results are displayed) Results for orders placed or performed during the hospital encounter of 04/21/21  Resp Panel by RT-PCR (Flu A&B, Covid) Nasopharyngeal Swab   Specimen: Nasopharyngeal Swab; Nasopharyngeal(NP) swabs in vial transport medium  Result Value Ref Range   SARS Coronavirus 2 by RT PCR NEGATIVE NEGATIVE   Influenza A by PCR NEGATIVE NEGATIVE   Influenza B by PCR NEGATIVE NEGATIVE  CBC with Differential  Result Value Ref Range   WBC 12.1 (H) 4.0 - 10.5 K/uL   RBC 2.99 (L) 4.22 - 5.81 MIL/uL   Hemoglobin 8.7 (L) 13.0 - 17.0 g/dL   HCT 28.7 (L) 39.0 - 52.0 %   MCV 96.0 80.0 - 100.0 fL   MCH 29.1 26.0 - 34.0 pg   MCHC 30.3 30.0 - 36.0 g/dL   RDW 19.9 (H) 11.5 - 15.5 %   Platelets 262 150 - 400 K/uL   nRBC 0.0 0.0 - 0.2 %   Neutrophils Relative % 85 %  Neutro Abs 10.3 (H) 1.7 - 7.7 K/uL   Lymphocytes Relative 6 %   Lymphs Abs 0.7 0.7 - 4.0 K/uL   Monocytes Relative 7 %   Monocytes Absolute 0.9 0.1 - 1.0 K/uL   Eosinophils Relative 0 %   Eosinophils Absolute 0.0  0.0 - 0.5 K/uL   Basophils Relative 0 %   Basophils Absolute 0.0 0.0 - 0.1 K/uL   Immature Granulocytes 2 %   Abs Immature Granulocytes 0.22 (H) 0.00 - 0.07 K/uL  Comprehensive metabolic panel  Result Value Ref Range   Sodium 126 (L) 135 - 145 mmol/L   Potassium 5.7 (H) 3.5 - 5.1 mmol/L   Chloride 96 (L) 98 - 111 mmol/L   CO2 19 (L) 22 - 32 mmol/L   Glucose, Bld 113 (H) 70 - 99 mg/dL   BUN 83 (H) 6 - 20 mg/dL   Creatinine, Ser 1.34 (H) 0.61 - 1.24 mg/dL   Calcium 8.4 (L) 8.9 - 10.3 mg/dL   Total Protein 6.4 (L) 6.5 - 8.1 g/dL   Albumin 1.8 (L) 3.5 - 5.0 g/dL   AST 104 (H) 15 - 41 U/L   ALT 15 0 - 44 U/L   Alkaline Phosphatase 697 (H) 38 - 126 U/L   Total Bilirubin 0.8 0.3 - 1.2 mg/dL   GFR, Estimated >60 >60 mL/min   Anion gap 11 5 - 15  Lactic acid, plasma  Result Value Ref Range   Lactic Acid, Venous 3.1 (HH) 0.5 - 1.9 mmol/L  Lipase, blood  Result Value Ref Range   Lipase 23 11 - 51 U/L   DG Chest 2 View  Result Date: 04/08/2021 CLINICAL DATA:  Shortness of breath. EXAM: CHEST - 2 VIEW COMPARISON:  03/21/2021 FINDINGS: Patchy bilateral airspace disease noted right mid lung and left mid and lower lung. Stable asymmetric elevation right hemidiaphragm. Tiny right pleural effusion. Cardiopericardial silhouette is at upper limits of normal for size. Telemetry leads overlie the chest. IMPRESSION: Patchy bilateral airspace disease with tiny right pleural effusion. Multifocal pneumonia concern. Electronically Signed   By: Misty Stanley M.D.   On: 04/08/2021 11:01   CT Angio Chest PE W/Cm &/Or Wo Cm  Result Date: 04/08/2021 CLINICAL DATA:  Shortness of breath with abdominal and scrotal swelling for 2-3 weeks. History of prostate cancer. Clinical concern for pulmonary embolism. EXAM: CT ANGIOGRAPHY CHEST CT ABDOMEN AND PELVIS WITH CONTRAST TECHNIQUE: Multidetector CT imaging of the chest was performed using the standard protocol during bolus administration of intravenous contrast.  Multiplanar CT image reconstructions and MIPs were obtained to evaluate the vascular anatomy. Multidetector CT imaging of the abdomen and pelvis was performed using the standard protocol during bolus administration of intravenous contrast. CONTRAST:  12m OMNIPAQUE IOHEXOL 350 MG/ML SOLN COMPARISON:  Chest CTA 03/21/2021. Chest CT 03/19/2021, PET-CT 12/19/2020 and abdominal CT 06/01/2020. FINDINGS: CTA CHEST FINDINGS Cardiovascular: The pulmonary arteries are suboptimally opacified with contrast to the level of the subsegmental branches. There is probable acute nonocclusive thrombus within the main and upper lobe lobar branches of the right pulmonary artery. In addition, there are probable small emboli within segmental branches of the right lower lobe. No definite left-sided emboli are seen. No significant right ventricular dilatation the heart size is normal. There is no significant pericardial effusion. Atherosclerosis of the aorta, great vessels and coronary arteries noted. Mediastinum/Nodes: Again demonstrated are multiple enlarged mediastinal, hilar and left supraclavicular lymph nodes. There is a 1.5 cm AP window node on image 28/4 and a 2.4  cm subcarinal node on image 38/4. There are small left axillary lymph nodes. Overall, this thoracic adenopathy appears similar to recent previous study. The thyroid gland, trachea and esophagus demonstrate no significant findings. Lungs/Pleura: Moderate-sized dependent right pleural effusion has mildly decreased in volume compared with the recent prior study. No pneumothorax or significant left pleural effusion. There is stable atelectasis at the right lung base. Underlying emphysematous changes and diffuse subpleural reticulation are noted. There is increased ill-defined nodularity peripherally in the right upper lobe with a component measuring up to 1.4 cm on image 35/6. There is additional nodularity in the left lower lobe, measuring up to 9 mm on image 60/6. These  nodules have enlarged compared with older prior studies and are suspicious for metastases. Musculoskeletal/Chest wall: Widespread osseous metastatic disease to the ribs and thoracic spine with multiple pathologic rib fractures, similar to recent CT. No significant pathologic fracture or epidural tumor identified in the spine. CT ABDOMEN AND PELVIS FINDINGS Hepatobiliary: Widespread hepatic metastatic disease is again noted with approximately 50% replacement of the normal hepatic parenchyma. This appears similar to the recent chest CT and is new compared with the abdominal CT from 06/01/2020. The liver is mildly enlarged. Possible small calcified gallstone. No significant gallbladder wall thickening or biliary dilatation. Pancreas: Unremarkable. No pancreatic ductal dilatation or surrounding inflammatory changes. Spleen: Normal in size without focal abnormality. Adrenals/Urinary Tract: Both adrenal glands appear normal. Several low-density renal lesions are similar to prior abdominal CT and likely cysts. No definite enhancing renal mass. No evidence of urinary tract calculus or hydronephrosis. The bladder appears unchanged. Stomach/Bowel: There may be a small amount of enteric contrast within the small bowel. No evidence of bowel distension, wall thickening or focal surrounding inflammation. The appendix appears normal. Vascular/Lymphatic: There are multiple enlarged retroperitoneal and pelvic lymph nodes, including a 3.1 cm portacaval node on image 41/2, a 1.7 cm right pericaval node on image 42/2 and a 1.6 cm left inguinal node on image 88/2. Aortic and branch vessel atherosclerosis without aneurysm or large vessel occlusion. No venous thrombosis identified. Reproductive: Status post prostatectomy without recurrent mass lesion. Other: Interval development of ascites with diffuse subcutaneous edema. There are multiple peritoneal and omental soft tissue nodules consistent with widespread peritoneal metastatic  disease. Representative lesions include a 5.9 x 2.0 cm right upper quadrant peritoneal nodule on image 46/2 and a central omental implant measuring 5.8 x 2.5 cm on image 61/2. Musculoskeletal: Widespread osseous metastatic disease with pathologic fracture involving the left pubic rami. No pathologic fracture or significant epidural tumor seen in the lumbar spine. Review of the MIP images confirms the above findings. IMPRESSION: 1. Study is positive for lobar and segmental pulmonary emboli on the right. No signs of right heart strain. 2. Widespread metastatic disease to multiple lymph nodes in the chest, abdomen and pelvis, the liver, the peritoneum and multiple bones. There are pathologic fractures of multiple ribs and the left pubic rami. Ascites with multiple peritoneal metastases. 3. No evidence of hydronephrosis or bowel obstruction. 4. Moderate size right pleural effusion has mildly decreased in size compared with recent CT. 5. Critical Value/emergent results were called by telephone at the time of interpretation on 04/08/2021 at 2:40 pm to provider Vivi Martens, who verbally acknowledged these results. Electronically Signed   By: Richardean Sale M.D.   On: 04/08/2021 14:41   [place] CHEST (PLEURAL EFFUSION)  Result Date: 04/15/2021 CLINICAL DATA:  Question resolving pleural effusion EXAM: CHEST ULTRASOUND COMPARISON:  CT 04/08/2021 FINDINGS: Ultrasound of the  right chest demonstrates moderate right pleural effusion, likely not significantly changed since prior CT. No visible left effusion. IMPRESSION: Moderate right pleural effusion, likely not significantly changed since prior CT. Electronically Signed   By: Rolm Baptise M.D.   On: 04/15/2021 22:35   CT ABDOMEN PELVIS W CONTRAST  Result Date: 04/21/2021 CLINICAL DATA:  LEFT lower quadrant abdominal pain in a 60 year old male with diagnosis of peritoneal carcinomatosis and widespread metastatic disease in the setting of prostate neoplasm. EXAM: CT ABDOMEN  AND PELVIS WITH CONTRAST TECHNIQUE: Multidetector CT imaging of the abdomen and pelvis was performed using the standard protocol following bolus administration of intravenous contrast. CONTRAST:  175m OMNIPAQUE IOHEXOL 350 MG/ML SOLN COMPARISON:  April 08, 2021. FINDINGS: Lower chest: Interval placement of a PleurX catheter into the RIGHT chest, tip in the medial aspect of the RIGHT costodiaphragmatic sulcus. Diminished RIGHT-sided pleural effusion. Adenopathy in the chest and pulmonary nodules in the chest with little changed compared to very recent imaging. Signs of coronary artery calcification and aortic valvular calcification as before. Hepatobiliary: Extensive hepatic involvement also not substantially changed in the very short interval. No intrahepatic biliary duct distension. Portal vein is patent. No pericholecystic stranding. Pancreas: Normal, without mass, inflammation or ductal dilatation. Spleen: Spleen normal size and contour. Adrenals/Urinary Tract: Adrenal glands are normal. Symmetric renal enhancement with small renal cysts bilaterally. No hydronephrosis. Post prostatectomy with urinary bladder at the pelvic floor as before. Urinary bladder under distended without gross adjacent stranding. Stomach/Bowel: No sign of bowel obstruction or acute bowel process in the setting of diffuse peritoneal and omental disease. There is increase in ascites compared to previous imaging, bowel floats in this ascites. Appendix not seen but no pericecal inflammation to suggest appendicitis. Vascular/Lymphatic: Atherosclerosis of the abdominal aorta without aneurysmal dilation. Adenopathy in the retroperitoneum and about the porta hepatis and celiac with no substantial change. (Image 39/2) 19 mm periportal lymph node above the pancreatic head previously 18 mm. Bulky portacaval lymph node (image 43/2) 30 mm short axis, unchanged. IVC with smooth contours. Signs of pelvic lymphadenectomy without pelvic mass.  Reproductive: Post prostatectomy. Other: Increase in volume of ascites since previous imaging. Omental caking with similar appearance, dominant area 5.8 x 2.9 cm (image 64/2) stable when measured in a similar fashion by this observer on the previous study with other nodules without gross change. No free air. Musculoskeletal: Diffuse skeletal metastatic disease little changed. Pathologic fracture of LEFT pubic bone superior and inferior pubic ramus as before. Rib destruction and pathologic fracture along the RIGHT posterolateral chest involving RIGHT ninth and tenth ribs with similar appearance. Body wall edema may be slightly increased. IMPRESSION: Interval placement of PleurX catheter into the RIGHT chest with diminished pleural effusion as described. Increase in volume of ascites since previous imaging. Otherwise, no significant interval change or acute findings. Unchanged appearance of pathologic fracture of the LEFT pubic bone. Aortic Atherosclerosis (ICD10-I70.0). Electronically Signed   By: GZetta BillsM.D.   On: 04/21/2021 09:55   CT Abdomen Pelvis W Contrast  Result Date: 04/08/2021 CLINICAL DATA:  Shortness of breath with abdominal and scrotal swelling for 2-3 weeks. History of prostate cancer. Clinical concern for pulmonary embolism. EXAM: CT ANGIOGRAPHY CHEST CT ABDOMEN AND PELVIS WITH CONTRAST TECHNIQUE: Multidetector CT imaging of the chest was performed using the standard protocol during bolus administration of intravenous contrast. Multiplanar CT image reconstructions and MIPs were obtained to evaluate the vascular anatomy. Multidetector CT imaging of the abdomen and pelvis was performed using the standard  protocol during bolus administration of intravenous contrast. CONTRAST:  2m OMNIPAQUE IOHEXOL 350 MG/ML SOLN COMPARISON:  Chest CTA 03/21/2021. Chest CT 03/19/2021, PET-CT 12/19/2020 and abdominal CT 06/01/2020. FINDINGS: CTA CHEST FINDINGS Cardiovascular: The pulmonary arteries are  suboptimally opacified with contrast to the level of the subsegmental branches. There is probable acute nonocclusive thrombus within the main and upper lobe lobar branches of the right pulmonary artery. In addition, there are probable small emboli within segmental branches of the right lower lobe. No definite left-sided emboli are seen. No significant right ventricular dilatation the heart size is normal. There is no significant pericardial effusion. Atherosclerosis of the aorta, great vessels and coronary arteries noted. Mediastinum/Nodes: Again demonstrated are multiple enlarged mediastinal, hilar and left supraclavicular lymph nodes. There is a 1.5 cm AP window node on image 28/4 and a 2.4 cm subcarinal node on image 38/4. There are small left axillary lymph nodes. Overall, this thoracic adenopathy appears similar to recent previous study. The thyroid gland, trachea and esophagus demonstrate no significant findings. Lungs/Pleura: Moderate-sized dependent right pleural effusion has mildly decreased in volume compared with the recent prior study. No pneumothorax or significant left pleural effusion. There is stable atelectasis at the right lung base. Underlying emphysematous changes and diffuse subpleural reticulation are noted. There is increased ill-defined nodularity peripherally in the right upper lobe with a component measuring up to 1.4 cm on image 35/6. There is additional nodularity in the left lower lobe, measuring up to 9 mm on image 60/6. These nodules have enlarged compared with older prior studies and are suspicious for metastases. Musculoskeletal/Chest wall: Widespread osseous metastatic disease to the ribs and thoracic spine with multiple pathologic rib fractures, similar to recent CT. No significant pathologic fracture or epidural tumor identified in the spine. CT ABDOMEN AND PELVIS FINDINGS Hepatobiliary: Widespread hepatic metastatic disease is again noted with approximately 50% replacement of the  normal hepatic parenchyma. This appears similar to the recent chest CT and is new compared with the abdominal CT from 06/01/2020. The liver is mildly enlarged. Possible small calcified gallstone. No significant gallbladder wall thickening or biliary dilatation. Pancreas: Unremarkable. No pancreatic ductal dilatation or surrounding inflammatory changes. Spleen: Normal in size without focal abnormality. Adrenals/Urinary Tract: Both adrenal glands appear normal. Several low-density renal lesions are similar to prior abdominal CT and likely cysts. No definite enhancing renal mass. No evidence of urinary tract calculus or hydronephrosis. The bladder appears unchanged. Stomach/Bowel: There may be a small amount of enteric contrast within the small bowel. No evidence of bowel distension, wall thickening or focal surrounding inflammation. The appendix appears normal. Vascular/Lymphatic: There are multiple enlarged retroperitoneal and pelvic lymph nodes, including a 3.1 cm portacaval node on image 41/2, a 1.7 cm right pericaval node on image 42/2 and a 1.6 cm left inguinal node on image 88/2. Aortic and branch vessel atherosclerosis without aneurysm or large vessel occlusion. No venous thrombosis identified. Reproductive: Status post prostatectomy without recurrent mass lesion. Other: Interval development of ascites with diffuse subcutaneous edema. There are multiple peritoneal and omental soft tissue nodules consistent with widespread peritoneal metastatic disease. Representative lesions include a 5.9 x 2.0 cm right upper quadrant peritoneal nodule on image 46/2 and a central omental implant measuring 5.8 x 2.5 cm on image 61/2. Musculoskeletal: Widespread osseous metastatic disease with pathologic fracture involving the left pubic rami. No pathologic fracture or significant epidural tumor seen in the lumbar spine. Review of the MIP images confirms the above findings. IMPRESSION: 1. Study is positive for lobar and  segmental  pulmonary emboli on the right. No signs of right heart strain. 2. Widespread metastatic disease to multiple lymph nodes in the chest, abdomen and pelvis, the liver, the peritoneum and multiple bones. There are pathologic fractures of multiple ribs and the left pubic rami. Ascites with multiple peritoneal metastases. 3. No evidence of hydronephrosis or bowel obstruction. 4. Moderate size right pleural effusion has mildly decreased in size compared with recent CT. 5. Critical Value/emergent results were called by telephone at the time of interpretation on 04/08/2021 at 2:40 pm to provider Vivi Martens, who verbally acknowledged these results. Electronically Signed   By: Richardean Sale M.D.   On: 04/08/2021 14:41   DG Chest Port 1 View  Result Date: 04/21/2021 CLINICAL DATA:  Abdominal distension, shortness of breath. EXAM: PORTABLE CHEST 1 VIEW COMPARISON:  04/06/2021 FINDINGS: Stable cardiomediastinal contours. There is a right sided chest tube. No pneumothorax or pleural effusion identified. Atelectasis versus scarring within the left midlung appears unchanged. Hazy opacity within the periphery of the right upper lobe is also unchanged. IMPRESSION: 1. Stable exam. No pneumothorax with right chest tube in place. 2. No change in right upper hazy lung opacity and left midlung scar versus atelectasis. Electronically Signed   By: Kerby Moors M.D.   On: 04/21/2021 09:48   DG Chest Port 1 View  Result Date: 04/16/2021 CLINICAL DATA:  Postprocedure.  Chest tube placement. EXAM: PORTABLE CHEST 1 VIEW COMPARISON:  Chest radiograph, 04/08/2021.  CT chest, 04/08/2021. FINDINGS: Support lines: RIGHT basilar-directed thoracostomy tube. Cardiac silhouette enlargement. Hypoinflation. Perihilar and interstitial opacities. No focal consolidation or mass. No residual RIGHT pleural effusion. Trace RIGHT pneumothorax, best appreciated at the periphery. No acute osseous abnormality. IMPRESSION: 1. Trace RIGHT apical  pneumothorax. 2. Well-positioned RIGHT basilar pleural catheter placement. No residual RIGHT pleural. Electronically Signed   By: Michaelle Birks M.D.   On: 04/16/2021 12:12   ECHOCARDIOGRAM COMPLETE  Result Date: 03/22/2021    ECHOCARDIOGRAM REPORT   Patient Name:   Travis Bowman [facility] Date of Exam: 03/22/2021 Medical Rec #:  [id number]      Height:       68.0 in Accession #:    [id number]     Weight:       221.1 lb Date of Birth:  Oct 19, 1960      BSA:          2.133 m Patient Age:    33 years       BP:           135/68 mmHg Patient Gender: M              HR:           79 bpm. Exam Location:  Inpatient Procedure: 2D Echo, Cardiac Doppler and Color Doppler Indications:    Acutre respiratory distress R06.03                 Dyspnea R06.00  History:        Patient has prior history of Echocardiogram examinations, most                 recent 12/06/2013. CHF; Risk Factors:Hypertension and Diabetes.  Sonographer:    Bernadene Person RDCS Referring Phys: Lucien  1. Left ventricular ejection fraction, by estimation, is 65 to 70%. The left ventricle has normal function. The left ventricle has no regional wall motion abnormalities. There is mild concentric left ventricular hypertrophy. Left ventricular diastolic parameters were normal.  2.  Right ventricular systolic function is normal. The right ventricular size is normal. Tricuspid regurgitation signal is inadequate for assessing PA pressure.  3. The mitral valve is normal in structure. Trivial mitral valve regurgitation. No evidence of mitral stenosis.  4. The aortic valve is tricuspid. There is moderate calcification of the aortic valve. There is mild thickening of the aortic valve. Aortic valve regurgitation is mild. Mild aortic valve sclerosis is present, with no evidence of aortic valve stenosis.  5. The inferior vena cava is dilated in size with >50% respiratory variability, suggesting right atrial pressure of 8 mmHg. Comparison(s): Changes from prior  study are noted. Mild AR not seen on prior study. Conclusion(s)/Recommendation(s): Otherwise normal echocardiogram, with minor abnormalities described in the report. FINDINGS  Left Ventricle: Left ventricular ejection fraction, by estimation, is 65 to 70%. The left ventricle has normal function. The left ventricle has no regional wall motion abnormalities. The left ventricular internal cavity size was normal in size. There is  mild concentric left ventricular hypertrophy. Left ventricular diastolic parameters were normal. Right Ventricle: The right ventricular size is normal. No increase in right ventricular wall thickness. Right ventricular systolic function is normal. Tricuspid regurgitation signal is inadequate for assessing PA pressure. Left Atrium: Left atrial size was normal in size. Right Atrium: Right atrial size was normal in size. Pericardium: There is no evidence of pericardial effusion. Mitral Valve: The mitral valve is normal in structure. Trivial mitral valve regurgitation. No evidence of mitral valve stenosis. Tricuspid Valve: The tricuspid valve is normal in structure. Tricuspid valve regurgitation is trivial. No evidence of tricuspid stenosis. Aortic Valve: The aortic valve is tricuspid. There is moderate calcification of the aortic valve. There is mild thickening of the aortic valve. Aortic valve regurgitation is mild. Aortic regurgitation PHT measures 457 msec. Mild aortic valve sclerosis is  present, with no evidence of aortic valve stenosis. Pulmonic Valve: The pulmonic valve was not well visualized. Pulmonic valve regurgitation is not visualized. No evidence of pulmonic stenosis. Aorta: The aortic root, ascending aorta and aortic arch are all structurally normal, with no evidence of dilitation or obstruction. Venous: The inferior vena cava is dilated in size with greater than 50% respiratory variability, suggesting right atrial pressure of 8 mmHg. IAS/Shunts: The atrial septum is grossly  normal.  LEFT VENTRICLE PLAX 2D LVIDd:         4.20 cm  Diastology LVIDs:         2.50 cm  LV e' medial:    7.51 cm/s LV PW:         1.30 cm  LV E/e' medial:  8.5 LV IVS:        1.20 cm  LV e' lateral:   6.96 cm/s LVOT diam:     2.10 cm  LV E/e' lateral: 9.2 LV SV:         78 LV SV Index:   36 LVOT Area:     3.46 cm  RIGHT VENTRICLE RV S prime:     15.40 cm/s TAPSE (M-mode): 2.3 cm LEFT ATRIUM             Index       RIGHT ATRIUM           Index LA diam:        3.30 cm 1.55 cm/m  RA Area:     12.20 cm LA Vol (A2C):   43.7 ml 20.49 ml/m RA Volume:   26.70 ml  12.52 ml/m LA Vol (A4C):  56.3 ml 26.40 ml/m LA Biplane Vol: 49.0 ml 22.97 ml/m  AORTIC VALVE LVOT Vmax:   129.00 cm/s LVOT Vmean:  83.100 cm/s LVOT VTI:    0.224 m AI PHT:      457 msec  AORTA Ao Root diam: 3.50 cm Ao Asc diam:  3.60 cm MITRAL VALVE MV Area (PHT): 4.21 cm     SHUNTS MV Decel Time: 180 msec     Systemic VTI:  0.22 m MV E velocity: 64.00 cm/s   Systemic Diam: 2.10 cm MV A velocity: 129.00 cm/s MV E/A ratio:  0.50 Buford Dresser MD Electronically signed by Buford Dresser MD Signature Date/Time: 03/22/2021/5:45:12 PM    Final    VAS [place] LOWER EXTREMITY VENOUS (DVT)  Result Date: 04/09/2021  Lower Venous DVT Study Patient Name:  Travis Bowman [facility]  Date of Exam:   04/09/2021 Medical Rec #: [id number]       Accession #:    [id number] Date of Birth: Jun 27, 1961       Patient Gender: M Patient Age:   74 years Exam Location:  [facility] Procedure:      VAS [place] LOWER EXTREMITY VENOUS (DVT) Referring Phys: Terrilee Croak --------------------------------------------------------------------------------  Indications: Pulmonary embolism.  Risk Factors: Confirmed PE Cancer. Anticoagulation: Heparin. Limitations: Body habitus, poor ultrasound/tissue interface and Left extensive lymphedema. Comparison Study: 03/20/2021 - Negative for DVT. Performing Technologist: Oliver Hum RVT  Examination Guidelines: A complete evaluation includes  B-mode imaging, spectral Doppler, color Doppler, and power Doppler as needed of all accessible portions of each vessel. Bilateral testing is considered an integral part of a complete examination. Limited examinations for reoccurring indications may be performed as noted. The reflux portion of the exam is performed with the patient in reverse Trendelenburg.  +---------+---------------+---------+-----------+----------+--------------+ RIGHT    CompressibilityPhasicitySpontaneityPropertiesThrombus Aging +---------+---------------+---------+-----------+----------+--------------+ CFV      Full           Yes      Yes                                 +---------+---------------+---------+-----------+----------+--------------+ SFJ      Full                                                        +---------+---------------+---------+-----------+----------+--------------+ FV Prox  Full                                                        +---------+---------------+---------+-----------+----------+--------------+ FV Mid   Full                                                        +---------+---------------+---------+-----------+----------+--------------+ FV DistalFull                                                        +---------+---------------+---------+-----------+----------+--------------+  PFV      Full                                                        +---------+---------------+---------+-----------+----------+--------------+ POP      Full           Yes      Yes                                 +---------+---------------+---------+-----------+----------+--------------+ PTV      Full                                                        +---------+---------------+---------+-----------+----------+--------------+ PERO     Full                                                         +---------+---------------+---------+-----------+----------+--------------+   +---------+---------------+---------+-----------+----------+-------------------+ LEFT     CompressibilityPhasicitySpontaneityPropertiesThrombus Aging      +---------+---------------+---------+-----------+----------+-------------------+ CFV      None           No       No                   Acute               +---------+---------------+---------+-----------+----------+-------------------+ FV Prox  None           No       No                   Acute               +---------+---------------+---------+-----------+----------+-------------------+ FV Mid   None           No       No                   Acute               +---------+---------------+---------+-----------+----------+-------------------+ FV DistalNone           No       No                   Acute               +---------+---------------+---------+-----------+----------+-------------------+ PFV                                                   Not well visualized +---------+---------------+---------+-----------+----------+-------------------+ POP      Full           Yes      No                                       +---------+---------------+---------+-----------+----------+-------------------+  PTV      Full                                                             +---------+---------------+---------+-----------+----------+-------------------+ PERO     Full                                                             +---------+---------------+---------+-----------+----------+-------------------+ EIV                     Yes      No                                       +---------+---------------+---------+-----------+----------+-------------------+     Summary: RIGHT: - There is no evidence of deep vein thrombosis in the lower extremity.  - No cystic structure found in the popliteal fossa.  LEFT: - Findings  consistent with acute deep vein thrombosis involving the left common femoral vein, and left femoral vein. - No cystic structure found in the popliteal fossa.  *See table(s) above for measurements and observations. Electronically signed by Servando Snare MD on 04/09/2021 at 1:50:22 PM.    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 Patient status post radiotherapy, therapy, and chemotherapy. Progressive disease with increasing PSA. New malignant pleural effusion. Prior indication metastatic adenopathy and bone metastasis. EXAM: NUCLEAR MEDICINE PLUVICTO INJECTION TECHNIQUE: Infusion: The nuclear medicine technologist and I personally verified the dose activity to be delivered as specified in the written directive, and verified the patient identification via 2 separate methods. Initial flush of the intravenous catheter was performed was sterile saline. The dose syringe was connected to the catheter and the Lu-177 Pluvicto administered over a 1 to 10 min infusion. Single 10 cc lushes with normal saline follow the dose. No complications were noted. The entire IV tubing, venocatheter, stopcock and syringes was removed in total, placed in a disposal bag and sent for assay of the residual activity, which will be reported at a later time in our EMR by the physics staff. Pressure was applied to the venipuncture site, and a compression bandage placed. Patient monitored for 1 hour following infusion. 1.5 L normal saline was administered intravenous during treatment cycle. Radiation Safety personnel were present to perform the discharge survey, as detailed on their documentation. After a short period of observation, the patient had his IV removed. RADIOPHARMACEUTICALS:  Two hundred one microcuries Lu-177 PLUVICTO FINDINGS: Current Infusion: 1 Planned Infusions: 6 Patient presented to nuclear medicine for treatment. Risk and  benefits of procedure were explained to patient and his wife. Patient reports back pain prior therapy. The patient's same day blood counts and metabolic panel were reviewed and remains a good candidate to proceed with Lu-177 Pluvicto. Patient has mild renal insufficiency and instructed to hydrate well following therapy. The patient was situated in an infusion suite with a contact barrier placed under the arm. Intravenous access was established, using  sterile technique, and a normal saline infusion from a syringe was started. Micro-dosimetry: The prescribed radiation activity was assayed and confirmed to be within specified tolerance. IMPRESSION: Current Infusion: 1 Planned Infusions: 6 The patient tolerated the infusion well. Patient to return to oncologists in 5 weeks for pre therapy labs. Patient to return to molecular therapeutic department at [facility] in 6 weeks for next therapy. Electronically Signed   By: Suzy Bouchard M.D.   On: 04/18/2021 14:15   IR PERC PLEURAL DRAIN W/INDWELL CATH W/IMG GUIDE  Result Date: 04/16/2021 CLINICAL DATA:  Recurrent, malignant RIGHT pleural effusion. EXAM: TUNNELED RIGHT PLEURAL DRAINAGE CATHETER PLACEMENT (PleurX) COMPARISON:  Chest radiograph, concurrent.  CT chest, 04/08/2021. MEDICATIONS: 2 g Ancef IV. The antibiotics were administered within an appropriate time interval for the procedure. ANESTHESIA/SEDATION: Moderate (conscious) sedation was employed during this procedure. A total of Versed 2 mg and Fentanyl 100 mcg was administered intravenously. Moderate Sedation Time: 28 minutes. The patient's level of consciousness and vital signs were monitored continuously by radiology nursing throughout the procedure under my direct supervision. FLUOROSCOPY TIME:  FLUOROSCOPY TIME 0 min 24 seconds (7 mGy) COMPLICATIONS: None immediate. PROCEDURE: The procedure, risks, benefits, and alternatives were explained to the patient, who wishes to proceed with the placement of this  permanent pleural catheter as they are seeking palliative care. The patient understands and consents to the procedure. The RIGHT lateral chest and upper abdomen were prepped and draped in a sterile fashion, and a sterile drape was applied covering the operative field. A sterile gown and sterile gloves were used for the procedure. Initial ultrasound scanning and fluoroscopic imaging demonstrates a recurrent moderate to large pleural effusion. Under direct ultrasound guidance, the inferior lateral pleural space was accessed with a Yueh sheath needle after the overlying soft tissues were anesthetized with 1% lidocaine with epinephrine. An Amplatz super stiff wire was then advanced under fluoroscopy into the pleural space. A 15.5 French tunneled Pleur-X catheter was tunneled from an incision within the right upper abdominal quadrant to the access site. The pleural access site was serially dilated under fluoroscopy, ultimately allowing placement of a peel-away sheath. The catheter was advanced through the peel-away sheath. The sheath was then removed. Final catheter positioning was confirmed with a fluoroscopic radiographic image. The access incision was closed with an interrupted subcutaneous subcuticular 3-0 Vicryl, then Dermabond was applied at the skin. An 0-silk retention suture was applied at the catheter exit site. Thoracentesis was performed through the new catheter utilizing provided bulb vacuum assisted drainage bag. Dressings were applied. The patient tolerated the above procedure well without immediate postprocedural complication. FINDINGS: Preprocedural ultrasound scanning demonstrates a recurrent moderate-sized RIGHT sided pleural effusion. After ultrasound and fluoroscopic guided placement, the catheter is directed medial and caudad IMPRESSION: Successful placement of a tunneled RIGHT pleural drainage (PleurX) catheter via lateral approach, as above. Michaelle Birks, MD Vascular and Interventional Radiology  [facility] Radiology Electronically Signed   By: Michaelle Birks M.D.   On: 04/16/2021 15:30    EKG EKG Interpretation  Date/Time:  Sunday April 21 2021 08:47:40 EDT Ventricular Rate:  82 PR Interval:  158 QRS Duration: 97 QT Interval:  359 QTC Calculation: 420 R Axis:   1 Text Interpretation: Sinus rhythm Nonspecific T wave abnormality Baseline wander Confirmed by Lajean Saver [phone]) on 04/21/2021 9:54:33 AM  Radiology CT ABDOMEN PELVIS W CONTRAST  Result Date: 04/21/2021 CLINICAL DATA:  LEFT lower quadrant abdominal pain in a 60 year old male with diagnosis of peritoneal carcinomatosis and  widespread metastatic disease in the setting of prostate neoplasm. EXAM: CT ABDOMEN AND PELVIS WITH CONTRAST TECHNIQUE: Multidetector CT imaging of the abdomen and pelvis was performed using the standard protocol following bolus administration of intravenous contrast. CONTRAST:  130m OMNIPAQUE IOHEXOL 350 MG/ML SOLN COMPARISON:  April 08, 2021. FINDINGS: Lower chest: Interval placement of a PleurX catheter into the RIGHT chest, tip in the medial aspect of the RIGHT costodiaphragmatic sulcus. Diminished RIGHT-sided pleural effusion. Adenopathy in the chest and pulmonary nodules in the chest with little changed compared to very recent imaging. Signs of coronary artery calcification and aortic valvular calcification as before. Hepatobiliary: Extensive hepatic involvement also not substantially changed in the very short interval. No intrahepatic biliary duct distension. Portal vein is patent. No pericholecystic stranding. Pancreas: Normal, without mass, inflammation or ductal dilatation. Spleen: Spleen normal size and contour. Adrenals/Urinary Tract: Adrenal glands are normal. Symmetric renal enhancement with small renal cysts bilaterally. No hydronephrosis. Post prostatectomy with urinary bladder at the pelvic floor as before. Urinary bladder under distended without gross adjacent stranding.  Stomach/Bowel: No sign of bowel obstruction or acute bowel process in the setting of diffuse peritoneal and omental disease. There is increase in ascites compared to previous imaging, bowel floats in this ascites. Appendix not seen but no pericecal inflammation to suggest appendicitis. Vascular/Lymphatic: Atherosclerosis of the abdominal aorta without aneurysmal dilation. Adenopathy in the retroperitoneum and about the porta hepatis and celiac with no substantial change. (Image 39/2) 19 mm periportal lymph node above the pancreatic head previously 18 mm. Bulky portacaval lymph node (image 43/2) 30 mm short axis, unchanged. IVC with smooth contours. Signs of pelvic lymphadenectomy without pelvic mass. Reproductive: Post prostatectomy. Other: Increase in volume of ascites since previous imaging. Omental caking with similar appearance, dominant area 5.8 x 2.9 cm (image 64/2) stable when measured in a similar fashion by this observer on the previous study with other nodules without gross change. No free air. Musculoskeletal: Diffuse skeletal metastatic disease little changed. Pathologic fracture of LEFT pubic bone superior and inferior pubic ramus as before. Rib destruction and pathologic fracture along the RIGHT posterolateral chest involving RIGHT ninth and tenth ribs with similar appearance. Body wall edema may be slightly increased. IMPRESSION: Interval placement of PleurX catheter into the RIGHT chest with diminished pleural effusion as described. Increase in volume of ascites since previous imaging. Otherwise, no significant interval change or acute findings. Unchanged appearance of pathologic fracture of the LEFT pubic bone. Aortic Atherosclerosis (ICD10-I70.0). Electronically Signed   By: GZetta BillsM.D.   On: 04/21/2021 09:55   DG Chest Port 1 View  Result Date: 04/21/2021 CLINICAL DATA:  Abdominal distension, shortness of breath. EXAM: PORTABLE CHEST 1 VIEW COMPARISON:  04/06/2021 FINDINGS: Stable  cardiomediastinal contours. There is a right sided chest tube. No pneumothorax or pleural effusion identified. Atelectasis versus scarring within the left midlung appears unchanged. Hazy opacity within the periphery of the right upper lobe is also unchanged. IMPRESSION: 1. Stable exam. No pneumothorax with right chest tube in place. 2. No change in right upper hazy lung opacity and left midlung scar versus atelectasis. Electronically Signed   By: TKerby MoorsM.D.   On: 04/21/2021 09:48    Procedures Procedures   Medications Ordered in ED Medications  lactated ringers bolus 1,000 mL (has no administration in time range)  lactated ringers bolus 1,000 mL (1,000 mLs Intravenous Incomplete 04/21/21 0931)  calcium gluconate inj 10% (1 g) URGENT USE ONLY! (has no administration in time range)  dextrose 50 %  solution 25 g (has no administration in time range)  sodium bicarbonate injection 50 mEq (has no administration in time range)  lactated ringers bolus 1,000 mL (1,000 mLs Intravenous New Bag/Given 04/21/21 0835)  iohexol (OMNIPAQUE) 350 MG/ML injection 100 mL (100 mLs Intravenous Contrast Given 04/21/21 0854)  HYDROmorphone (DILAUDID) injection 1 mg (1 mg Intravenous Given 04/21/21 0930)  ondansetron (ZOFRAN) injection 4 mg (4 mg Intravenous Given 04/21/21 0930)    ED Course  I have reviewed the triage vital signs and the nursing notes.  Pertinent labs & imaging results that were available during my care of the patient were reviewed by me and considered in my medical decision making (see chart for details).    MDM Rules/Calculators/A&P                          Iv LR bolus as bp soft. Cultures and labs sent. Ecg.   Reviewed nursing notes and prior charts for additional history.   Labs reviewed/interpreted by me - lactate high - felt volume depletion/met ca/lactic acidosis not related to sepsis. LR 30 cc/kg bolus for dehydration. AKI on labs, K high. Cal gluc iv, hc03 iv, d50 iv, LR bolus  - will recheck.   Xrays reviewed/interpreted by me - no pna.   CT Reviewed/interpreted by me - ascites, progressive met ca. No sbo. On recheck exam, no peritonitis/peritoneal signs.   Dilaudid for pain. Zofran for nausea. LR bolus.   Pt/fam request onc and palliative care - both consulted. Will also consulted hospitalist for admission.   CRITICAL CARE RE: metastatic prostate cancer, dehydration/volume depletion w aki, elevated lactate and hyperkalemia Performed by: Mirna Mires Total critical care time: 45 minutes Critical care time was exclusive of separately billable procedures and treating other patients. Critical care was necessary to treat or prevent imminent or life-threatening deterioration. Critical care was time spent personally by me on the following activities: development of treatment plan with patient and/or surrogate as well as nursing, discussions with consultants, evaluation of patient's response to treatment, examination of patient, obtaining history from patient or surrogate, ordering and performing treatments and interventions, ordering and review of laboratory studies, ordering and review of radiographic studies, pulse oximetry and re-evaluation of patient's condition.    Final Clinical Impression(s) / ED Diagnoses Final diagnoses:  Pain    Rx / DC Orders ED Discharge Orders     None            Lajean Saver, MD 04/21/21 1042

## 2021-04-21 NOTE — H&P (Signed)
History and Physical    DARRYLL Bowman PFX:[id number] DOB: 10-04-1960 DOA: 04/21/2021  PCP: Lujean Amel, MD  Patient coming from: Home.  I have personally briefly reviewed patient's old medical records in [place]  Chief Complaint: Abdominal distention, dyspnea and weakness.  HPI: Travis Bowman is a 60 y.o. 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  He also has bilateral lower extremity edema, decreased urination with very concentrated urine.  Denied fever, chills, but stated his appetite is decreased.  No rhinorrhea, sore throat, wheezing or hemoptysis.  No chest pain, palpitations, but had an episode of brief orthopnea in the emergency department after receiving IV fluid boluses.  This resolved quickly with position and then oxygen supplementation.  He has been nausea, but no emesis, diarrhea, melena or hematochezia.  No dysuria, frequency or hematuria.  ED Course: Initial vital signs were temperature 98.7 F, pulse 87, respiration 18, blood pressure 88/55 mmHg O2 sat 94% on room air.  The patient received calcium gluconate, dextrose, sodium bicarbonate and Lokelma due to hyperkalemia.  He received 3000 mL of LR bolus, hydromorphone 1 mg and Zofran 4 mg IVP.  I added albumin 50 g IVPB x1.   Lab work: CBC showed a white count of 12.1, hemoglobin 8.7 g/dL and platelets 262.  Lipase was normal.  Lactic acid 2.1, sodium 126, potassium 5.7, chloride 96 and CO2 19 mmol/L.  Anion gap was normal.  Glucose 113, BUN 83, creatinine 1.34 and  calcium 8.4 g/dL.  Calcium corrects to albumin of 1.9 g/dL.  AST is 104 and alkaline phosphatase 697 units/L.  Procalcitonin was 2.45 ng/mL BNP was normal.  Imaging: CT abdomen did show interval placement of catheter into the right chest with diminished pleural effusion.  There is increasing volume of ascites since previous imaging.  No significant interval change or acute findings.  One-view portable chest radiographs show a chronic right upper hazy lung opacity in left midlung scar versus atelectasis.  Please see images and phonated report for further detail.  Review of Systems: As per HPI otherwise all other systems reviewed and are negative.  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 May 2015--  renal function recovered   Hypertension    Malignant essential hypertension    Prostate cancer ([place]) dx'd 08/2015   Right knee meniscal tear    Type 2 diabetes mellitus ([place])    Wears  contact lenses    Past Surgical History:  Procedure Laterality Date   CATARACT EXTRACTION W/ INTRAOCULAR LENS  IMPLANT, BILATERAL  2003/  2006   EXTRACORPOREAL SHOCK WAVE LITHOTRIPSY  x5  last one 2006   IR PERC PLEURAL DRAIN W/INDWELL CATH W/IMG GUIDE  04/16/2021   KNEE ARTHROSCOPY WITH MEDIAL MENISECTOMY Right 02/08/2015   Procedure: RIGHT KNEE ARTHROSCOPY, PARTIAL MEDIAL MENISCECTOMY, CHRONDROPLASTY;  Surgeon: Sydnee Cabal, MD;  Location: [facility];  Service: Orthopedics;  Laterality: Right;   LYMPHADENECTOMY Bilateral 01/28/2016   Procedure: PELVIC LYMPHADENECTOMY;  Surgeon: Raynelle Bring, MD;  Location: [facility];  Service: Urology;  Laterality: Bilateral;   ORCHIECTOMY Right age 20   PERCUTANEOUS NEPHROSTOLITHOTOMY  x2  last one 2's   PROSTATE BIOPSY     ROBOT ASSISTED LAPAROSCOPIC RADICAL PROSTATECTOMY N/A 01/28/2016   Procedure: XI ROBOTIC ASSISTED LAPAROSCOPIC RADICAL PROSTATECTOMY LEVEL 2;  Surgeon: Raynelle Bring, MD;  Location: [facility];  Service: Urology;  Laterality: N/A;   TONSILLECTOMY AND ADENOIDECTOMY  as child   TRANSTHORACIC ECHOCARDIOGRAM  12-06-2013   mild LVH,  ef 50-55%,  mild LAE and RAE,  mild dilated aortic root   TRIGGER FINGER RELEASE Bilateral last one 2013   x 2   URETEROLITHOTOMY  1990's   Social History  reports that he has been smoking cigars. He has never used smokeless tobacco. He reports current alcohol use of about 2.0 standard drinks per week. He reports that he does not use drugs.  Allergies  Allergen Reactions   Morphine And Related Nausea And Vomiting   Family History  Problem Relation Age of Onset   Stroke Mother    CAD Mother    Alcohol abuse Father    Diabetes Brother    Marfan syndrome Brother    Prior to Admission medications   Medication Sig Start Date End Date Taking? Authorizing Provider  amLODipine (NORVASC) 5 MG tablet Take 5 mg by mouth daily.   Yes [provider]  APIXABAN Arne Cleveland) VTE STARTER PACK ($RemoveBefor'10MG'PFqePNKpBTQh$  AND $Re'5MG'mZO$ ) Take as directed on package: start with two-$RemoveBefore'5mg'hEUDYHImGAHmn$  tablets twice daily for 7 days. On day 8, switch to one-$RemoveBefor'5mg'nggqdVqzIgEU$  tablet twice daily. 04/09/21  Yes Dahal, Marlowe Aschoff, MD  furosemide (LASIX) 40 MG tablet Take 1 tablet (40 mg total) by mouth daily. Patient taking differently: Take 40 mg by mouth 2 (two) times daily. 04/05/21  Yes Shadad, Mathis Dad, MD  leuprolide (LUPRON DEPOT, 23-MONTH,) 30 MG injection Inject 30 mg into the muscle every 4 (four) months.    Yes [provider]  lisinopril (PRINIVIL,ZESTRIL) 40 MG tablet Take 20 mg by mouth every evening. 06/12/14  Yes [provider]  oxyCODONE (OXY IR/ROXICODONE) 5 MG immediate release tablet Take 1 tablet (5 mg total) by mouth every 4 (four) hours as needed for severe pain. 04/17/21  Yes Shadad, Mathis Dad, MD  OYSCO 500 + D 500-200 MG-UNIT TABS TAKE 1 TABLET BY MOUTH TWICE DAILY Patient taking differently: Take 1 tablet by mouth 2 (two) times daily. 08/22/20  Yes Wyatt Portela, MD  polyethylene glycol (MIRALAX) 17 g packet Take 17 g by mouth daily. 04/16/21  Yes Wyatt Portela, MD  senna-docusate (SENNA S) 8.6-50 MG tablet Take 1 tablet by mouth 2 (two) times daily. 04/16/21  Yes Wyatt Portela, MD  tamsulosin (FLOMAX) 0.4 MG CAPS capsule Take 1 capsule (0.4 mg total) by mouth daily. 03/22/21  Yes Patrecia Pour, MD  Blood Glucose Monitoring Suppl (ACCU-CHEK GUIDE) w/Device KIT Use  to test your blood sugar 11/25/18   [provider]  Lancets Micro Thin 33G MISC Use to check your blood sugar 11/25/18   [provider]   Physical Exam: Vitals:   04/21/21 0845 04/21/21 0930 04/21/21 1000 04/21/21 1030  BP:  (!) 101/58 (!) 99/54 108/60  Pulse: 83 84 84 86  Resp:  (!) 27 19 (!) 22  Temp:      TempSrc:      SpO2: 96% 96% 92% 92%  Weight:      Height:       Constitutional: Chronically ill-appearing. Eyes: PERRL, lids and conjunctivae normal ENMT: Mucous membranes are moist. Posterior pharynx clear of any exudate or lesions. Neck: normal, supple, no masses, no thyromegaly Respiratory: clear to auscultation bilaterally, no wheezing, no crackles. Normal respiratory effort. No accessory muscle use.  Cardiovascular: Regular rate and rhythm, no murmurs / rubs / gallops.  4+ bilateral lower extremities pitting edema. 2+ pedal pulses. No carotid bruits.  Abdomen: Distended due to ascites with caput medusae, mild diffuse tenderness.    Musculoskeletal: no clubbing /  cyanosis. Good ROM, no contractures. Normal muscle tone.  Skin: no acute rashes, lesions, ulcers on very limited dermatological examination. Neurologic: CN 2-12 grossly intact. Sensation intact, DTR normal. Strength 5/5 in all 4.  Psychiatric: Normal judgment and insight. Alert and oriented x 3. Normal mood.   Labs on Admission: I have personally reviewed following labs and imaging studies  CBC: Recent Labs  Lab 04/16/21 0739 04/18/21 0943 04/21/21 0809  WBC 13.8* 11.0* 12.1*  NEUTROABS  --  9.3* 10.3*  HGB 8.8* 8.6* 8.7*  HCT 28.7* 27.3* 28.7*  MCV 96.3 95.5 96.0  PLT 300 309 440    Basic Metabolic Panel: Recent Labs  Lab 04/18/21 1030 04/21/21 0809  NA 130* 126*  K 5.3* 5.7*  CL 100 96*  CO2 20* 19*  GLUCOSE 106* 113*  BUN 76* 83*  CREATININE 1.10 1.34*  CALCIUM 8.5* 8.4*    GFR: Estimated Creatinine Clearance: 68.3 mL/min (A) (by C-G formula based on SCr of 1.34 mg/dL (H)).  Liver Function Tests: Recent Labs  Lab 04/21/21 0809  AST 104*  ALT 15  ALKPHOS 697*  BILITOT 0.8  PROT 6.4*  ALBUMIN 1.8*   Radiological Exams on Admission: CT ABDOMEN PELVIS W CONTRAST  Result Date: 04/21/2021 CLINICAL DATA:  LEFT lower quadrant abdominal pain in a 60 year old male with diagnosis of peritoneal carcinomatosis and widespread metastatic disease in the setting of prostate neoplasm. EXAM: CT ABDOMEN AND PELVIS WITH CONTRAST TECHNIQUE: Multidetector CT imaging of the abdomen and pelvis was performed using the standard protocol following bolus administration of intravenous contrast. CONTRAST:  124m OMNIPAQUE IOHEXOL 350 MG/ML SOLN COMPARISON:  April 08, 2021. FINDINGS: Lower chest: Interval placement of a PleurX catheter into the RIGHT chest, tip in the medial aspect of the RIGHT costodiaphragmatic sulcus. Diminished RIGHT-sided pleural effusion. Adenopathy in the chest and pulmonary nodules in the chest with little changed compared to very recent imaging. Signs of coronary  artery calcification and aortic valvular calcification as before. Hepatobiliary: Extensive hepatic involvement also not substantially changed in the very short interval. No intrahepatic biliary duct distension. Portal vein is patent. No pericholecystic stranding. Pancreas: Normal, without mass, inflammation or ductal dilatation. Spleen: Spleen normal size and contour. Adrenals/Urinary Tract: Adrenal glands are normal. Symmetric renal enhancement with small renal cysts bilaterally. No hydronephrosis. Post prostatectomy with urinary bladder at the pelvic floor as before. Urinary bladder under distended without gross adjacent  stranding. Stomach/Bowel: No sign of bowel obstruction or acute bowel process in the setting of diffuse peritoneal and omental disease. There is increase in ascites compared to previous imaging, bowel floats in this ascites. Appendix not seen but no pericecal inflammation to suggest appendicitis. Vascular/Lymphatic: Atherosclerosis of the abdominal aorta without aneurysmal dilation. Adenopathy in the retroperitoneum and about the porta hepatis and celiac with no substantial change. (Image 39/2) 19 mm periportal lymph node above the pancreatic head previously 18 mm. Bulky portacaval lymph node (image 43/2) 30 mm short axis, unchanged. IVC with smooth contours. Signs of pelvic lymphadenectomy without pelvic mass. Reproductive: Post prostatectomy. Other: Increase in volume of ascites since previous imaging. Omental caking with similar appearance, dominant area 5.8 x 2.9 cm (image 64/2) stable when measured in a similar fashion by this observer on the previous study with other nodules without gross change. No free air. Musculoskeletal: Diffuse skeletal metastatic disease little changed. Pathologic fracture of LEFT pubic bone superior and inferior pubic ramus as before. Rib destruction and pathologic fracture along the RIGHT posterolateral chest involving RIGHT ninth and tenth ribs with similar  appearance. Body wall edema may be slightly increased. IMPRESSION: Interval placement of PleurX catheter into the RIGHT chest with diminished pleural effusion as described. Increase in volume of ascites since previous imaging. Otherwise, no significant interval change or acute findings. Unchanged appearance of pathologic fracture of the LEFT pubic bone. Aortic Atherosclerosis (ICD10-I70.0). Electronically Signed   By: Zetta Bills M.D.   On: 04/21/2021 09:55   DG Chest Port 1 View  Result Date: 04/21/2021 CLINICAL DATA:  Abdominal distension, shortness of breath. EXAM: PORTABLE CHEST 1 VIEW COMPARISON:  04/06/2021 FINDINGS: Stable cardiomediastinal contours. There is a right sided chest tube. No pneumothorax or pleural effusion identified. Atelectasis versus scarring within the left midlung appears unchanged. Hazy opacity within the periphery of the right upper lobe is also unchanged. IMPRESSION: 1. Stable exam. No pneumothorax with right chest tube in place. 2. No change in right upper hazy lung opacity and left midlung scar versus atelectasis. Electronically Signed   By: Kerby Moors M.D.   On: 04/21/2021 09:48    EKG: Independently reviewed.  Vent. rate 82 BPM PR interval 158 ms QRS duration 97 ms QT/QTcB 359/420 ms P-R-T axes 56 1 91 Sinus rhythm Nonspecific T wave abnormality Baseline wander  Assessment/Plan Principal Problem:   AKI (acute kidney injury) ([place]) Observation/PCU. Was vigorously hydrated. Albumin 50 g given earlier. Avoid hypotension. Continue time-limited IV fluids. Avoid nephrotoxic medications. Monitor intake and output. Follow-up renal function electrolytes in AM.  Active Problems:   Hypotension Responded to albumin/IVF.    Lactic acidosis Still awaiting for follow-up level. Will reorder.    Hyponatremia Due to hypervolemic state. Follow-up sodium level in AM.    Malignant ascites Received albumin earlier. IR will perform paracentesis. Will  premedicate with albumin prior to procedure. Begin ceftriaxone 2 g IVPB every 24 hours to cover SBP.    Malignant neoplasm of prostate [facility]) Further chemotherapy to be discussed with oncology.    Severe protein-calorie malnutrition (HCC) IV albumin. Protein or protein supplementation as tolerated. Consider nutritional services evaluation.    Type 2 diabetes mellitus (HCC) Carbohydrate modified diet. Check CBG before meals and bedtime.    GERD (gastroesophageal reflux disease) Begin pantoprazole 40 mg p.o. daily.    Normocytic anemia Monitor hematocrit and hemoglobin. Transfuse as needed.    Hyperkalemia Treated in ED. Received 3000 mL of LR bolus. Dextrose, sodium bicarb, calcium gluconate. Lokelma 5  g p.o. x1 was given.  DVT prophylaxis: On apixaban. Code Status:   Full code. Family Communication:  His wife was at bedside. Disposition Plan:   Patient is from:  Home.  Anticipated DC to:  Home.  Anticipated DC date:  04/21/2021 or 04/22/2021.  Anticipated DC barriers: Clinical status.  Consults called:  Interventional radiology Ruthann Cancer, MD). Admission status:  Observation/PCU.  Severity of Illness:  High severity after presenting with new onset apicitis with anasarca with AKI in the setting of metastatic to bones, lungs and liver prostate cancer.  The patient will remain for IV antibiotic therapy.  Reubin Milan MD [facility]  How to contact the [facility] Attending or Consulting provider [place] or covering provider during after hours [place], for this patient?   Check the care team in [facility] and look for a) attending/consulting [facility] provider listed and b) the [facility] team listed Log into [email or web address] and use [place]'s universal password to access. If you do not have the password, please contact the hospital operator. Locate the [facility] provider you are looking for under [facility] and page to a number that you can be directly reached. If you still have  difficulty reaching the provider, please page the [facility] (Director on Call) for the Hospitalists listed on amion for assistance.  04/21/2021, 11:04 AM   This document was prepared using Dragon voice recognition software and may contain some unintended transcription errors.

## 2021-04-21 NOTE — Plan of Care (Signed)
Plan of care discussed with Patient and Travis Bowman.

## 2021-04-22 ENCOUNTER — Observation Stay (HOSPITAL_COMMUNITY): Payer: 59

## 2021-04-22 ENCOUNTER — Inpatient Hospital Stay (HOSPITAL_COMMUNITY): Payer: 59

## 2021-04-22 DIAGNOSIS — C7800 Secondary malignant neoplasm of unspecified lung: Secondary | ICD-10-CM | POA: Diagnosis present

## 2021-04-22 DIAGNOSIS — E860 Dehydration: Secondary | ICD-10-CM

## 2021-04-22 DIAGNOSIS — I959 Hypotension, unspecified: Secondary | ICD-10-CM | POA: Diagnosis present

## 2021-04-22 DIAGNOSIS — N179 Acute kidney failure, unspecified: Principal | ICD-10-CM

## 2021-04-22 DIAGNOSIS — Z515 Encounter for palliative care: Secondary | ICD-10-CM | POA: Diagnosis not present

## 2021-04-22 DIAGNOSIS — L8990 Pressure ulcer of unspecified site, unspecified stage: Secondary | ICD-10-CM | POA: Insufficient documentation

## 2021-04-22 DIAGNOSIS — C61 Malignant neoplasm of prostate: Secondary | ICD-10-CM | POA: Diagnosis present

## 2021-04-22 DIAGNOSIS — Z20822 Contact with and (suspected) exposure to covid-19: Secondary | ICD-10-CM | POA: Diagnosis present

## 2021-04-22 DIAGNOSIS — D630 Anemia in neoplastic disease: Secondary | ICD-10-CM | POA: Diagnosis present

## 2021-04-22 DIAGNOSIS — I110 Hypertensive heart disease with heart failure: Secondary | ICD-10-CM | POA: Diagnosis present

## 2021-04-22 DIAGNOSIS — E119 Type 2 diabetes mellitus without complications: Secondary | ICD-10-CM | POA: Diagnosis present

## 2021-04-22 DIAGNOSIS — E43 Unspecified severe protein-calorie malnutrition: Secondary | ICD-10-CM | POA: Diagnosis present

## 2021-04-22 DIAGNOSIS — R180 Malignant ascites: Secondary | ICD-10-CM

## 2021-04-22 DIAGNOSIS — Z7189 Other specified counseling: Secondary | ICD-10-CM | POA: Diagnosis not present

## 2021-04-22 DIAGNOSIS — E875 Hyperkalemia: Secondary | ICD-10-CM | POA: Diagnosis present

## 2021-04-22 DIAGNOSIS — E669 Obesity, unspecified: Secondary | ICD-10-CM | POA: Diagnosis present

## 2021-04-22 DIAGNOSIS — Z86711 Personal history of pulmonary embolism: Secondary | ICD-10-CM | POA: Diagnosis not present

## 2021-04-22 DIAGNOSIS — Z66 Do not resuscitate: Secondary | ICD-10-CM | POA: Diagnosis present

## 2021-04-22 DIAGNOSIS — E872 Acidosis, unspecified: Secondary | ICD-10-CM | POA: Diagnosis present

## 2021-04-22 DIAGNOSIS — I509 Heart failure, unspecified: Secondary | ICD-10-CM | POA: Diagnosis present

## 2021-04-22 DIAGNOSIS — Z6833 Body mass index (BMI) 33.0-33.9, adult: Secondary | ICD-10-CM | POA: Diagnosis not present

## 2021-04-22 DIAGNOSIS — C7951 Secondary malignant neoplasm of bone: Secondary | ICD-10-CM | POA: Diagnosis present

## 2021-04-22 DIAGNOSIS — Z885 Allergy status to narcotic agent status: Secondary | ICD-10-CM | POA: Diagnosis not present

## 2021-04-22 DIAGNOSIS — E871 Hypo-osmolality and hyponatremia: Secondary | ICD-10-CM | POA: Diagnosis present

## 2021-04-22 DIAGNOSIS — F17290 Nicotine dependence, other tobacco product, uncomplicated: Secondary | ICD-10-CM | POA: Diagnosis present

## 2021-04-22 DIAGNOSIS — L89151 Pressure ulcer of sacral region, stage 1: Secondary | ICD-10-CM | POA: Diagnosis present

## 2021-04-22 DIAGNOSIS — K219 Gastro-esophageal reflux disease without esophagitis: Secondary | ICD-10-CM | POA: Diagnosis present

## 2021-04-22 LAB — URINALYSIS, ROUTINE W REFLEX MICROSCOPIC
Bacteria, UA: NONE SEEN
Bilirubin Urine: NEGATIVE
Glucose, UA: NEGATIVE mg/dL
Hgb urine dipstick: NEGATIVE
Ketones, ur: NEGATIVE mg/dL
Leukocytes,Ua: NEGATIVE
Nitrite: NEGATIVE
Protein, ur: 30 mg/dL — AB
Specific Gravity, Urine: 1.032 — ABNORMAL HIGH (ref 1.005–1.030)
pH: 5 (ref 5.0–8.0)

## 2021-04-22 LAB — COMPREHENSIVE METABOLIC PANEL
ALT: 13 U/L (ref 0–44)
AST: 100 U/L — ABNORMAL HIGH (ref 15–41)
Albumin: 2.3 g/dL — ABNORMAL LOW (ref 3.5–5.0)
Alkaline Phosphatase: 524 U/L — ABNORMAL HIGH (ref 38–126)
Anion gap: 13 (ref 5–15)
BUN: 84 mg/dL — ABNORMAL HIGH (ref 6–20)
CO2: 21 mmol/L — ABNORMAL LOW (ref 22–32)
Calcium: 8.5 mg/dL — ABNORMAL LOW (ref 8.9–10.3)
Chloride: 95 mmol/L — ABNORMAL LOW (ref 98–111)
Creatinine, Ser: 1.49 mg/dL — ABNORMAL HIGH (ref 0.61–1.24)
GFR, Estimated: 54 mL/min — ABNORMAL LOW (ref >60)
Glucose, Bld: 101 mg/dL — ABNORMAL HIGH (ref 70–99)
Potassium: 5.6 mmol/L — ABNORMAL HIGH (ref 3.5–5.1)
Sodium: 129 mmol/L — ABNORMAL LOW (ref 135–145)
Total Bilirubin: 0.7 mg/dL (ref 0.3–1.2)
Total Protein: 6.7 g/dL (ref 6.5–8.1)

## 2021-04-22 LAB — LACTATE DEHYDROGENASE, PLEURAL OR PERITONEAL FLUID: LD, Fluid: 543 U/L — ABNORMAL HIGH (ref 3–23)

## 2021-04-22 LAB — CBC
HCT: 28.3 % — ABNORMAL LOW (ref 39.0–52.0)
Hemoglobin: 8.5 g/dL — ABNORMAL LOW (ref 13.0–17.0)
MCH: 29.6 pg (ref 26.0–34.0)
MCHC: 30 g/dL (ref 30.0–36.0)
MCV: 98.6 fL (ref 80.0–100.0)
Platelets: 246 10*3/uL (ref 150–400)
RBC: 2.87 MIL/uL — ABNORMAL LOW (ref 4.22–5.81)
RDW: 19.7 % — ABNORMAL HIGH (ref 11.5–15.5)
WBC: 12.2 10*3/uL — ABNORMAL HIGH (ref 4.0–10.5)
nRBC: 0 % (ref 0.0–0.2)

## 2021-04-22 LAB — LACTIC ACID, PLASMA: Lactic Acid, Venous: 2.7 mmol/L (ref 0.5–1.9)

## 2021-04-22 LAB — BODY FLUID CELL COUNT WITH DIFFERENTIAL
Eos, Fluid: 0 %
Lymphs, Fluid: 30 %
Monocyte-Macrophage-Serous Fluid: 55 % (ref 50–90)
Neutrophil Count, Fluid: 15 % (ref 0–25)
Total Nucleated Cell Count, Fluid: 200 cu mm (ref 0–1000)

## 2021-04-22 LAB — GLUCOSE, CAPILLARY
Glucose-Capillary: 113 mg/dL — ABNORMAL HIGH (ref 70–99)
Glucose-Capillary: 97 mg/dL (ref 70–99)
Glucose-Capillary: 92 mg/dL (ref 70–99)
Glucose-Capillary: 92 mg/dL (ref 70–99)

## 2021-04-22 LAB — PROTEIN, PLEURAL OR PERITONEAL FLUID: Total protein, fluid: <3 g/dL

## 2021-04-22 LAB — ALBUMIN, PLEURAL OR PERITONEAL FLUID: Albumin, Fluid: <1.5 g/dL

## 2021-04-22 LAB — PROCALCITONIN: Procalcitonin: 3.95 ng/mL

## 2021-04-22 LAB — GLUCOSE, PLEURAL OR PERITONEAL FLUID: Glucose, Fluid: 106 mg/dL

## 2021-04-22 MED ORDER — MIDODRINE HCL 5 MG PO TABS
5.0000 mg | ORAL_TABLET | Freq: Three times a day (TID) | ORAL | Status: DC
  Administered 2021-04-22 – 2021-04-24 (×6): 5 mg via ORAL
  Filled 2021-04-22 (×6): qty 1

## 2021-04-22 MED ORDER — MIDODRINE HCL 5 MG PO TABS: 5.0000 mg | ORAL_TABLET | Freq: Three times a day (TID) | ORAL | Status: DC

## 2021-04-22 MED ORDER — PANTOPRAZOLE SODIUM 40 MG IV SOLR
40.0000 mg | Freq: Two times a day (BID) | INTRAVENOUS | Status: DC
  Administered 2021-04-22 – 2021-04-24 (×5): 40 mg via INTRAVENOUS
  Filled 2021-04-22 (×5): qty 40

## 2021-04-22 MED ORDER — LIDOCAINE HCL 1 % IJ SOLN
INTRAMUSCULAR | Status: AC
  Filled 2021-04-22: qty 20

## 2021-04-22 MED ORDER — ORAL CARE MOUTH RINSE
15.0000 mL | Freq: Two times a day (BID) | OROMUCOSAL | Status: DC
  Administered 2021-04-22 – 2021-04-24 (×4): 15 mL via OROMUCOSAL

## 2021-04-22 MED ORDER — DICLOFENAC SODIUM 1 % EX GEL
2.0000 g | Freq: Four times a day (QID) | CUTANEOUS | Status: DC
  Administered 2021-04-22 – 2021-04-23 (×4): 2 g via TOPICAL
  Filled 2021-04-22: qty 100

## 2021-04-22 MED ORDER — APIXABAN 5 MG PO TABS
5.0000 mg | ORAL_TABLET | Freq: Two times a day (BID) | ORAL | Status: DC
  Administered 2021-04-22 – 2021-04-24 (×4): 5 mg via ORAL
  Filled 2021-04-22 (×4): qty 1

## 2021-04-22 MED ORDER — CALCIUM CARBONATE ANTACID 500 MG PO CHEW
1.0000 | CHEWABLE_TABLET | Freq: Three times a day (TID) | ORAL | Status: DC | PRN
  Administered 2021-04-22: 200 mg via ORAL
  Filled 2021-04-22: qty 1

## 2021-04-22 NOTE — Progress Notes (Signed)
IP PROGRESS NOTE  Subjective:   Events last few weeks noted.  [name] continues to struggle at this time with overall quality of life and declining performance status.  He had increased abdominal distention and lower extremity edema related to malignant ascites and effusion.  He is chair and mostly bedbound at this time.  It is not reported pain but feels overall uncomfortable.  Objective:  Vital signs in last 24 hours: Temp:  [97.6 F (36.4 C)-98.4 F (36.9 C)] 98.4 F (36.9 C) (10/24 0304) Pulse Rate:  [83-99] 99 (10/24 0304) Resp:  [16-27] 16 (10/24 0304) BP: (87-108)/(52-64) 97/61 (10/24 0304) SpO2:  [90 %-98 %] 90 % (10/24 0304) Weight change:  Last BM Date: 04/21/21  Intake/Output from previous day: 10/23 0701 - 10/24 0700 In: 3930.1 [P.O.:410; I.V.:440.1; IV Piggyback:3080] Out: 100 [Urine:100] General: Ill-appearing gentleman without distress. Head: Normocephalic atraumatic. Mouth: mucous membranes moist, pharynx normal without lesions Eyes: No scleral icterus.  Pupils are equal and round reactive to light. Resp: clear to auscultation bilaterally without rhonchi or wheezes or dullness to percussion. Cardio: regular rate and rhythm, S1, S2 normal, no murmur, click, rub or gallop GI: Distended without any rebound or guarding. Musculoskeletal: No joint deformity or effusion. Neurological: No motor, sensory deficits.  Intact deep tendon reflexes. Skin: No rashes or lesions.  Portacath without erythema  Lab Results: Recent Labs    04/21/21 0809 04/21/21 0816 04/22/21 0336  WBC 12.1*  --  12.2*  HGB 8.7* 8.8* 8.5*  HCT 28.7* 26.0* 28.3*  PLT 262  --  246    BMET Recent Labs    04/21/21 0809 04/21/21 0816 04/22/21 0336  NA 126* 126* 129*  K 5.7* 5.7* 5.6*  CL 96* 100 95*  CO2 19*  --  21*  GLUCOSE 113* 109* 101*  BUN 83* 91* 84*  CREATININE 1.34* 1.60* 1.49*  CALCIUM 8.4*  --  8.5*    Studies/Results: DG Chest 1 View  Result Date:  04/22/2021 CLINICAL DATA:  Dyspnea. EXAM: CHEST  1 VIEW COMPARISON:  Radiograph yesterday. FINDINGS: Persistent low lung volumes. Patchy and nodular bilateral airspace opacities are similar to prior exam. Right chest tube projects over the right lung base. Small right pleural effusion. Stable heart size and mediastinal contours. No pneumothorax. IMPRESSION: 1. Stable radiographic appearance of the chest. Low lung volumes. Unchanged patchy and nodular bilateral airspace opacities. 2. Small right pleural effusion. 3. Right chest tube in place. No pneumothorax. Electronically Signed   By: Keith Rake M.D.   On: 04/22/2021 02:02   CT ABDOMEN PELVIS W CONTRAST  Result Date: 04/21/2021 CLINICAL DATA:  LEFT lower quadrant abdominal pain in a 60 year old male with diagnosis of peritoneal carcinomatosis and widespread metastatic disease in the setting of prostate neoplasm. EXAM: CT ABDOMEN AND PELVIS WITH CONTRAST TECHNIQUE: Multidetector CT imaging of the abdomen and pelvis was performed using the standard protocol following bolus administration of intravenous contrast. CONTRAST:  169mL OMNIPAQUE IOHEXOL 350 MG/ML SOLN COMPARISON:  April 08, 2021. FINDINGS: Lower chest: Interval placement of a PleurX catheter into the RIGHT chest, tip in the medial aspect of the RIGHT costodiaphragmatic sulcus. Diminished RIGHT-sided pleural effusion. Adenopathy in the chest and pulmonary nodules in the chest with little changed compared to very recent imaging. Signs of coronary artery calcification and aortic valvular calcification as before. Hepatobiliary: Extensive hepatic involvement also not substantially changed in the very short interval. No intrahepatic biliary duct distension. Portal vein is patent. No pericholecystic stranding. Pancreas: Normal, without mass,  inflammation or ductal dilatation. Spleen: Spleen normal size and contour. Adrenals/Urinary Tract: Adrenal glands are normal. Symmetric renal enhancement with  small renal cysts bilaterally. No hydronephrosis. Post prostatectomy with urinary bladder at the pelvic floor as before. Urinary bladder under distended without gross adjacent stranding. Stomach/Bowel: No sign of bowel obstruction or acute bowel process in the setting of diffuse peritoneal and omental disease. There is increase in ascites compared to previous imaging, bowel floats in this ascites. Appendix not seen but no pericecal inflammation to suggest appendicitis. Vascular/Lymphatic: Atherosclerosis of the abdominal aorta without aneurysmal dilation. Adenopathy in the retroperitoneum and about the porta hepatis and celiac with no substantial change. (Image 39/2) 19 mm periportal lymph node above the pancreatic head previously 18 mm. Bulky portacaval lymph node (image 43/2) 30 mm short axis, unchanged. IVC with smooth contours. Signs of pelvic lymphadenectomy without pelvic mass. Reproductive: Post prostatectomy. Other: Increase in volume of ascites since previous imaging. Omental caking with similar appearance, dominant area 5.8 x 2.9 cm (image 64/2) stable when measured in a similar fashion by this observer on the previous study with other nodules without gross change. No free air. Musculoskeletal: Diffuse skeletal metastatic disease little changed. Pathologic fracture of LEFT pubic bone superior and inferior pubic ramus as before. Rib destruction and pathologic fracture along the RIGHT posterolateral chest involving RIGHT ninth and tenth ribs with similar appearance. Body wall edema may be slightly increased. IMPRESSION: Interval placement of PleurX catheter into the RIGHT chest with diminished pleural effusion as described. Increase in volume of ascites since previous imaging. Otherwise, no significant interval change or acute findings. Unchanged appearance of pathologic fracture of the LEFT pubic bone. Aortic Atherosclerosis (ICD10-I70.0). Electronically Signed   By: Zetta Bills M.D.   On: 04/21/2021  09:55   DG CHEST PORT 1 VIEW  Result Date: 04/21/2021 CLINICAL DATA:  Tachycardia, orthopnea, and hypoxia after receiving fluids. History of type 2 diabetes and CHF. EXAM: PORTABLE CHEST 1 VIEW COMPARISON:  04/21/2021 FINDINGS: Low lung volumes. Stable elevation of the RIGHT hemidiaphragm. Interval increased in mildly prominent interstitial markings. More focal opacity in the LEFT lung base and RIGHT lung apex are again noted, consistent with infectious infiltrates. IMPRESSION: Findings consistent with interstitial edema and persistent bilateral pulmonary infiltrates. Electronically Signed   By: Nolon Nations M.D.   On: 04/21/2021 13:09   DG Chest Port 1 View  Result Date: 04/21/2021 CLINICAL DATA:  Abdominal distension, shortness of breath. EXAM: PORTABLE CHEST 1 VIEW COMPARISON:  04/06/2021 FINDINGS: Stable cardiomediastinal contours. There is a right sided chest tube. No pneumothorax or pleural effusion identified. Atelectasis versus scarring within the left midlung appears unchanged. Hazy opacity within the periphery of the right upper lobe is also unchanged. IMPRESSION: 1. Stable exam. No pneumothorax with right chest tube in place. 2. No change in right upper hazy lung opacity and left midlung scar versus atelectasis. Electronically Signed   By: Kerby Moors M.D.   On: 04/21/2021 09:48    Medications: I have reviewed the patient's current medications.  Assessment/Plan:  60 year old with:  1.  Advanced prostate cancer with metastatic disease to the bone and lymphadenopathy.  He has progressed on uncomfortable therapies and received 1 cycle of pluvecto.   The natural course of this disease at this time was discussed and treatment choices were reviewed.  He is facing along lots of recovery at this time and his performance status is declining.  After discussion today, he opted to discontinue all cancer treatment and proceed with hospice  care.  This is certainly a reasonable decision and I  support the decision to discontinue all cancer treatment and focus on supportive care.  2.  Malignant ascites: I am in favor of paracentesis for therapeutic purposes.  No diagnostic work-up is needed.  3.  Prognosis and goals of care: His prognosis is poor with limited life expectancy.  We have discussed changing his CODE STATUS to no CODE BLUE which she agrees.  I anticipate that he has limited life expectancy and would be a candidate for hospice and potentially residential hospice.  I appreciate input from palliative medicine services to help with his symptom management as well as facilitate potential residential hospice disposition.  All questions were answered today in detail with the patient, his wife and son are present at bedside.  35  minutes were dedicated to this visit. The time was spent on reviewing laboratory data, imaging studies, discussing treatment options, and answering questions regarding diagnosis, prognosis and life expectancy.    LOS: 0 days   Zola Button 04/22/2021, 8:02 AM

## 2021-04-22 NOTE — Progress Notes (Signed)
PROGRESS NOTE    Travis Bowman  YVO:[id number] DOB: [birth year] DOA: 04/21/2021 PCP: Lujean Amel, MD    Brief Narrative:  60 y.o. 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  Patient was found to have increasing volume of ascites with concerns for progression of metastatic prostate cancer.  Assessment & Plan:   Principal Problem:   AKI (acute kidney injury) ([place]) Active Problems:   Type 2 diabetes mellitus (HCC)   GERD (gastroesophageal reflux disease)   Malignant neoplasm of prostate (HCC)   Hypotension   Severe protein-calorie malnutrition (HCC)   Normocytic anemia   Hyperkalemia   Lactic acidosis   Hyponatremia   Ascites, malignant    Hypotension -Presenting with cholecystitis with systolic blood pressure of 88, reportedly improved with IV fluids with albumin. -Blood pressure stable thus far -Continue to hold Norvasc, Lasix, lisinopril per home regimen     Lactic acidosis -Lactic acid down to 2.7 this morning from 3.1 at time of admission -Patient otherwise hemodynamically stable     Hyponatremia -Presenting sodium of 126 presumed secondary to volume overload -Repeat sodium this morning up to 129, patient otherwise appears stable -Gross volume overload noted on exam with lower extremity edema -Once blood pressure improves, would consider trial of diuresis     Malignant ascites Received albumin earlier. Pt is now s/p IR guided paracentesis yielding 5.6L fluid -Fluid analysis reviewed. Absolute neutrophils of 30,  rules out SBP by fluid analysis -fluid culture is pending -Pt is on ceftriaxone 2 g IVPB every 24 hours empirically     Malignant neoplasm of prostate ([place]) -Appreciate input by Oncology -Difficult situation where pt has not tolerated chemo and continues to decline -Recommendations for hospice noted with Palliative Care consulted     Severe protein-calorie malnutrition ([place]) -Protein or protein supplementation as tolerated. -Cont to encourage PO intake as tolerated     Type 2 diabetes mellitus (HCC) -Carbohydrate modified diet. -Cont SSI coverage as needed -Glycemic trends reviewed, stable     GERD (gastroesophageal reflux disease) -Begin pantoprazole 40 mg p.o. daily. -N/V this AM with pt reporting worse GERD symptoms, likely secondary to large volume ascites prior to paracentesis with bloody appearing emesis -held eliquis for now -Have increased PPI to 40mg  IV BID     Normocytic anemia Monitor hematocrit and hemoglobin. Transfuse as needed. -Pt with n/v this AM with reported dark, bloody appearing emesis -Hemodynamically stable and no further episodes -For now, holding eliquis -Increased protonix per above     Hyperkalemia -Replaced -Repeat bmet in AM  Hx PE -Recent admit for PE noted -Eliquis briefly on hold given concerns of possible hematemesis -If stable later after paracentesis, would plan to resume eliquis and monitor closely   DVT prophylaxis: Eliquis Code Status: DNR Family Communication: Pt in room, family at bedside  Status is: Observation  The patient will require care spanning > 2 midnights and should be moved to inpatient because: severity of illness   Consultants:  Oncology Palliative Care IR  Procedures:  IR guided paracentesis 10/24  Antimicrobials: Anti-infectives (From admission, onward)    Start     Dose/Rate Route  Frequency Ordered Stop   04/21/21 1600  cefTRIAXone (ROCEPHIN) 2 g in sodium chloride 0.9 % 100 mL IVPB       Note to  Pharmacy: SBP.   2 g 200 mL/hr over 30 Minutes Intravenous Every 24 hours 04/21/21 1552         Subjective: Patient seen before paracentesis.  Reports abdominal discomfort and tightness.  Also reports nausea with increased GERD symptoms.  Patient did have 1 episode of vomiting earlier with dark-colored emesis noted.  Objective: Vitals:   04/22/21 1335 04/22/21 1341 04/22/21 1348 04/22/21 1353  BP: (!) 99/59 (!) 94/58 (!) 97/57 (!) 98/59  Pulse:      Resp:      Temp:      TempSrc:      SpO2:      Weight:      Height:        Intake/Output Summary (Last 24 hours) at 04/22/2021 1542 Last data filed at 04/22/2021 1135 Gross per 24 hour  Intake 950.1 ml  Output 250 ml  Net 700.1 ml   Filed Weights   04/21/21 0708  Weight: 100.7 kg    Examination: General exam: Awake, laying in bed, in nad Respiratory system: Normal respiratory effort, no wheezing Cardiovascular system: regular rate, s1, s2 Gastrointestinal system: distended, positive BS Central nervous system: CN2-12 grossly intact, strength intact Extremities: Perfused, BLE edema Skin: Normal skin turgor, no notable skin lesions seen Psychiatry: Mood normal // no visual hallucinations   Data Reviewed: I have personally reviewed following labs and imaging studies  CBC: Recent Labs  Lab 04/16/21 0739 04/18/21 0943 04/21/21 0809 04/21/21 0816 04/22/21 0336  WBC 13.8* 11.0* 12.1*  --  12.2*  NEUTROABS  --  9.3* 10.3*  --   --   HGB 8.8* 8.6* 8.7* 8.8* 8.5*  HCT 28.7* 27.3* 28.7* 26.0* 28.3*  MCV 96.3 95.5 96.0  --  98.6  PLT 300 309 262  --  790   Basic Metabolic Panel: Recent Labs  Lab 04/18/21 1030 04/21/21 0809 04/21/21 0816 04/22/21 0336  NA 130* 126* 126* 129*  K 5.3* 5.7* 5.7* 5.6*  CL 100 96* 100 95*  CO2 20* 19*  --  21*  GLUCOSE 106* 113* 109* 101*  BUN 76* 83* 91* 84*  CREATININE 1.10 1.34* 1.60* 1.49*  CALCIUM 8.5* 8.4*  --  8.5*   GFR: Estimated Creatinine Clearance: 61.4 mL/min (A)  (by C-G formula based on SCr of 1.49 mg/dL (H)). Liver Function Tests: Recent Labs  Lab 04/21/21 0809 04/22/21 0336  AST 104* 100*  ALT 15 13  ALKPHOS 697* 524*  BILITOT 0.8 0.7  PROT 6.4* 6.7  ALBUMIN 1.8* 2.3*   Recent Labs  Lab 04/21/21 0809  LIPASE 23   No results for input(s): AMMONIA in the last 168 hours. Coagulation Profile: Recent Labs  Lab 04/16/21 0739  INR 2.7*   Cardiac Enzymes: No results for input(s): CKTOTAL, CKMB, CKMBINDEX, TROPONINI in the last 168 hours. BNP (last 3 results) No results for input(s): PROBNP in the last 8760 hours. HbA1C: No results for input(s): HGBA1C in the last 72 hours. CBG: Recent Labs  Lab 04/16/21 0723 04/21/21 1953 04/22/21 0758 04/22/21 1140  GLUCAP 111* 96 113* 97   Lipid Profile: No results for input(s): CHOL, HDL, LDLCALC, TRIG, CHOLHDL, LDLDIRECT in the last 72 hours. Thyroid Function Tests: No results for input(s): TSH, T4TOTAL, FREET4, T3FREE, THYROIDAB in the last 72 hours. Anemia Panel: No results for input(s): VITAMINB12, FOLATE,  FERRITIN, TIBC, IRON, RETICCTPCT in the last 72 hours. Sepsis Labs: Recent Labs  Lab 04/21/21 0809 04/22/21 0336  PROCALCITON 2.45 3.95  LATICACIDVEN 3.1* 2.7*    Recent Results (from the past 240 hour(s))  Resp Panel by RT-PCR (Flu A&B, Covid) Nasopharyngeal Swab     Status: None   Collection Time: 04/21/21  8:09 AM   Specimen: Nasopharyngeal Swab; Nasopharyngeal(NP) swabs in vial transport medium  Result Value Ref Range Status   SARS Coronavirus 2 by RT PCR NEGATIVE NEGATIVE Final    Comment: (NOTE) SARS-CoV-2 target nucleic acids are NOT DETECTED.  The SARS-CoV-2 RNA is generally detectable in upper respiratory specimens during the acute phase of infection. The lowest concentration of SARS-CoV-2 viral copies this assay can detect is 138 copies/mL. A negative result does not preclude SARS-Cov-2 infection and should not be used as the sole basis for treatment or other  patient management decisions. A negative result may occur with  improper specimen collection/handling, submission of specimen other than nasopharyngeal swab, presence of viral mutation(s) within the areas targeted by this assay, and inadequate number of viral copies(<138 copies/mL). A negative result must be combined with clinical observations, patient history, and epidemiological information. The expected result is Negative.  Fact Sheet for Patients:  EntrepreneurPulse.com.au  Fact Sheet for Healthcare Providers:  IncredibleEmployment.be  This test is no t yet approved or cleared by the [place] FDA and  has been authorized for detection and/or diagnosis of SARS-CoV-2 by FDA under an Emergency Use Authorization (EUA). This EUA will remain  in effect (meaning this test can be used) for the duration of the COVID-19 declaration under Section 564(b)(1) of the Act, 21 U.S.C.section 360bbb-3(b)(1), unless the authorization is terminated  or revoked sooner.       Influenza A by PCR NEGATIVE NEGATIVE Final   Influenza B by PCR NEGATIVE NEGATIVE Final    Comment: (NOTE) The Xpert Xpress SARS-CoV-2/FLU/RSV plus assay is intended as an aid in the diagnosis of influenza from Nasopharyngeal swab specimens and should not be used as a sole basis for treatment. Nasal washings and aspirates are unacceptable for Xpert Xpress SARS-CoV-2/FLU/RSV testing.  Fact Sheet for Patients: EntrepreneurPulse.com.au  Fact Sheet for Healthcare Providers: IncredibleEmployment.be  This test is not yet approved or cleared by the [place] FDA and has been authorized for detection and/or diagnosis of SARS-CoV-2 by FDA under an Emergency Use Authorization (EUA). This EUA will remain in effect (meaning this test can be used) for the duration of the COVID-19 declaration under Section 564(b)(1) of the Act, 21 U.S.C. section  360bbb-3(b)(1), unless the authorization is terminated or revoked.  Performed at [facility], [place] [address], [place], [place] [address]   Blood culture (routine x 2)     Status: None (Preliminary result)   Collection Time: 04/21/21  8:09 AM   Specimen: BLOOD  Result Value Ref Range Status   Specimen Description   Final    BLOOD RIGHT ANTECUBITAL Performed at [facility] [address], [place], [place] [address]    Special Requests   Final    BOTTLES DRAWN AEROBIC AND ANAEROBIC Blood Culture adequate volume Performed at [facility] [address], [place], [place] [address]    Culture   Final    NO GROWTH < 24 HOURS Performed at [facility] [address] [place], [place] [address]    Report Status PENDING  Incomplete     Radiology Studies: DG Chest 1  View  Result Date: 04/22/2021 CLINICAL DATA:  Dyspnea. EXAM: CHEST  1 VIEW COMPARISON:  Radiograph yesterday. FINDINGS: Persistent low lung volumes. Patchy and nodular bilateral airspace opacities are similar to prior exam. Right chest tube projects over the right lung base. Small right pleural effusion. Stable heart size and mediastinal contours. No pneumothorax. IMPRESSION: 1. Stable radiographic appearance of the chest. Low lung volumes. Unchanged patchy and nodular bilateral airspace opacities. 2. Small right pleural effusion. 3. Right chest tube in place. No pneumothorax. Electronically Signed   By: Keith Rake M.D.   On: 04/22/2021 02:02   CT ABDOMEN PELVIS W CONTRAST  Result Date: 04/21/2021 CLINICAL DATA:  LEFT lower quadrant abdominal pain in a 60 year old male with diagnosis of peritoneal carcinomatosis and widespread metastatic disease in the setting of prostate neoplasm. EXAM: CT ABDOMEN AND PELVIS WITH CONTRAST TECHNIQUE: Multidetector CT imaging of the abdomen and pelvis was performed using the standard protocol following bolus administration of  intravenous contrast. CONTRAST:  179mL OMNIPAQUE IOHEXOL 350 MG/ML SOLN COMPARISON:  April 08, 2021. FINDINGS: Lower chest: Interval placement of a PleurX catheter into the RIGHT chest, tip in the medial aspect of the RIGHT costodiaphragmatic sulcus. Diminished RIGHT-sided pleural effusion. Adenopathy in the chest and pulmonary nodules in the chest with little changed compared to very recent imaging. Signs of coronary artery calcification and aortic valvular calcification as before. Hepatobiliary: Extensive hepatic involvement also not substantially changed in the very short interval. No intrahepatic biliary duct distension. Portal vein is patent. No pericholecystic stranding. Pancreas: Normal, without mass, inflammation or ductal dilatation. Spleen: Spleen normal size and contour. Adrenals/Urinary Tract: Adrenal glands are normal. Symmetric renal enhancement with small renal cysts bilaterally. No hydronephrosis. Post prostatectomy with urinary bladder at the pelvic floor as before. Urinary bladder under distended without gross adjacent stranding. Stomach/Bowel: No sign of bowel obstruction or acute bowel process in the setting of diffuse peritoneal and omental disease. There is increase in ascites compared to previous imaging, bowel floats in this ascites. Appendix not seen but no pericecal inflammation to suggest appendicitis. Vascular/Lymphatic: Atherosclerosis of the abdominal aorta without aneurysmal dilation. Adenopathy in the retroperitoneum and about the porta hepatis and celiac with no substantial change. (Image 39/2) 19 mm periportal lymph node above the pancreatic head previously 18 mm. Bulky portacaval lymph node (image 43/2) 30 mm short axis, unchanged. IVC with smooth contours. Signs of pelvic lymphadenectomy without pelvic mass. Reproductive: Post prostatectomy. Other: Increase in volume of ascites since previous imaging. Omental caking with similar appearance, dominant area 5.8 x 2.9 cm (image  64/2) stable when measured in a similar fashion by this observer on the previous study with other nodules without gross change. No free air. Musculoskeletal: Diffuse skeletal metastatic disease little changed. Pathologic fracture of LEFT pubic bone superior and inferior pubic ramus as before. Rib destruction and pathologic fracture along the RIGHT posterolateral chest involving RIGHT ninth and tenth ribs with similar appearance. Body wall edema may be slightly increased. IMPRESSION: Interval placement of PleurX catheter into the RIGHT chest with diminished pleural effusion as described. Increase in volume of ascites since previous imaging. Otherwise, no significant interval change or acute findings. Unchanged appearance of pathologic fracture of the LEFT pubic bone. Aortic Atherosclerosis (ICD10-I70.0). Electronically Signed   By: Zetta Bills M.D.   On: 04/21/2021 09:55   US Paracentesis  Result Date: 04/22/2021 INDICATION: Patient with history of advanced prostate cancer, ascites; request received for diagnostic and therapeutic paracentesis. EXAM: ULTRASOUND GUIDED DIAGNOSTIC AND THERAPEUTIC PARACENTESIS MEDICATIONS: 10  mL 1% lidocaine COMPLICATIONS: None immediate. PROCEDURE: Informed written consent was obtained from the patient after a discussion of the risks, benefits and alternatives to treatment. A timeout was performed prior to the initiation of the procedure. Initial ultrasound scanning demonstrates a large amount of ascites within the right lower abdominal quadrant. The right lower abdomen was prepped and draped in the usual sterile fashion. 1% lidocaine was used for local anesthesia. Following this, a 19 gauge, 10-cm, Yueh catheter was introduced. An ultrasound image was saved for documentation purposes. The paracentesis was performed. The catheter was removed and a dressing was applied. The patient tolerated the procedure well without immediate post procedural complication. Patient received  post-procedure intravenous albumin; see nursing notes for details. FINDINGS: A total of approximately 5.6 liters of yellow fluid was removed. Samples were sent to the laboratory as requested by the clinical team. IMPRESSION: Successful ultrasound-guided diagnostic and therapeutic paracentesis yielding 5.6 liters of peritoneal fluid. Read by: Rowe Sulo, PA-C Electronically Signed   By: Jerilynn Mages.  Shick M.D.   On: 04/22/2021 14:38   DG CHEST PORT 1 VIEW  Result Date: 04/21/2021 CLINICAL DATA:  Tachycardia, orthopnea, and hypoxia after receiving fluids. History of type 2 diabetes and CHF. EXAM: PORTABLE CHEST 1 VIEW COMPARISON:  04/21/2021 FINDINGS: Low lung volumes. Stable elevation of the RIGHT hemidiaphragm. Interval increased in mildly prominent interstitial markings. More focal opacity in the LEFT lung base and RIGHT lung apex are again noted, consistent with infectious infiltrates. IMPRESSION: Findings consistent with interstitial edema and persistent bilateral pulmonary infiltrates. Electronically Signed   By: Nolon Nations M.D.   On: 04/21/2021 13:09   DG Chest Port 1 View  Result Date: 04/21/2021 CLINICAL DATA:  Abdominal distension, shortness of breath. EXAM: PORTABLE CHEST 1 VIEW COMPARISON:  04/06/2021 FINDINGS: Stable cardiomediastinal contours. There is a right sided chest tube. No pneumothorax or pleural effusion identified. Atelectasis versus scarring within the left midlung appears unchanged. Hazy opacity within the periphery of the right upper lobe is also unchanged. IMPRESSION: 1. Stable exam. No pneumothorax with right chest tube in place. 2. No change in right upper hazy lung opacity and left midlung scar versus atelectasis. Electronically Signed   By: Kerby Moors M.D.   On: 04/21/2021 09:48    Scheduled Meds:  diclofenac Sodium  2 g Topical QID   lidocaine       mouth rinse  15 mL Mouth Rinse BID   pantoprazole (PROTONIX) IV  40 mg Intravenous Q12H   polyethylene glycol  17 g  Oral Daily   senna-docusate  1 tablet Oral BID   tamsulosin  0.4 mg Oral Daily   Continuous Infusions:  cefTRIAXone (ROCEPHIN)  IV 2 g (04/22/21 1539)     LOS: 0 days   Marylu Lund, MD [facility] Pager On Amion  If 7PM-7AM, please contact night-coverage 04/22/2021, 3:42 PM

## 2021-04-22 NOTE — Procedures (Signed)
Ultrasound-guided diagnostic and therapeutic paracentesis performed yielding 5.6 liters of yellow fluid. No immediate complications. A portion of the fluid was sent to the lab for preordered studies. EBL none.

## 2021-04-22 NOTE — Progress Notes (Signed)
Patient checked in from paracentesis.  Denies pain, nausea, discomfort from acid reflux.  Angie Fava, RN

## 2021-04-22 NOTE — Progress Notes (Signed)
Morning labs: K 5.6, Lactic acid 2.7.  MD notified.  Verbal orders for telemetry given.  Angie Fava, RN

## 2021-04-22 NOTE — Progress Notes (Signed)
Pt had dark brown emesis occurrence, 150 cc.  Pt's wife reports wiping "blood" off patient's mouth.  BP 97/65, HR 93.  MD notified.  New orders placed.

## 2021-04-23 ENCOUNTER — Encounter (HOSPITAL_COMMUNITY): Payer: Self-pay | Admitting: Internal Medicine

## 2021-04-23 ENCOUNTER — Other Ambulatory Visit: Payer: 59

## 2021-04-23 DIAGNOSIS — E43 Unspecified severe protein-calorie malnutrition: Secondary | ICD-10-CM | POA: Diagnosis not present

## 2021-04-23 DIAGNOSIS — R180 Malignant ascites: Secondary | ICD-10-CM | POA: Diagnosis not present

## 2021-04-23 DIAGNOSIS — Z515 Encounter for palliative care: Secondary | ICD-10-CM | POA: Diagnosis not present

## 2021-04-23 DIAGNOSIS — Z7189 Other specified counseling: Secondary | ICD-10-CM

## 2021-04-23 DIAGNOSIS — C61 Malignant neoplasm of prostate: Secondary | ICD-10-CM | POA: Diagnosis not present

## 2021-04-23 DIAGNOSIS — N179 Acute kidney failure, unspecified: Secondary | ICD-10-CM | POA: Diagnosis not present

## 2021-04-23 LAB — COMPREHENSIVE METABOLIC PANEL
ALT: 11 U/L (ref 0–44)
AST: 86 U/L — ABNORMAL HIGH (ref 15–41)
Albumin: 2.2 g/dL — ABNORMAL LOW (ref 3.5–5.0)
Alkaline Phosphatase: 466 U/L — ABNORMAL HIGH (ref 38–126)
Anion gap: 11 (ref 5–15)
BUN: 90 mg/dL — ABNORMAL HIGH (ref 6–20)
CO2: 19 mmol/L — ABNORMAL LOW (ref 22–32)
Calcium: 8.1 mg/dL — ABNORMAL LOW (ref 8.9–10.3)
Chloride: 98 mmol/L (ref 98–111)
Creatinine, Ser: 1.8 mg/dL — ABNORMAL HIGH (ref 0.61–1.24)
GFR, Estimated: 43 mL/min — ABNORMAL LOW (ref >60)
Glucose, Bld: 93 mg/dL (ref 70–99)
Potassium: 6.1 mmol/L — ABNORMAL HIGH (ref 3.5–5.1)
Sodium: 128 mmol/L — ABNORMAL LOW (ref 135–145)
Total Bilirubin: 0.7 mg/dL (ref 0.3–1.2)
Total Protein: 5.8 g/dL — ABNORMAL LOW (ref 6.5–8.1)

## 2021-04-23 LAB — CBC
HCT: 24.6 % — ABNORMAL LOW (ref 39.0–52.0)
Hemoglobin: 7.4 g/dL — ABNORMAL LOW (ref 13.0–17.0)
MCH: 29.8 pg (ref 26.0–34.0)
MCHC: 30.1 g/dL (ref 30.0–36.0)
MCV: 99.2 fL (ref 80.0–100.0)
Platelets: 206 10*3/uL (ref 150–400)
RBC: 2.48 MIL/uL — ABNORMAL LOW (ref 4.22–5.81)
RDW: 19.7 % — ABNORMAL HIGH (ref 11.5–15.5)
WBC: 9.2 10*3/uL (ref 4.0–10.5)
nRBC: 0 % (ref 0.0–0.2)

## 2021-04-23 LAB — GLUCOSE, CAPILLARY
Glucose-Capillary: 88 mg/dL (ref 70–99)
Glucose-Capillary: 114 mg/dL — ABNORMAL HIGH (ref 70–99)
Glucose-Capillary: 96 mg/dL (ref 70–99)
Glucose-Capillary: 95 mg/dL (ref 70–99)

## 2021-04-23 LAB — PH, BODY FLUID: pH, Body Fluid: 7.3

## 2021-04-23 LAB — HEMOGLOBIN AND HEMATOCRIT, BLOOD
HCT: 24.9 % — ABNORMAL LOW (ref 39.0–52.0)
Hemoglobin: 7.5 g/dL — ABNORMAL LOW (ref 13.0–17.0)

## 2021-04-23 LAB — PROCALCITONIN: Procalcitonin: 2.95 ng/mL

## 2021-04-23 LAB — CYTOLOGY - NON PAP

## 2021-04-23 MED ORDER — FUROSEMIDE 10 MG/ML IJ SOLN
INTRAMUSCULAR | Status: AC
  Administered 2021-04-23: 20 mg
  Filled 2021-04-23: qty 4

## 2021-04-23 MED ORDER — FUROSEMIDE 10 MG/ML IJ SOLN: 60.0000 mg | Freq: Once | INTRAMUSCULAR | Status: AC

## 2021-04-23 MED ORDER — FUROSEMIDE 10 MG/ML IJ SOLN
INTRAMUSCULAR | Status: AC
  Administered 2021-04-23: 40 mg
  Filled 2021-04-23: qty 2

## 2021-04-23 MED ORDER — SODIUM ZIRCONIUM CYCLOSILICATE 5 G PO PACK
5.0000 g | PACK | Freq: Every day | ORAL | Status: DC
  Administered 2021-04-23: 5 g via ORAL
  Filled 2021-04-23 (×2): qty 1

## 2021-04-23 NOTE — Consult Note (Signed)
Consultation Note Date: 04/23/2021   Patient Name: Travis Bowman  DOB: Oct 27, 1960  MRN: [id number]  Age / Sex: 60 y.o., male  PCP: Lujean Amel, MD Referring Physician: Donne Hazel, MD  Reason for Consultation: Establishing goals of care and Inpatient hospice referral  HPI/Patient Profile: 60 y.o. male admitted on 04/21/2021   60 y.o. 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  Patient was found to have increasing volume of ascites with concerns for progression of metastatic prostate cancer Clinical Assessment and Goals of Care:  Advanced prostate cancer with metastatic disease to the bone and lymphadenopathy.  He has progressed on multiple therapies and  after discussion with his oncologist on 04-22-21, he opted to discontinue all cancer treatment and proceed with hospice care.    Palliative consult continues.   Patient is awake alert resting in chair, wife at bedside. I introduced myself and palliative care as follows: Palliative medicine is specialized medical care for people living with serious illness. It focuses on providing relief from the symptoms and stress of a serious illness. The goal is to improve quality of life for both the patient and the family. Goals of care: Broad aims of medical therapy in relation to the patient's values and preferences. Our aim is to provide medical care aimed at enabling patients to achieve the goals that matter  most to them, given the circumstances of their particular medical situation and their constraints.   We discussed about his current condition. Goals, wishes and values discussed. Patient wishes to be kept as comfortable as possible, has escalating pain management needs, decreased appetite and ongoing symptom management needs.   We discussed about hospice and levels of hospice, see below.   NEXT OF KIN  Wife   SUMMARY OF RECOMMENDATIONS    DNR DNI Continue current care, focus on comfort measures Residential hospice Prognosis less than 2 weeks Thank you for the consult.   Code Status/Advance Care Planning: DNR   Symptom Management:   Medication history noted.   Palliative Prophylaxis:  Bowel Regimen  Additional Recommendations (Limitations, Scope, Preferences): Full Comfort Care  Psycho-social/Spiritual:  Desire for further Chaplaincy support:yes Additional Recommendations: Education on Hospice  Prognosis:  < 2 weeks  Discharge Planning: Hospice facility      Primary Diagnoses: Present on Admission:  Hypotension  AKI (acute kidney injury) ([place])  Severe protein-calorie malnutrition ([place])  Normocytic anemia  Hyperkalemia  Lactic acidosis  Hyponatremia  GERD (gastroesophageal reflux disease)  Malignant neoplasm of prostate (HCC)  Prostate cancer metastatic to bone ([place])   I have reviewed the medical record, interviewed the patient and family, and examined the patient. The following  aspects are pertinent.  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 May 2015--  renal function recovered   Hypertension    Malignant essential hypertension    Prostate cancer ([place]) dx'd 08/2015   Right knee meniscal tear    Type 2 diabetes mellitus ([place])    Wears contact lenses    Social History   Socioeconomic History   Marital status: Married    Spouse name: Not on file   Number of children: 3   Years of education: 12   Highest education level: Not on file  Occupational History   Occupation: Chemical engineer: [employer]  Tobacco Use   Smoking status: Light Smoker    Types: Cigars   Smokeless tobacco: Never   Tobacco comments:    currently occasoinal cigar--  quit cigarettes in 2007 had smoked for 25 yrs  Vaping Use   Vaping Use: Never used  Substance and Sexual Activity   Alcohol use: Yes    Alcohol/week: 2.0 standard drinks    Types: 2 Cans of beer per week   Drug use: No   Sexual activity: Yes  Other Topics Concern   Not on file  Social History Narrative   Not on file   Social Determinants of Health   Financial Resource Strain: Not on file  Food Insecurity: Not on file  Transportation Needs: Not on file  Physical Activity: Not on file  Stress: Not on file  Social Connections: Not on file   Family History  Problem Relation Age of Onset   Stroke Mother    CAD Mother    Alcohol abuse Father    Diabetes Brother    Marfan syndrome Brother    Scheduled Meds:  furosemide       furosemide       apixaban  5 mg Oral BID   diclofenac Sodium  2 g Topical QID   mouth rinse  15 mL Mouth Rinse BID   midodrine  5 mg Oral TID WC   pantoprazole (PROTONIX) IV  40 mg Intravenous Q12H   polyethylene glycol  17 g Oral Daily   senna-docusate  1 tablet Oral BID   sodium zirconium cyclosilicate  5 g Oral Daily   tamsulosin  0.4 mg Oral Daily   Continuous Infusions:   cefTRIAXone (ROCEPHIN)  IV 2 g (04/22/21 1539)   PRN Meds:.acetaminophen **OR** acetaminophen, alum & mag hydroxide-simeth, calcium carbonate, HYDROmorphone, lip balm, ondansetron **OR** ondansetron (ZOFRAN) IV, oxyCODONE Medications Prior to Admission:  Prior to Admission medications   Medication Sig Start Date End Date Taking? Authorizing Provider  amLODipine (NORVASC) 5 MG tablet Take 5 mg by mouth daily.   Yes [provider]  APIXABAN (ELIQUIS) VTE STARTER PACK (10MG AND 5MG) Take as directed on  package: start with two-31m tablets twice daily for 7 days. On day 8, switch to one-574mtablet twice daily. 04/09/21  Yes Dahal, BiMarlowe AschoffMD  furosemide (LASIX) 40 MG tablet Take 1 tablet (40 mg total) by mouth daily. Patient taking differently: Take 40 mg by mouth 2 (two) times daily. 04/05/21  Yes Shadad, FiMathis DadMD  leuprolide (LUPRON DEPOT, 24-MONTH,) 30 MG injection Inject 30 mg into the muscle every 4 (four) months.   Yes [provider]  lisinopril (PRINIVIL,ZESTRIL) 40 MG tablet Take 20 mg by mouth every evening. 06/12/14  Yes [provider]  oxyCODONE (OXY IR/ROXICODONE) 5 MG immediate release tablet Take 1 tablet (5 mg total) by mouth every 4 (four) hours as needed for severe pain. 04/17/21  Yes Shadad, FiMathis DadMD  OYSCO 500 + D 500-200 MG-UNIT TABS TAKE 1 TABLET BY MOUTH TWICE DAILY Patient taking differently: Take 1 tablet by mouth 2 (two) times daily. 08/22/20  Yes ShWyatt PortelaMD  polyethylene glycol (MIRALAX) 17 g packet Take 17 g by mouth daily. 04/16/21  Yes ShWyatt PortelaMD  senna-docusate (SENNA S) 8.6-50 MG tablet Take 1 tablet by mouth 2 (two) times daily. 04/16/21  Yes ShWyatt PortelaMD  tamsulosin (FLOMAX) 0.4 MG CAPS capsule Take 1 capsule (0.4 mg total) by mouth daily. 03/22/21  Yes GrPatrecia PourMD  Blood Glucose Monitoring Suppl (ACCU-CHEK GUIDE) w/Device KIT Use to test your blood sugar 11/25/18   [provider]  Lancets Micro  Thin 33G MISC Use to check your blood sugar 11/25/18   [provider]   Allergies  Allergen Reactions   Morphine And Related Nausea And Vomiting   Review of Systems +pain in spine Physical Exam Awake alert Abdomen is distended Has edema Appears icteric Generalized weakness Oriented S1 S 2 Regular  Vital Signs: BP (!) 98/48 (BP Location: Right Arm)   Pulse 80   Temp (!) 97.5 F (36.4 C) (Oral)   Resp 18   Ht '5\' 8"'  (1.727 m)   Wt 100.7 kg   SpO2 93%   BMI 33.75 kg/m  Pain Scale: 0-10 POSS *See Group Information*: 1-Acceptable,Awake and alert Pain Score: 0-No pain   SpO2: SpO2: 93 % O2 Device:SpO2: 93 % O2 Flow Rate: .O2 Flow Rate (L/min): 2 L/min  IO: Intake/output summary:  Intake/Output Summary (Last 24 hours) at 04/23/2021 1008 Last data filed at 04/23/2021 1000 Gross per 24 hour  Intake 440 ml  Output 450 ml  Net -10 ml    LBM: Last BM Date: 04/21/21 Baseline Weight: Weight: 100.7 kg Most recent weight: Weight: 100.7 kg     Palliative Assessment/Data:   PPS 30%  Time In:  9 Time Out: 10  Time Total:  60  Greater than 50%  of this time was spent counseling and coordinating care related to the above assessment and plan.  Signed by: ZeLoistine ChanceMD   Please contact Palliative Medicine Team phone at [phone] questions and concerns.  For individual provider: See AmShea Evans

## 2021-04-23 NOTE — Progress Notes (Signed)
PROGRESS NOTE    Travis Bowman  XWR:[id number] DOB: 1961-01-11 DOA: 04/21/2021 PCP: Lujean Amel, MD    Brief Narrative:  60 y.o. 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  Patient was found to have increasing volume of ascites with concerns for progression of metastatic prostate cancer.  Assessment & Plan:   Principal Problem:   AKI (acute kidney injury) ([place]) Active Problems:   Type 2 diabetes mellitus (HCC)   GERD (gastroesophageal reflux disease)   Malignant neoplasm of prostate (HCC)   Hypotension   Severe protein-calorie malnutrition (HCC)   Normocytic anemia   Hyperkalemia   Lactic acidosis   Hyponatremia   Ascites, malignant   Prostate cancer metastatic to bone (HCC)   Pressure injury of skin    Hypotension -Presenting with cholecystitis with systolic blood pressure of 88, reportedly initially improved with IV fluids with albumin. -Continue to hold Norvasc, Lasix, lisinopril per home regimen -Noted to be hypotensive after 10/24 paracentesis with midodrine started -BP currently stable     Lactic acidosis -Lactic acid down to 2.7 from 3.1 at time of admission -Patient otherwise stable     Hyponatremia -Presenting sodium of 126 presumed secondary to volume overload -Repeat sodium today of 128, patient otherwise appears stable -Cont with gross volume overload noted on exam with lower extremity edema -Would cont with lasix as bp allows     Malignant ascites Received albumin earlier. Pt is now s/p IR guided  paracentesis yielding 5.6L fluid -Fluid analysis reviewed. Absolute neutrophils of 30, rules out SBP by fluid analysis -fluid culture neg -Was on ceftriaxone 2 g IVPB every 24 hours empirically. No evidence of sbp. Will hold further abx     Malignant neoplasm of prostate ([place]) -Appreciate input by Oncology -Difficult situation where pt has not tolerated chemo and continues to decline -Appreciate assistance by Palliative Care. Plan for residential hospice with prognosis <2 weeks -Hospice following, no beds today     Severe protein-calorie malnutrition ([place]) -Protein or protein supplementation as tolerated. -Cont to encourage PO intake as tolerated     Type 2 diabetes mellitus (HCC) -Carbohydrate modified diet. -Cont SSI coverage as needed -Glycemic trends reviewed, remains stable     GERD (gastroesophageal reflux disease) -Recent N/V on 10/24 with bloody appearing emesis -Continued eliquis given recent PE -Continued on PPI to 40mg  IV BID     Normocytic anemia -Pt with recent dark, bloody appearing emesis, resolved -Hemodynamically stable and no further episodes -continued eliquis given risk/benefits with recently diagnosed PE -Continued protonix per above     Hyperkalemia -Have ordered lokelma with lasix ordered  Hx PE -Recent admit for PE noted -Eliquis has been continued per above   DVT prophylaxis: Eliquis Code Status: DNR Family Communication: Pt in room, family at bedside  Status is: Inpatient  The patient is inpatient because: severity of illness   Consultants:  Oncology Palliative Care IR  Procedures:  IR guided paracentesis 10/24  Antimicrobials: Anti-infectives (From admission, onward)    Start     Dose/Rate Route Frequency Ordered Stop   04/21/21 1600  cefTRIAXone (ROCEPHIN) 2 g in sodium chloride 0.9 %  100 mL IVPB       Note to Pharmacy: SBP.   2 g 200 mL/hr over 30 Minutes Intravenous Every 24 hours 04/21/21 1552          Subjective: Reports mildlly increased abd distension since paracentesis  Objective: Vitals:   04/22/21 2038 04/22/21 2143 04/23/21 0513 04/23/21 0727  BP: (!) 90/46 (!) 113/44 (!) 114/53 (!) 98/48  Pulse: 86 89 80   Resp: 20 20 18    Temp:  98.3 F (36.8 C) (!) 97.5 F (36.4 C)   TempSrc:  Oral Oral   SpO2:  95% 99% 93%  Weight:      Height:        Intake/Output Summary (Last 24 hours) at 04/23/2021 1317 Last data filed at 04/23/2021 1000 Gross per 24 hour  Intake 440 ml  Output 300 ml  Net 140 ml    Filed Weights   04/21/21 0708  Weight: 100.7 kg    Examination: General exam: Conversant, in no acute distress Respiratory system: normal chest rise, clear, no audible wheezing Cardiovascular system: regular rhythm, s1-s2 Gastrointestinal system: Distended, decreased BS Central nervous system: No seizures, no tremors Extremities: No cyanosis, BLE edema Skin: No rashes, no pallor Psychiatry: Affect normal // no auditory hallucinations   Data Reviewed: I have personally reviewed following labs and imaging studies  CBC: Recent Labs  Lab 04/18/21 0943 04/21/21 0809 04/21/21 0816 04/22/21 0336 04/23/21 0356 04/23/21 0840  WBC 11.0* 12.1*  --  12.2* 9.2  --   NEUTROABS 9.3* 10.3*  --   --   --   --   HGB 8.6* 8.7* 8.8* 8.5* 7.4* 7.5*  HCT 27.3* 28.7* 26.0* 28.3* 24.6* 24.9*  MCV 95.5 96.0  --  98.6 99.2  --   PLT 309 262  --  246 206  --     Basic Metabolic Panel: Recent Labs  Lab 04/18/21 1030 04/21/21 0809 04/21/21 0816 04/22/21 0336 04/23/21 0356  NA 130* 126* 126* 129* 128*  K 5.3* 5.7* 5.7* 5.6* 6.1*  CL 100 96* 100 95* 98  CO2 20* 19*  --  21* 19*  GLUCOSE 106* 113* 109* 101* 93  BUN 76* 83* 91* 84* 90*  CREATININE 1.10 1.34* 1.60* 1.49* 1.80*  CALCIUM 8.5* 8.4*  --  8.5* 8.1*    GFR: Estimated Creatinine Clearance: 50.8 mL/min (A) (by C-G formula based on SCr of 1.8 mg/dL (H)). Liver Function Tests: Recent Labs  Lab 04/21/21 0809  04/22/21 0336 04/23/21 0356  AST 104* 100* 86*  ALT 15 13 11   ALKPHOS 697* 524* 466*  BILITOT 0.8 0.7 0.7  PROT 6.4* 6.7 5.8*  ALBUMIN 1.8* 2.3* 2.2*    Recent Labs  Lab 04/21/21 0809  LIPASE 23    No results for input(s): AMMONIA in the last 168 hours. Coagulation Profile: No results for input(s): INR, PROTIME in the last 168 hours.  Cardiac Enzymes: No results for input(s): CKTOTAL, CKMB, CKMBINDEX, TROPONINI in the last 168 hours. BNP (last 3 results) No results for input(s): PROBNP in the last 8760 hours. HbA1C: No results for input(s): HGBA1C in the last 72 hours. CBG: Recent Labs  Lab 04/22/21 1140 04/22/21 1646 04/22/21 2140 04/23/21 0724 04/23/21 1102  GLUCAP 97 92 92 88 114*    Lipid Profile: No results for input(s): CHOL, HDL, LDLCALC, TRIG, CHOLHDL, LDLDIRECT in the last 72 hours. Thyroid Function Tests: No results for input(s): TSH, T4TOTAL, FREET4, T3FREE, THYROIDAB in the last 72 hours. Anemia Panel:  No results for input(s): VITAMINB12, FOLATE, FERRITIN, TIBC, IRON, RETICCTPCT in the last 72 hours. Sepsis Labs: Recent Labs  Lab 04/21/21 0809 04/22/21 0336 04/23/21 0356  PROCALCITON 2.45 3.95 2.95  LATICACIDVEN 3.1* 2.7*  --      Recent Results (from the past 240 hour(s))  Resp Panel by RT-PCR (Flu A&B, Covid) Nasopharyngeal Swab     Status: None   Collection Time: 04/21/21  8:09 AM   Specimen: Nasopharyngeal Swab; Nasopharyngeal(NP) swabs in vial transport medium  Result Value Ref Range Status   SARS Coronavirus 2 by RT PCR NEGATIVE NEGATIVE Final    Comment: (NOTE) SARS-CoV-2 target nucleic acids are NOT DETECTED.  The SARS-CoV-2 RNA is generally detectable in upper respiratory specimens during the acute phase of infection. The lowest concentration of SARS-CoV-2 viral copies this assay can detect is 138 copies/mL. A negative result does not preclude SARS-Cov-2 infection and should not be used as the sole basis for treatment or other  patient management decisions. A negative result may occur with  improper specimen collection/handling, submission of specimen other than nasopharyngeal swab, presence of viral mutation(s) within the areas targeted by this assay, and inadequate number of viral copies(<138 copies/mL). A negative result must be combined with clinical observations, patient history, and epidemiological information. The expected result is Negative.  Fact Sheet for Patients:  EntrepreneurPulse.com.au  Fact Sheet for Healthcare Providers:  IncredibleEmployment.be  This test is no t yet approved or cleared by the [place] FDA and  has been authorized for detection and/or diagnosis of SARS-CoV-2 by FDA under an Emergency Use Authorization (EUA). This EUA will remain  in effect (meaning this test can be used) for the duration of the COVID-19 declaration under Section 564(b)(1) of the Act, 21 U.S.C.section 360bbb-3(b)(1), unless the authorization is terminated  or revoked sooner.       Influenza A by PCR NEGATIVE NEGATIVE Final   Influenza B by PCR NEGATIVE NEGATIVE Final    Comment: (NOTE) The Xpert Xpress SARS-CoV-2/FLU/RSV plus assay is intended as an aid in the diagnosis of influenza from Nasopharyngeal swab specimens and should not be used as a sole basis for treatment. Nasal washings and aspirates are unacceptable for Xpert Xpress SARS-CoV-2/FLU/RSV testing.  Fact Sheet for Patients: EntrepreneurPulse.com.au  Fact Sheet for Healthcare Providers: IncredibleEmployment.be  This test is not yet approved or cleared by the [place] FDA and has been authorized for detection and/or diagnosis of SARS-CoV-2 by FDA under an Emergency Use Authorization (EUA). This EUA will remain in effect (meaning this test can be used) for the duration of the COVID-19 declaration under Section 564(b)(1) of the Act, 21 U.S.C. section  360bbb-3(b)(1), unless the authorization is terminated or revoked.  Performed at [facility], [place] [address] [place], [place] [address]   Blood culture (routine x 2)     Status: None (Preliminary result)   Collection Time: 04/21/21  8:09 AM   Specimen: BLOOD  Result Value Ref Range Status   Specimen Description   Final    BLOOD RIGHT ANTECUBITAL Performed at [facility] [address], [place], [place] [address]    Special Requests   Final    BOTTLES DRAWN AEROBIC AND ANAEROBIC Blood Culture adequate volume Performed at [facility] [address], [place], [place] [address]    Culture   Final    NO GROWTH 2 DAYS Performed at [facility] [address], [place],  [address]    Report Status  PENDING  Incomplete  Body fluid culture w Gram Stain     Status: None (Preliminary result)   Collection Time: 04/22/21  1:31 PM   Specimen: PATH Cytology Peritoneal fluid  Result Value Ref Range Status   Specimen Description   Final    PERITONEAL Performed at [facility] [address], [place], [place] [address]    Special Requests   Final    NONE Performed at [facility], [place] [address], [place], [place] [address]    Gram Stain   Final    NO SQUAMOUS EPITHELIAL CELLS SEEN NO WBC SEEN NO ORGANISMS SEEN    Culture   Final    NO GROWTH < 12 HOURS Performed at [facility], [place] [address], [place], [place] [address]    Report Status PENDING  Incomplete      Radiology Studies: DG Chest 1 View  Result Date: 04/22/2021 CLINICAL DATA:  Dyspnea. EXAM: CHEST  1 VIEW COMPARISON:  Radiograph yesterday. FINDINGS: Persistent low lung volumes. Patchy and nodular bilateral airspace opacities are similar to prior exam. Right chest tube projects over the right lung base. Small right pleural effusion. Stable heart size and mediastinal contours. No pneumothorax. IMPRESSION:  1. Stable radiographic appearance of the chest. Low lung volumes. Unchanged patchy and nodular bilateral airspace opacities. 2. Small right pleural effusion. 3. Right chest tube in place. No pneumothorax. Electronically Signed   By: Keith Rake M.D.   On: 04/22/2021 02:02   US Paracentesis  Result Date: 04/22/2021 INDICATION: Patient with history of advanced prostate cancer, ascites; request received for diagnostic and therapeutic paracentesis. EXAM: ULTRASOUND GUIDED DIAGNOSTIC AND THERAPEUTIC PARACENTESIS MEDICATIONS: 10 mL 1% lidocaine COMPLICATIONS: None immediate. PROCEDURE: Informed written consent was obtained from the patient after a discussion of the risks, benefits and alternatives to treatment. A timeout was performed prior to the initiation of the procedure. Initial ultrasound scanning demonstrates a large amount of ascites within the right lower abdominal quadrant. The right lower abdomen was prepped and draped in the usual sterile fashion. 1% lidocaine was used for local anesthesia. Following this, a 19 gauge, 10-cm, Yueh catheter was introduced. An ultrasound image was saved for documentation purposes. The paracentesis was performed. The catheter was removed and a dressing was applied. The patient tolerated the procedure well without immediate post procedural complication. Patient received post-procedure intravenous albumin; see nursing notes for details. FINDINGS: A total of approximately 5.6 liters of yellow fluid was removed. Samples were sent to the laboratory as requested by the clinical team. IMPRESSION: Successful ultrasound-guided diagnostic and therapeutic paracentesis yielding 5.6 liters of peritoneal fluid. Read by: Rowe Connery, PA-C Electronically Signed   By: Jerilynn Mages.  Shick M.D.   On: 04/22/2021 14:38    Scheduled Meds:  apixaban  5 mg Oral BID   diclofenac Sodium  2 g Topical QID   mouth rinse  15 mL Mouth Rinse BID   midodrine  5 mg Oral TID WC   pantoprazole (PROTONIX) IV   40 mg Intravenous Q12H   polyethylene glycol  17 g Oral Daily   senna-docusate  1 tablet Oral BID   sodium zirconium cyclosilicate  5 g Oral Daily   tamsulosin  0.4 mg Oral Daily   Continuous Infusions:  cefTRIAXone (ROCEPHIN)  IV 2 g (04/22/21 1539)     LOS: 1 day   Marylu Lund, MD [facility] Pager On Amion  If 7PM-7AM, please contact night-coverage 04/23/2021, 1:17 PM

## 2021-04-23 NOTE — TOC Progression Note (Signed)
Transition of Care [facility]) - Progression Note    Patient Details  Name: Travis Bowman MRN: [id number] Date of Birth: 1960-07-18  [facility] CM/SW Contact  Purcell Mouton, RN Phone Number: 04/23/2021, 10:33 AM  Clinical Narrative:    [name] was selected. Referral given to in house rep Audrea Muscat, RN with [facility]. Waiting for a bed.    Expected Discharge Plan: [place] Barriers to Discharge: No Barriers Identified  Expected Discharge Plan and Services Expected Discharge Plan: [place]   Discharge Planning Services: CM Consult Post Acute Care Choice: Hospice Living arrangements for the past 2 months: Single Family Home                                       Social Determinants of Health (SDOH) Interventions    Readmission Risk Interventions Readmission Risk Prevention Plan 04/09/2021  Post Dischage Appt Complete  Medication Screening Complete  Transportation Screening Complete  Some recent data might be hidden

## 2021-04-23 NOTE — Progress Notes (Signed)
[facility]  [facility]) Hospital Liaison Note  Received request from Transitions of Care Manager (Myraette McGibboney) for family interest in [facility]. Chart reviewed and spoke with family to acknowledge referral. Unfortunately [facility] is not able to offer a room today. Family and Transitions of Care Manager aware hospital liaison will follow up tomorrow or sooner if room becomes available. Please do not hesitate to call with questions. Thank you.   Zollie Pee [facility] Liaison  [phone]

## 2021-04-24 DIAGNOSIS — C61 Malignant neoplasm of prostate: Secondary | ICD-10-CM

## 2021-04-24 DIAGNOSIS — C7951 Secondary malignant neoplasm of bone: Secondary | ICD-10-CM

## 2021-04-24 DIAGNOSIS — N179 Acute kidney failure, unspecified: Secondary | ICD-10-CM | POA: Diagnosis not present

## 2021-04-24 DIAGNOSIS — R531 Weakness: Secondary | ICD-10-CM

## 2021-04-24 DIAGNOSIS — Z7189 Other specified counseling: Secondary | ICD-10-CM | POA: Diagnosis not present

## 2021-04-24 DIAGNOSIS — Z515 Encounter for palliative care: Secondary | ICD-10-CM

## 2021-04-24 DIAGNOSIS — E119 Type 2 diabetes mellitus without complications: Secondary | ICD-10-CM

## 2021-04-24 DIAGNOSIS — D649 Anemia, unspecified: Secondary | ICD-10-CM

## 2021-04-24 DIAGNOSIS — E875 Hyperkalemia: Secondary | ICD-10-CM

## 2021-04-24 DIAGNOSIS — E871 Hypo-osmolality and hyponatremia: Secondary | ICD-10-CM

## 2021-04-24 DIAGNOSIS — E43 Unspecified severe protein-calorie malnutrition: Secondary | ICD-10-CM

## 2021-04-24 DIAGNOSIS — K219 Gastro-esophageal reflux disease without esophagitis: Secondary | ICD-10-CM

## 2021-04-24 DIAGNOSIS — R180 Malignant ascites: Secondary | ICD-10-CM | POA: Diagnosis not present

## 2021-04-24 DIAGNOSIS — E872 Acidosis, unspecified: Secondary | ICD-10-CM

## 2021-04-24 LAB — GLUCOSE, CAPILLARY
Glucose-Capillary: 94 mg/dL (ref 70–99)
Glucose-Capillary: 125 mg/dL — ABNORMAL HIGH (ref 70–99)

## 2021-04-24 MED ORDER — APIXABAN 5 MG PO TABS: 5.0000 mg | ORAL_TABLET | Freq: Two times a day (BID) | ORAL | Status: AC

## 2021-04-24 MED ORDER — HYDROMORPHONE HCL 2 MG PO TABS: 1.0000 mg | ORAL_TABLET | ORAL | Status: AC | PRN

## 2021-04-24 MED ORDER — PANTOPRAZOLE SODIUM 40 MG PO TBEC: 40.0000 mg | DELAYED_RELEASE_TABLET | Freq: Every day | ORAL | Status: AC

## 2021-04-24 MED ORDER — ONDANSETRON HCL 4 MG PO TABS: 4.0000 mg | ORAL_TABLET | Freq: Four times a day (QID) | ORAL | 0 refills | Status: AC | PRN

## 2021-04-24 NOTE — TOC Progression Note (Signed)
Transition of Care [facility]) - Progression Note    Patient Details  Name: Travis Bowman MRN: [id number] Date of Birth: 02-Feb-1961  Transition of Care [facility]) CM/SW Contact  Purcell Mouton, RN Phone Number: 04/24/2021, 10:13 AM  Clinical Narrative:    There are no beds available at [facility] today.    Expected Discharge Plan: [place] Barriers to Discharge: No Barriers Identified  Expected Discharge Plan and Services Expected Discharge Plan: [place]   Discharge Planning Services: CM Consult Post Acute Care Choice: Hospice Living arrangements for the past 2 months: Single Family Home Expected Discharge Date: 04/24/21                                     Social Determinants of Health (SDOH) Interventions    Readmission Risk Interventions Readmission Risk Prevention Plan 04/09/2021  Post Dischage Appt Complete  Medication Screening Complete  Transportation Screening Complete  Some recent data might be hidden

## 2021-04-24 NOTE — Progress Notes (Signed)
[facility]) Hospital Liaison Note  [place] is able to offer a room today. Consents have been completed electronically. [facility] has been called for transport.  Please send signed and completed DNR with patient.  RN please call report to [phone].  Please call with any questions.  Thank you, Margaretmary Eddy, BSN, RN [facility] Liaison [phone]

## 2021-04-24 NOTE — Plan of Care (Signed)
  Problem: Education: Goal: Knowledge of General Education information will improve Description: Including pain rating scale, medication(s)/side effects and non-pharmacologic comfort measures Outcome: Progressing   Problem: Pain Managment: Goal: General experience of comfort will improve Outcome: Progressing   Problem: Safety: Goal: Ability to remain free from injury will improve Outcome: Progressing   

## 2021-04-24 NOTE — Plan of Care (Signed)
  Problem: Education: Goal: Knowledge of General Education information will improve Description: Including pain rating scale, medication(s)/side effects and non-pharmacologic comfort measures 04/24/2021 1324 by Lennie Hummer, RN Outcome: Adequate for Discharge 04/24/2021 1215 by Lennie Hummer, RN Outcome: Progressing   Problem: Health Behavior/Discharge Planning: Goal: Ability to manage health-related needs will improve Outcome: Adequate for Discharge   Problem: Clinical Measurements: Goal: Ability to maintain clinical measurements within normal limits will improve Outcome: Adequate for Discharge Goal: Will remain free from infection Outcome: Adequate for Discharge Goal: Diagnostic test results will improve Outcome: Adequate for Discharge Goal: Respiratory complications will improve Outcome: Adequate for Discharge Goal: Cardiovascular complication will be avoided Outcome: Adequate for Discharge   Problem: Activity: Goal: Risk for activity intolerance will decrease Outcome: Adequate for Discharge   Problem: Nutrition: Goal: Adequate nutrition will be maintained Outcome: Adequate for Discharge   Problem: Coping: Goal: Level of anxiety will decrease Outcome: Adequate for Discharge   Problem: Elimination: Goal: Will not experience complications related to bowel motility Outcome: Adequate for Discharge Goal: Will not experience complications related to urinary retention Outcome: Adequate for Discharge   Problem: Pain Managment: Goal: General experience of comfort will improve 04/24/2021 1324 by Lennie Hummer, RN Outcome: Adequate for Discharge 04/24/2021 1215 by Lennie Hummer, RN Outcome: Progressing   Problem: Safety: Goal: Ability to remain free from injury will improve 04/24/2021 1324 by Lennie Hummer, RN Outcome: Adequate for Discharge 04/24/2021 1215 by Lennie Hummer, RN Outcome: Progressing   Problem: Skin Integrity: Goal: Risk for impaired skin  integrity will decrease Outcome: Adequate for Discharge

## 2021-04-24 NOTE — Progress Notes (Signed)
Pt D/c to [facility] via [facility]. Report given to Newman Nickels RN.

## 2021-04-24 NOTE — Discharge Summary (Signed)
Physician Discharge Summary  TYREEK CLABO GHW:[id number] DOB: 1961-04-25 DOA: 04/21/2021  PCP: Lujean Amel, MD  Admit date: 04/21/2021 Discharge date: 04/24/2021  Admitted From: Home Disposition: Hospice   Recommendations for Outpatient Follow-up:  Comfort measures. Pain well controlled on oxycodone and dilaudid.   Home Health: NA Equipment/Devices: NA Discharge Condition: Stable for transfer to hospice CODE STATUS: DNR Diet recommendation: As tolerated  Brief/Interim Summary: 60 y.o. 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  Patient was found to have increasing volume of ascites with concerns for progression of metastatic prostate cancer.  Advanced prostate cancer with metastatic disease to the bone and lymphadenopathy.  He has progressed on multiple therapies and  after discussion with his oncologist on 04-22-21, he opted to discontinue all cancer treatment and proceed with hospice care.   Discharge Diagnoses:  Principal Problem:   AKI (acute kidney injury) ([place]) Active Problems:   Type 2 diabetes mellitus (HCC)   GERD (gastroesophageal reflux disease)   Malignant neoplasm of prostate (HCC)   Hypotension   Severe protein-calorie malnutrition (HCC)   Normocytic anemia   Hyperkalemia   Lactic acidosis   Hyponatremia   Ascites, malignant   Prostate cancer metastatic to bone (HCC)   Pressure injury of skin   Hospice care patient    Hypotension: Continue to hold Norvasc, Lasix, lisinopril per home regimen. Midodrine  was added while hospitalized. Consider this if symptomatic hypotension at hospice.      Lactic acidosis -Lactic acid down to 2.7 from 3.1 at time of admission -Patient otherwise stable     Hyponatremia -Presenting sodium of 126 presumed secondary to volume overload. No further checks planned.     Malignant ascites:  Received several vials of albumin for hypotension support.  Pt is now s/p IR guided paracentesis yielding 5.6L fluid -Fluid analysis reviewed. Absolute neutrophils of 30, rules out SBP by fluid analysis -fluid culture neg -Was on ceftriaxone 2 g IVPB every 24 hours empirically. No evidence of sbp. Will hold further abx     Malignant neoplasm of prostate ([place]) -Appreciate input by Oncology: Difficult situation where pt has not tolerated chemo and continues to decline -Appreciate assistance by Palliative Care. Plan for residential hospice with prognosis <2 weeks     Severe protein-calorie malnutrition ([place]) -Protein or protein supplementation as tolerated. -Cont to encourage PO intake as tolerated     Type 2 diabetes mellitus ([place]) -No treatments planned at hospice. HbA1c 5.4%.       GERD (gastroesophageal reflux disease) -Recent N/V on 10/24 with bloody appearing emesis -Continued eliquis given recent PE -Continued on PPI       Normocytic anemia -Pt with recent dark, bloody appearing emesis, resolved -Hemodynamically stable and no further episodes -continued eliquis given risk/benefits with recently diagnosed PE -Continued protonix per above     Hyperkalemia: Improved withy lokelma. No further lab draws planned.   Hx PE -Recent admit for PE noted -Eliquis has been continued per above  Obesity: Body mass index is 33.75 kg/m.    RN Pressure Injury Documentation: Pressure Injury 04/22/21 Coccyx Medial;Lower Stage 1 -  Intact skin with non-blanchable  redness of a localized area usually over a bony prominence. Small area of nonblanchable redness (Active)   04/22/21 1857  Location: Coccyx  Location Orientation: Medial;Lower  Staging: Stage 1 -  Intact skin with non-blanchable redness of a localized area usually over a bony prominence.  Wound Description (Comments): Small area of nonblanchable redness  Present on Admission: No   Discharge Instructions  Allergies as of 04/24/2021       Reactions   Morphine And Related Nausea And Vomiting        Medication List     STOP taking these medications    amLODipine 5 MG tablet Commonly known as: NORVASC   Apixaban Starter Pack (8m and 51m Commonly known as: ELIQUIS STARTER PACK Replaced by: apixaban 5 MG Tabs tablet   lisinopril 40 MG tablet Commonly known as: ZESTRIL   Lupron Depot (77-Month) 30 MG injection Generic drug: leuprolide       TAKE these medications    Accu-Chek Guide w/Device Kit Use to test your blood sugar   apixaban 5 MG Tabs tablet Commonly known as: ELIQUIS Take 1 tablet (5 mg total) by mouth 2 (two) times daily. Replaces: Apixaban Starter Pack (1014mnd 5mg57m furosemide 40 MG tablet Commonly known as: Lasix Take 1 tablet (40 mg total) by mouth daily. What changed: when to take this   HYDROmorphone 2 MG tablet Commonly known as: DILAUDID Take 0.5 tablets (1 mg total) by mouth every 2 (two) hours as needed for severe pain.   Lancets Micro Thin 33G Misc Use to check your blood sugar   ondansetron 4 MG tablet Commonly known as: ZOFRAN Take 1 tablet (4 mg total) by mouth every 6 (six) hours as needed for nausea.   oxyCODONE 5 MG immediate release tablet Commonly known as: Oxy IR/ROXICODONE Take 1 tablet (5 mg total) by mouth every 4 (four) hours as needed for severe pain.   OYSCO 500 + D 500-5 MG-MCG Tabs Generic drug: Calcium Carb-Cholecalciferol TAKE 1 TABLET BY MOUTH TWICE DAILY   pantoprazole 40 MG tablet Commonly known as: Protonix Take 1 tablet (40 mg total) by mouth daily.   polyethylene glycol 17 g packet Commonly known as:  MiraLax Take 17 g by mouth daily.   senna-docusate 8.6-50 MG tablet Commonly known as: Senna S Take 1 tablet by mouth 2 (two) times daily.   tamsulosin 0.4 MG Caps capsule Commonly known as: FLOMAX Take 1 capsule (0.4 mg total) by mouth daily.        Allergies  Allergen Reactions   Morphine And Related Nausea And Vomiting    Consultations: Palliative Oncology  Procedures/Studies: DG Chest 1 View  Result Date: 04/22/2021 CLINICAL DATA:  Dyspnea. EXAM: CHEST  1 VIEW COMPARISON:  Radiograph yesterday. FINDINGS: Persistent low lung volumes. Patchy and nodular bilateral airspace opacities are similar to prior exam. Right chest tube projects over the right lung base. Small right pleural effusion. Stable heart size and mediastinal contours. No pneumothorax. IMPRESSION: 1. Stable radiographic appearance of the chest. Low lung volumes. Unchanged patchy and nodular bilateral airspace opacities. 2. Small right pleural effusion. 3. Right chest tube in place. No pneumothorax. Electronically Signed   By: MelaKeith Rake.   On: 04/22/2021 02:02   DG Chest 2 View  Result Date: 04/08/2021 CLINICAL DATA:  Shortness of breath. EXAM: CHEST - 2 VIEW COMPARISON:  03/21/2021 FINDINGS: Patchy bilateral airspace disease noted right mid lung and left mid and lower lung. Stable asymmetric elevation right hemidiaphragm. Tiny  right pleural effusion. Cardiopericardial silhouette is at upper limits of normal for size. Telemetry leads overlie the chest. IMPRESSION: Patchy bilateral airspace disease with tiny right pleural effusion. Multifocal pneumonia concern. Electronically Signed   By: Misty Stanley M.D.   On: 04/08/2021 11:01   CT Angio Chest PE W/Cm &/Or Wo Cm  Result Date: 04/08/2021 CLINICAL DATA:  Shortness of breath with abdominal and scrotal swelling for 2-3 weeks. History of prostate cancer. Clinical concern for pulmonary embolism. EXAM: CT ANGIOGRAPHY CHEST CT ABDOMEN AND PELVIS WITH CONTRAST  TECHNIQUE: Multidetector CT imaging of the chest was performed using the standard protocol during bolus administration of intravenous contrast. Multiplanar CT image reconstructions and MIPs were obtained to evaluate the vascular anatomy. Multidetector CT imaging of the abdomen and pelvis was performed using the standard protocol during bolus administration of intravenous contrast. CONTRAST:  48m OMNIPAQUE IOHEXOL 350 MG/ML SOLN COMPARISON:  Chest CTA 03/21/2021. Chest CT 03/19/2021, PET-CT 12/19/2020 and abdominal CT 06/01/2020. FINDINGS: CTA CHEST FINDINGS Cardiovascular: The pulmonary arteries are suboptimally opacified with contrast to the level of the subsegmental branches. There is probable acute nonocclusive thrombus within the main and upper lobe lobar branches of the right pulmonary artery. In addition, there are probable small emboli within segmental branches of the right lower lobe. No definite left-sided emboli are seen. No significant right ventricular dilatation the heart size is normal. There is no significant pericardial effusion. Atherosclerosis of the aorta, great vessels and coronary arteries noted. Mediastinum/Nodes: Again demonstrated are multiple enlarged mediastinal, hilar and left supraclavicular lymph nodes. There is a 1.5 cm AP window node on image 28/4 and a 2.4 cm subcarinal node on image 38/4. There are small left axillary lymph nodes. Overall, this thoracic adenopathy appears similar to recent previous study. The thyroid gland, trachea and esophagus demonstrate no significant findings. Lungs/Pleura: Moderate-sized dependent right pleural effusion has mildly decreased in volume compared with the recent prior study. No pneumothorax or significant left pleural effusion. There is stable atelectasis at the right lung base. Underlying emphysematous changes and diffuse subpleural reticulation are noted. There is increased ill-defined nodularity peripherally in the right upper lobe with a  component measuring up to 1.4 cm on image 35/6. There is additional nodularity in the left lower lobe, measuring up to 9 mm on image 60/6. These nodules have enlarged compared with older prior studies and are suspicious for metastases. Musculoskeletal/Chest wall: Widespread osseous metastatic disease to the ribs and thoracic spine with multiple pathologic rib fractures, similar to recent CT. No significant pathologic fracture or epidural tumor identified in the spine. CT ABDOMEN AND PELVIS FINDINGS Hepatobiliary: Widespread hepatic metastatic disease is again noted with approximately 50% replacement of the normal hepatic parenchyma. This appears similar to the recent chest CT and is new compared with the abdominal CT from 06/01/2020. The liver is mildly enlarged. Possible small calcified gallstone. No significant gallbladder wall thickening or biliary dilatation. Pancreas: Unremarkable. No pancreatic ductal dilatation or surrounding inflammatory changes. Spleen: Normal in size without focal abnormality. Adrenals/Urinary Tract: Both adrenal glands appear normal. Several low-density renal lesions are similar to prior abdominal CT and likely cysts. No definite enhancing renal mass. No evidence of urinary tract calculus or hydronephrosis. The bladder appears unchanged. Stomach/Bowel: There may be a small amount of enteric contrast within the small bowel. No evidence of bowel distension, wall thickening or focal surrounding inflammation. The appendix appears normal. Vascular/Lymphatic: There are multiple enlarged retroperitoneal and pelvic lymph nodes, including a 3.1 cm portacaval node on image 41/2,  a 1.7 cm right pericaval node on image 42/2 and a 1.6 cm left inguinal node on image 88/2. Aortic and branch vessel atherosclerosis without aneurysm or large vessel occlusion. No venous thrombosis identified. Reproductive: Status post prostatectomy without recurrent mass lesion. Other: Interval development of ascites with  diffuse subcutaneous edema. There are multiple peritoneal and omental soft tissue nodules consistent with widespread peritoneal metastatic disease. Representative lesions include a 5.9 x 2.0 cm right upper quadrant peritoneal nodule on image 46/2 and a central omental implant measuring 5.8 x 2.5 cm on image 61/2. Musculoskeletal: Widespread osseous metastatic disease with pathologic fracture involving the left pubic rami. No pathologic fracture or significant epidural tumor seen in the lumbar spine. Review of the MIP images confirms the above findings. IMPRESSION: 1. Study is positive for lobar and segmental pulmonary emboli on the right. No signs of right heart strain. 2. Widespread metastatic disease to multiple lymph nodes in the chest, abdomen and pelvis, the liver, the peritoneum and multiple bones. There are pathologic fractures of multiple ribs and the left pubic rami. Ascites with multiple peritoneal metastases. 3. No evidence of hydronephrosis or bowel obstruction. 4. Moderate size right pleural effusion has mildly decreased in size compared with recent CT. 5. Critical Value/emergent results were called by telephone at the time of interpretation on 04/08/2021 at 2:40 pm to provider Vivi Martens, who verbally acknowledged these results. Electronically Signed   By: Richardean Sale M.D.   On: 04/08/2021 14:41   [place] CHEST (PLEURAL EFFUSION)  Result Date: 04/15/2021 CLINICAL DATA:  Question resolving pleural effusion EXAM: CHEST ULTRASOUND COMPARISON:  CT 04/08/2021 FINDINGS: Ultrasound of the right chest demonstrates moderate right pleural effusion, likely not significantly changed since prior CT. No visible left effusion. IMPRESSION: Moderate right pleural effusion, likely not significantly changed since prior CT. Electronically Signed   By: Rolm Baptise M.D.   On: 04/15/2021 22:35   CT ABDOMEN PELVIS W CONTRAST  Result Date: 04/21/2021 CLINICAL DATA:  LEFT lower quadrant abdominal pain in a 60 year old  male with diagnosis of peritoneal carcinomatosis and widespread metastatic disease in the setting of prostate neoplasm. EXAM: CT ABDOMEN AND PELVIS WITH CONTRAST TECHNIQUE: Multidetector CT imaging of the abdomen and pelvis was performed using the standard protocol following bolus administration of intravenous contrast. CONTRAST:  143m OMNIPAQUE IOHEXOL 350 MG/ML SOLN COMPARISON:  April 08, 2021. FINDINGS: Lower chest: Interval placement of a PleurX catheter into the RIGHT chest, tip in the medial aspect of the RIGHT costodiaphragmatic sulcus. Diminished RIGHT-sided pleural effusion. Adenopathy in the chest and pulmonary nodules in the chest with little changed compared to very recent imaging. Signs of coronary artery calcification and aortic valvular calcification as before. Hepatobiliary: Extensive hepatic involvement also not substantially changed in the very short interval. No intrahepatic biliary duct distension. Portal vein is patent. No pericholecystic stranding. Pancreas: Normal, without mass, inflammation or ductal dilatation. Spleen: Spleen normal size and contour. Adrenals/Urinary Tract: Adrenal glands are normal. Symmetric renal enhancement with small renal cysts bilaterally. No hydronephrosis. Post prostatectomy with urinary bladder at the pelvic floor as before. Urinary bladder under distended without gross adjacent stranding. Stomach/Bowel: No sign of bowel obstruction or acute bowel process in the setting of diffuse peritoneal and omental disease. There is increase in ascites compared to previous imaging, bowel floats in this ascites. Appendix not seen but no pericecal inflammation to suggest appendicitis. Vascular/Lymphatic: Atherosclerosis of the abdominal aorta without aneurysmal dilation. Adenopathy in the retroperitoneum and about the porta hepatis and celiac with no substantial  change. (Image 39/2) 19 mm periportal lymph node above the pancreatic head previously 18 mm. Bulky portacaval lymph  node (image 43/2) 30 mm short axis, unchanged. IVC with smooth contours. Signs of pelvic lymphadenectomy without pelvic mass. Reproductive: Post prostatectomy. Other: Increase in volume of ascites since previous imaging. Omental caking with similar appearance, dominant area 5.8 x 2.9 cm (image 64/2) stable when measured in a similar fashion by this observer on the previous study with other nodules without gross change. No free air. Musculoskeletal: Diffuse skeletal metastatic disease little changed. Pathologic fracture of LEFT pubic bone superior and inferior pubic ramus as before. Rib destruction and pathologic fracture along the RIGHT posterolateral chest involving RIGHT ninth and tenth ribs with similar appearance. Body wall edema may be slightly increased. IMPRESSION: Interval placement of PleurX catheter into the RIGHT chest with diminished pleural effusion as described. Increase in volume of ascites since previous imaging. Otherwise, no significant interval change or acute findings. Unchanged appearance of pathologic fracture of the LEFT pubic bone. Aortic Atherosclerosis (ICD10-I70.0). Electronically Signed   By: Zetta Bills M.D.   On: 04/21/2021 09:55   CT Abdomen Pelvis W Contrast  Result Date: 04/08/2021 CLINICAL DATA:  Shortness of breath with abdominal and scrotal swelling for 2-3 weeks. History of prostate cancer. Clinical concern for pulmonary embolism. EXAM: CT ANGIOGRAPHY CHEST CT ABDOMEN AND PELVIS WITH CONTRAST TECHNIQUE: Multidetector CT imaging of the chest was performed using the standard protocol during bolus administration of intravenous contrast. Multiplanar CT image reconstructions and MIPs were obtained to evaluate the vascular anatomy. Multidetector CT imaging of the abdomen and pelvis was performed using the standard protocol during bolus administration of intravenous contrast. CONTRAST:  4m OMNIPAQUE IOHEXOL 350 MG/ML SOLN COMPARISON:  Chest CTA 03/21/2021. Chest CT 03/19/2021,  PET-CT 12/19/2020 and abdominal CT 06/01/2020. FINDINGS: CTA CHEST FINDINGS Cardiovascular: The pulmonary arteries are suboptimally opacified with contrast to the level of the subsegmental branches. There is probable acute nonocclusive thrombus within the main and upper lobe lobar branches of the right pulmonary artery. In addition, there are probable small emboli within segmental branches of the right lower lobe. No definite left-sided emboli are seen. No significant right ventricular dilatation the heart size is normal. There is no significant pericardial effusion. Atherosclerosis of the aorta, great vessels and coronary arteries noted. Mediastinum/Nodes: Again demonstrated are multiple enlarged mediastinal, hilar and left supraclavicular lymph nodes. There is a 1.5 cm AP window node on image 28/4 and a 2.4 cm subcarinal node on image 38/4. There are small left axillary lymph nodes. Overall, this thoracic adenopathy appears similar to recent previous study. The thyroid gland, trachea and esophagus demonstrate no significant findings. Lungs/Pleura: Moderate-sized dependent right pleural effusion has mildly decreased in volume compared with the recent prior study. No pneumothorax or significant left pleural effusion. There is stable atelectasis at the right lung base. Underlying emphysematous changes and diffuse subpleural reticulation are noted. There is increased ill-defined nodularity peripherally in the right upper lobe with a component measuring up to 1.4 cm on image 35/6. There is additional nodularity in the left lower lobe, measuring up to 9 mm on image 60/6. These nodules have enlarged compared with older prior studies and are suspicious for metastases. Musculoskeletal/Chest wall: Widespread osseous metastatic disease to the ribs and thoracic spine with multiple pathologic rib fractures, similar to recent CT. No significant pathologic fracture or epidural tumor identified in the spine. CT ABDOMEN AND PELVIS  FINDINGS Hepatobiliary: Widespread hepatic metastatic disease is again noted with approximately 50%  replacement of the normal hepatic parenchyma. This appears similar to the recent chest CT and is new compared with the abdominal CT from 06/01/2020. The liver is mildly enlarged. Possible small calcified gallstone. No significant gallbladder wall thickening or biliary dilatation. Pancreas: Unremarkable. No pancreatic ductal dilatation or surrounding inflammatory changes. Spleen: Normal in size without focal abnormality. Adrenals/Urinary Tract: Both adrenal glands appear normal. Several low-density renal lesions are similar to prior abdominal CT and likely cysts. No definite enhancing renal mass. No evidence of urinary tract calculus or hydronephrosis. The bladder appears unchanged. Stomach/Bowel: There may be a small amount of enteric contrast within the small bowel. No evidence of bowel distension, wall thickening or focal surrounding inflammation. The appendix appears normal. Vascular/Lymphatic: There are multiple enlarged retroperitoneal and pelvic lymph nodes, including a 3.1 cm portacaval node on image 41/2, a 1.7 cm right pericaval node on image 42/2 and a 1.6 cm left inguinal node on image 88/2. Aortic and branch vessel atherosclerosis without aneurysm or large vessel occlusion. No venous thrombosis identified. Reproductive: Status post prostatectomy without recurrent mass lesion. Other: Interval development of ascites with diffuse subcutaneous edema. There are multiple peritoneal and omental soft tissue nodules consistent with widespread peritoneal metastatic disease. Representative lesions include a 5.9 x 2.0 cm right upper quadrant peritoneal nodule on image 46/2 and a central omental implant measuring 5.8 x 2.5 cm on image 61/2. Musculoskeletal: Widespread osseous metastatic disease with pathologic fracture involving the left pubic rami. No pathologic fracture or significant epidural tumor seen in the  lumbar spine. Review of the MIP images confirms the above findings. IMPRESSION: 1. Study is positive for lobar and segmental pulmonary emboli on the right. No signs of right heart strain. 2. Widespread metastatic disease to multiple lymph nodes in the chest, abdomen and pelvis, the liver, the peritoneum and multiple bones. There are pathologic fractures of multiple ribs and the left pubic rami. Ascites with multiple peritoneal metastases. 3. No evidence of hydronephrosis or bowel obstruction. 4. Moderate size right pleural effusion has mildly decreased in size compared with recent CT. 5. Critical Value/emergent results were called by telephone at the time of interpretation on 04/08/2021 at 2:40 pm to provider Vivi Martens, who verbally acknowledged these results. Electronically Signed   By: Richardean Sale M.D.   On: 04/08/2021 14:41   US Paracentesis  Result Date: 04/22/2021 INDICATION: Patient with history of advanced prostate cancer, ascites; request received for diagnostic and therapeutic paracentesis. EXAM: ULTRASOUND GUIDED DIAGNOSTIC AND THERAPEUTIC PARACENTESIS MEDICATIONS: 10 mL 1% lidocaine COMPLICATIONS: None immediate. PROCEDURE: Informed written consent was obtained from the patient after a discussion of the risks, benefits and alternatives to treatment. A timeout was performed prior to the initiation of the procedure. Initial ultrasound scanning demonstrates a large amount of ascites within the right lower abdominal quadrant. The right lower abdomen was prepped and draped in the usual sterile fashion. 1% lidocaine was used for local anesthesia. Following this, a 19 gauge, 10-cm, Yueh catheter was introduced. An ultrasound image was saved for documentation purposes. The paracentesis was performed. The catheter was removed and a dressing was applied. The patient tolerated the procedure well without immediate post procedural complication. Patient received post-procedure intravenous albumin; see nursing  notes for details. FINDINGS: A total of approximately 5.6 liters of yellow fluid was removed. Samples were sent to the laboratory as requested by the clinical team. IMPRESSION: Successful ultrasound-guided diagnostic and therapeutic paracentesis yielding 5.6 liters of peritoneal fluid. Read by: Rowe Clenton, PA-C Electronically Signed   By:  M.  Shick M.D.   On: 04/22/2021 14:38   DG CHEST PORT 1 VIEW  Result Date: 04/21/2021 CLINICAL DATA:  Tachycardia, orthopnea, and hypoxia after receiving fluids. History of type 2 diabetes and CHF. EXAM: PORTABLE CHEST 1 VIEW COMPARISON:  04/21/2021 FINDINGS: Low lung volumes. Stable elevation of the RIGHT hemidiaphragm. Interval increased in mildly prominent interstitial markings. More focal opacity in the LEFT lung base and RIGHT lung apex are again noted, consistent with infectious infiltrates. IMPRESSION: Findings consistent with interstitial edema and persistent bilateral pulmonary infiltrates. Electronically Signed   By: Nolon Nations M.D.   On: 04/21/2021 13:09   DG Chest Port 1 View  Result Date: 04/21/2021 CLINICAL DATA:  Abdominal distension, shortness of breath. EXAM: PORTABLE CHEST 1 VIEW COMPARISON:  04/06/2021 FINDINGS: Stable cardiomediastinal contours. There is a right sided chest tube. No pneumothorax or pleural effusion identified. Atelectasis versus scarring within the left midlung appears unchanged. Hazy opacity within the periphery of the right upper lobe is also unchanged. IMPRESSION: 1. Stable exam. No pneumothorax with right chest tube in place. 2. No change in right upper hazy lung opacity and left midlung scar versus atelectasis. Electronically Signed   By: Kerby Moors M.D.   On: 04/21/2021 09:48   DG Chest Port 1 View  Result Date: 04/16/2021 CLINICAL DATA:  Postprocedure.  Chest tube placement. EXAM: PORTABLE CHEST 1 VIEW COMPARISON:  Chest radiograph, 04/08/2021.  CT chest, 04/08/2021. FINDINGS: Support lines: RIGHT  basilar-directed thoracostomy tube. Cardiac silhouette enlargement. Hypoinflation. Perihilar and interstitial opacities. No focal consolidation or mass. No residual RIGHT pleural effusion. Trace RIGHT pneumothorax, best appreciated at the periphery. No acute osseous abnormality. IMPRESSION: 1. Trace RIGHT apical pneumothorax. 2. Well-positioned RIGHT basilar pleural catheter placement. No residual RIGHT pleural. Electronically Signed   By: Michaelle Birks M.D.   On: 04/16/2021 12:12   VAS [place] LOWER EXTREMITY VENOUS (DVT)  Result Date: 04/09/2021  Lower Venous DVT Study Patient Name:  JUANDIEGO KOLENOVIC [facility]  Date of Exam:   04/09/2021 Medical Rec #: [id number]       Accession #:    [id number] Date of Birth: 1960/08/19       Patient Gender: M Patient Age:   60 years Exam Location:  [facility] Procedure:      VAS [place] LOWER EXTREMITY VENOUS (DVT) Referring Phys: Terrilee Croak --------------------------------------------------------------------------------  Indications: Pulmonary embolism.  Risk Factors: Confirmed PE Cancer. Anticoagulation: Heparin. Limitations: Body habitus, poor ultrasound/tissue interface and Left extensive lymphedema. Comparison Study: 03/20/2021 - Negative for DVT. Performing Technologist: Oliver Hum RVT  Examination Guidelines: A complete evaluation includes B-mode imaging, spectral Doppler, color Doppler, and power Doppler as needed of all accessible portions of each vessel. Bilateral testing is considered an integral part of a complete examination. Limited examinations for reoccurring indications may be performed as noted. The reflux portion of the exam is performed with the patient in reverse Trendelenburg.  +---------+---------------+---------+-----------+----------+--------------+ RIGHT    CompressibilityPhasicitySpontaneityPropertiesThrombus Aging +---------+---------------+---------+-----------+----------+--------------+ CFV      Full           Yes      Yes                                  +---------+---------------+---------+-----------+----------+--------------+ SFJ      Full                                                        +---------+---------------+---------+-----------+----------+--------------+  FV Prox  Full                                                        +---------+---------------+---------+-----------+----------+--------------+ FV Mid   Full                                                        +---------+---------------+---------+-----------+----------+--------------+ FV DistalFull                                                        +---------+---------------+---------+-----------+----------+--------------+ PFV      Full                                                        +---------+---------------+---------+-----------+----------+--------------+ POP      Full           Yes      Yes                                 +---------+---------------+---------+-----------+----------+--------------+ PTV      Full                                                        +---------+---------------+---------+-----------+----------+--------------+ PERO     Full                                                        +---------+---------------+---------+-----------+----------+--------------+   +---------+---------------+---------+-----------+----------+-------------------+ LEFT     CompressibilityPhasicitySpontaneityPropertiesThrombus Aging      +---------+---------------+---------+-----------+----------+-------------------+ CFV      None           No       No                   Acute               +---------+---------------+---------+-----------+----------+-------------------+ FV Prox  None           No       No                   Acute               +---------+---------------+---------+-----------+----------+-------------------+ FV Mid   None           No       No                   Acute                +---------+---------------+---------+-----------+----------+-------------------+  FV DistalNone           No       No                   Acute               +---------+---------------+---------+-----------+----------+-------------------+ PFV                                                   Not well visualized +---------+---------------+---------+-----------+----------+-------------------+ POP      Full           Yes      No                                       +---------+---------------+---------+-----------+----------+-------------------+ PTV      Full                                                             +---------+---------------+---------+-----------+----------+-------------------+ PERO     Full                                                             +---------+---------------+---------+-----------+----------+-------------------+ EIV                     Yes      No                                       +---------+---------------+---------+-----------+----------+-------------------+     Summary: RIGHT: - There is no evidence of deep vein thrombosis in the lower extremity.  - No cystic structure found in the popliteal fossa.  LEFT: - Findings consistent with acute deep vein thrombosis involving the left common femoral vein, and left femoral vein. - No cystic structure found in the popliteal fossa.  *See table(s) above for measurements and observations. Electronically signed by Servando Snare MD on 04/09/2021 at 1:50:22 PM.    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 Patient status post radiotherapy, therapy, and chemotherapy. Progressive disease with increasing PSA. New malignant pleural effusion. Prior indication metastatic adenopathy and bone metastasis. EXAM: NUCLEAR MEDICINE PLUVICTO INJECTION TECHNIQUE: Infusion: The nuclear medicine  technologist and I personally verified the dose activity to be delivered as specified in the written directive, and verified the patient identification via 2 separate methods. Initial flush of the intravenous catheter was performed was sterile saline. The dose syringe was connected to the catheter and the Lu-177 Pluvicto administered over a 1 to 10 min infusion. Single 10 cc lushes with normal saline follow the dose. No complications were noted. The entire IV tubing, venocatheter, stopcock and syringes was removed in total, placed in a disposal bag and sent  for assay of the residual activity, which will be reported at a later time in our EMR by the physics staff. Pressure was applied to the venipuncture site, and a compression bandage placed. Patient monitored for 1 hour following infusion. 1.5 L normal saline was administered intravenous during treatment cycle. Radiation Safety personnel were present to perform the discharge survey, as detailed on their documentation. After a short period of observation, the patient had his IV removed. RADIOPHARMACEUTICALS:  Two hundred one microcuries Lu-177 PLUVICTO FINDINGS: Current Infusion: 1 Planned Infusions: 6 Patient presented to nuclear medicine for treatment. Risk and benefits of procedure were explained to patient and his wife. Patient reports back pain prior therapy. The patient's same day blood counts and metabolic panel were reviewed and remains a good candidate to proceed with Lu-177 Pluvicto. Patient has mild renal insufficiency and instructed to hydrate well following therapy. The patient was situated in an infusion suite with a contact barrier placed under the arm. Intravenous access was established, using sterile technique, and a normal saline infusion from a syringe was started. Micro-dosimetry: The prescribed radiation activity was assayed and confirmed to be within specified tolerance. IMPRESSION: Current Infusion: 1 Planned Infusions: 6 The patient tolerated  the infusion well. Patient to return to oncologists in 5 weeks for pre therapy labs. Patient to return [facility] molecular therapeutic department at [facility] in 6 weeks for next therapy. Electronically Signed   By: Suzy Bouchard M.D.   On: 04/18/2021 14:15   IR PERC PLEURAL DRAIN W/INDWELL CATH W/IMG GUIDE  Result Date: 04/16/2021 CLINICAL DATA:  Recurrent, malignant RIGHT pleural effusion. EXAM: TUNNELED RIGHT PLEURAL DRAINAGE CATHETER PLACEMENT (PleurX) COMPARISON:  Chest radiograph, concurrent.  CT chest, 04/08/2021. MEDICATIONS: 2 g Ancef IV. The antibiotics were administered within an appropriate time interval for the procedure. ANESTHESIA/SEDATION: Moderate (conscious) sedation was employed during this procedure. A total of Versed 2 mg and Fentanyl 100 mcg was administered intravenously. Moderate Sedation Time: 28 minutes. The patient's level of consciousness and vital signs were monitored continuously by radiology nursing throughout the procedure under my direct supervision. FLUOROSCOPY TIME:  FLUOROSCOPY TIME 0 min 24 seconds (7 mGy) COMPLICATIONS: None immediate. PROCEDURE: The procedure, risks, benefits, and alternatives were explained to the patient, who wishes to proceed with the placement of this permanent pleural catheter as they are seeking palliative care. The patient understands and consents to the procedure. The RIGHT lateral chest and upper abdomen were prepped and draped in a sterile fashion, and a sterile drape was applied covering the operative field. A sterile gown and sterile gloves were used for the procedure. Initial ultrasound scanning and fluoroscopic imaging demonstrates a recurrent moderate to large pleural effusion. Under direct ultrasound guidance, the inferior lateral pleural space was accessed with a Yueh sheath needle after the overlying soft tissues were anesthetized with 1% lidocaine with epinephrine. An Amplatz super stiff wire was then advanced under fluoroscopy into the  pleural space. A 15.5 French tunneled Pleur-X catheter was tunneled from an incision within the right upper abdominal quadrant to the access site. The pleural access site was serially dilated under fluoroscopy, ultimately allowing placement of a peel-away sheath. The catheter was advanced through the peel-away sheath. The sheath was then removed. Final catheter positioning was confirmed with a fluoroscopic radiographic image. The access incision was closed with an interrupted subcutaneous subcuticular 3-0 Vicryl, then Dermabond was applied at the skin. An 0-silk retention suture was applied at the catheter exit site. Thoracentesis was performed through the new catheter utilizing  provided bulb vacuum assisted drainage bag. Dressings were applied. The patient tolerated the above procedure well without immediate postprocedural complication. FINDINGS: Preprocedural ultrasound scanning demonstrates a recurrent moderate-sized RIGHT sided pleural effusion. After ultrasound and fluoroscopic guided placement, the catheter is directed medial and caudad IMPRESSION: Successful placement of a tunneled RIGHT pleural drainage (PleurX) catheter via lateral approach, as above. Michaelle Birks, MD [facility] Electronically Signed   By: Michaelle Birks M.D.   On: 04/16/2021 15:30     Subjective: Pain controlled, no vomiting. Ready to go to hospice. Abdomen is a bit more distended today than right after paracentesis. No fever or chills reported.   Discharge Exam: Vitals:   04/23/21 2058 04/24/21 0505  BP: (!) 96/58 (!) 97/56  Pulse: 86 84  Resp: 20 16  Temp: 98.1 F (36.7 C) 97.7 F (36.5 C)  SpO2: 90% 90%   General: Chronically ill-appearing male in no distress Cardiovascular: RRR, S1/S2 +, no rubs, no gallops Respiratory: CTA bilaterally, no wheezing, no rhonchi Abdominal: Soft, distended but not taut. No fluid wave. Nontender. Extremities: Stable edema.  Jaundice.   Labs: BNP (last 3 results) Recent Labs    04/21/21 1332  BNP 53.6   Basic Metabolic Panel: Recent Labs  Lab 04/18/21 1030 04/21/21 0809 04/21/21 0816 04/22/21 0336 04/23/21 0356  NA 130* 126* 126* 129* 128*  K 5.3* 5.7* 5.7* 5.6* 6.1*  CL 100 96* 100 95* 98  CO2 20* 19*  --  21* 19*  GLUCOSE 106* 113* 109* 101* 93  BUN 76* 83* 91* 84* 90*  CREATININE 1.10 1.34* 1.60* 1.49* 1.80*  CALCIUM 8.5* 8.4*  --  8.5* 8.1*   Liver Function Tests: Recent Labs  Lab 04/21/21 0809 04/22/21 0336 04/23/21 0356  AST 104* 100* 86*  ALT _0 ALKPHOS 697* 524* 466*  BILITOT 0.8 0.7 0.7  PROT 6.4* 6.7 5.8*  ALBUMIN 1.8* 2.3* 2.2*   Recent Labs  Lab 04/21/21 0809  LIPASE 23   No results for input(s): AMMONIA in the last 168 hours. CBC: Recent Labs  Lab 04/18/21 0943 04/21/21 0809 04/21/21 0816 04/22/21 0336 04/23/21 0356 04/23/21 0840  WBC 11.0* 12.1*  --  12.2* 9.2  --   NEUTROABS 9.3* 10.3*  --   --   --   --   HGB 8.6* 8.7* 8.8* 8.5* 7.4* 7.5*  HCT 27.3* 28.7* 26.0* 28.3* 24.6* 24.9*  MCV 95.5 96.0  --  98.6 99.2  --   PLT 309 262  --  246 206  --    Cardiac Enzymes: No results for input(s): CKTOTAL, CKMB, CKMBINDEX, TROPONINI in the last 168 hours. BNP: Invalid input(s): POCBNP CBG: Recent Labs  Lab 04/22/21 2140 04/23/21 0724 04/23/21 1102 04/23/21 1614 04/23/21 2054  GLUCAP 92 88 114* 96 95   D-Dimer No results for input(s): DDIMER in the last 72 hours. Hgb A1c No results for input(s): HGBA1C in the last 72 hours. Lipid Profile No results for input(s): CHOL, HDL, LDLCALC, TRIG, CHOLHDL, LDLDIRECT in the last 72 hours. Thyroid function studies No results for input(s): TSH, T4TOTAL, T3FREE, THYROIDAB in the last 72 hours.  Invalid input(s): FREET3 Anemia work up No results for input(s): VITAMINB12, FOLATE, FERRITIN, TIBC, IRON, RETICCTPCT in the last 72 hours. Urinalysis    Component Value Date/Time   COLORURINE YELLOW 04/21/2021  0809   APPEARANCEUR HAZY (A) 04/21/2021 0809   LABSPEC 1.032 (H) 04/21/2021 0809   PHURINE 5.0 04/21/2021 0809  GLUCOSEU NEGATIVE 04/21/2021 0809   HGBUR NEGATIVE 04/21/2021 0809   BILIRUBINUR NEGATIVE 04/21/2021 0809   KETONESUR NEGATIVE 04/21/2021 0809   PROTEINUR 30 (A) 04/21/2021 0809   UROBILINOGEN 0.2 12/05/2013 2348   NITRITE NEGATIVE 04/21/2021 0809   LEUKOCYTESUR NEGATIVE 04/21/2021 0809    Microbiology Recent Results (from the past 240 hour(s))  Resp Panel by RT-PCR (Flu A&B, Covid) Nasopharyngeal Swab     Status: None   Collection Time: 04/21/21  8:09 AM   Specimen: Nasopharyngeal Swab; Nasopharyngeal(NP) swabs in vial transport medium  Result Value Ref Range Status   SARS Coronavirus 2 by RT PCR NEGATIVE NEGATIVE Final    Comment: (NOTE) SARS-CoV-2 target nucleic acids are NOT DETECTED.  The SARS-CoV-2 RNA is generally detectable in upper respiratory specimens during the acute phase of infection. The lowest concentration of SARS-CoV-2 viral copies this assay can detect is 138 copies/mL. A negative result does not preclude SARS-Cov-2 infection and should not be used as the sole basis for treatment or other patient management decisions. A negative result may occur with  improper specimen collection/handling, submission of specimen other than nasopharyngeal swab, presence of viral mutation(s) within the areas targeted by this assay, and inadequate number of viral copies(<138 copies/mL). A negative result must be combined with clinical observations, patient history, and epidemiological information. The expected result is Negative.  Fact Sheet for Patients:  EntrepreneurPulse.com.au  Fact Sheet for Healthcare Providers:  IncredibleEmployment.be  This test is no t yet approved or cleared by the [place] FDA and  has been authorized for detection and/or diagnosis of SARS-CoV-2 by FDA under an Emergency Use Authorization (EUA).  This EUA will remain  in effect (meaning this test can be used) for the duration of the COVID-19 declaration under Section 564(b)(1) of the Act, 21 U.S.C.section 360bbb-3(b)(1), unless the authorization is terminated  or revoked sooner.       Influenza A by PCR NEGATIVE NEGATIVE Final   Influenza B by PCR NEGATIVE NEGATIVE Final    Comment: (NOTE) The Xpert Xpress SARS-CoV-2/FLU/RSV plus assay is intended as an aid in the diagnosis of influenza from Nasopharyngeal swab specimens and should not be used as a sole basis for treatment. Nasal washings and aspirates are unacceptable for Xpert Xpress SARS-CoV-2/FLU/RSV testing.  Fact Sheet for Patients: EntrepreneurPulse.com.au  Fact Sheet for Healthcare Providers: IncredibleEmployment.be  This test is not yet approved or cleared by the [place] FDA and has been authorized for detection and/or diagnosis of SARS-CoV-2 by FDA under an Emergency Use Authorization (EUA). This EUA will remain in effect (meaning this test can be used) for the duration of the COVID-19 declaration under Section 564(b)(1) of the Act, 21 U.S.C. section 360bbb-3(b)(1), unless the authorization is terminated or revoked.  Performed at [facility], [place] [address], [place], [place] [address]   Blood culture (routine x 2)     Status: None (Preliminary result)   Collection Time: 04/21/21  8:09 AM   Specimen: BLOOD  Result Value Ref Range Status   Specimen Description   Final    BLOOD RIGHT ANTECUBITAL Performed at [place] [address], [place], [place] [address]    Special Requests   Final    BOTTLES DRAWN AEROBIC AND ANAEROBIC Blood Culture adequate volume Performed at [place] [address] [place], [place] [address]    Culture   Final    NO GROWTH 3 DAYS Performed at [facility], [place],  [place] [address]    Report  Status PENDING  Incomplete  Body fluid culture w Gram Stain     Status: None (Preliminary result)   Collection Time: 04/22/21  1:31 PM   Specimen: PATH Cytology Peritoneal fluid  Result Value Ref Range Status   Specimen Description   Final    PERITONEAL Performed at [place] [address], [place], [place] [address]    Special Requests   Final    NONE Performed at [facility], [place] [address] [place], [place] [address]    Gram Stain   Final    NO SQUAMOUS EPITHELIAL CELLS SEEN NO WBC SEEN NO ORGANISMS SEEN    Culture   Final    NO GROWTH 2 DAYS Performed at [facility], [place] [address] [place],  [address]    Report Status PENDING  Incomplete    Time coordinating discharge: Approximately 40 minutes  Patrecia Pour, MD  [facility] 04/24/2021, 11:32 AM

## 2021-04-24 NOTE — TOC Progression Note (Signed)
Transition of Care [facility]) - Progression Note    Patient Details  Name: Travis Bowman MRN: [id number] Date of Birth: [birth year]  [facility] CM/SW Contact  Purcell Mouton, RN Phone Number: 04/24/2021, 11:22 AM  Clinical Narrative:    Pt now have a bed at [facility], will need discharge summary.    Expected Discharge Plan: [place] Barriers to Discharge: No Barriers Identified  Expected Discharge Plan and Services Expected Discharge Plan: [place]   Discharge Planning Services: CM Consult Post Acute Care Choice: Hospice Living arrangements for the past 2 months: Single Family Home Expected Discharge Date: 04/24/21                                     Social Determinants of Health (SDOH) Interventions    Readmission Risk Interventions Readmission Risk Prevention Plan 04/09/2021  Post Dischage Appt Complete  Medication Screening Complete  Transportation Screening Complete  Some recent data might be hidden

## 2021-04-24 NOTE — Progress Notes (Signed)
Daily Progress Note   Patient Name: Travis Bowman       Date: 04/24/2021 DOB: [birth year]  Age: 60 y.o. MRN#: [id number] Attending Physician: Patrecia Pour, MD Primary Care Physician: Lujean Amel, MD Admit Date: 04/21/2021  Reason for Consultation/Follow-up: Establishing goals of care  Subjective:  Awake resting in chair No appetite,abdomen more distended, appears more icteric. Awaiting hospice bed.   Length of Stay: 2  Current Medications: Scheduled Meds:  . apixaban  5 mg Oral BID  . diclofenac Sodium  2 g Topical QID  . mouth rinse  15 mL Mouth Rinse BID  . midodrine  5 mg Oral TID WC  . pantoprazole (PROTONIX) IV  40 mg Intravenous Q12H  . polyethylene glycol  17 g Oral Daily  . senna-docusate  1 tablet Oral BID  . tamsulosin  0.4 mg Oral Daily    Continuous Infusions:   PRN Meds: acetaminophen **OR** acetaminophen, alum & mag hydroxide-simeth, calcium carbonate, HYDROmorphone, lip balm, ondansetron **OR** ondansetron (ZOFRAN) IV, oxyCODONE  Physical Exam         Generalized weakness Mild distress Abdomen distended and firm Has some edema both LE, in dressing Awake alert, answers questions appropriately.  Appears icteric  Vital Signs: BP (!) 97/56 (BP Location: Left Arm)   Pulse 84   Temp 97.7 F (36.5 C) (Oral)   Resp 16   Ht 5\' 8"  (1.727 m)   Wt 100.7 kg   SpO2 90%   BMI 33.75 kg/m  SpO2: SpO2: 90 % O2 Device: O2 Device: Room Air O2 Flow Rate: O2 Flow Rate (L/min): 2 L/min  Intake/output summary:  Intake/Output Summary (Last 24 hours) at 04/24/2021 1045 Last data filed at 04/24/2021 0515 Gross per 24 hour  Intake 360 ml  Output 900 ml  Net -540 ml   LBM: Last BM Date: 04/21/21 Baseline Weight: Weight: 100.7 kg Most recent weight: Weight:  100.7 kg       Palliative Assessment/Data:      Patient Active Problem List   Diagnosis Date Noted  . Prostate cancer metastatic to bone ([place]) 04/22/2021  . Pressure injury of skin 04/22/2021  . Hypotension 04/21/2021  . AKI (acute kidney injury) ([place]) 04/21/2021  . Severe protein-calorie malnutrition ([place]) 04/21/2021  . Normocytic anemia 04/21/2021  . Hyperkalemia 04/21/2021  . Lactic acidosis  04/21/2021  . Hyponatremia 04/21/2021  . Ascites, malignant 04/21/2021  . Acute pulmonary embolism, unspecified pulmonary embolism type, unspecified whether acute cor pulmonale present ([place]) 04/08/2021  . COVID-19 03/19/2021  . Goals of care, counseling/discussion 06/04/2020  . Malignant neoplasm of prostate ([place]) 12/25/2015  . S/P right knee arthroscopy 02/08/2015  . GERD (gastroesophageal reflux disease) 09/01/2014  . Proteinuria 12/06/2013  . Hematuria 12/06/2013  . Elevated brain natriuretic peptide (BNP) level 12/06/2013  . Hypertensive urgency, malignant 12/05/2013  . Type 2 diabetes mellitus ([place]) 12/05/2013  . Hypertension 11/03/2011  . Nephrolithiasis 11/03/2011    Palliative Care Assessment & Plan   Patient Profile:  60 y.o. 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  Patient was found to have increasing volume of ascites with concerns for progression of metastatic prostate cancer   Advanced prostate cancer with metastatic disease to the bone and lymphadenopathy.  He has progressed on multiple therapies and  after  discussion with his oncologist on 04-22-21, he opted to discontinue all cancer treatment and proceed with hospice care.      Assessment:  Ongoing functional decline Escalating pain management needs Minimal Po intake  Recommendations/Plan:  Residential hospice: re discussed with patient and wife about home with hospice versus residential hospice again.    Goals of Care and Additional Recommendations: Limitations on Scope of Treatment: Full Comfort Care  Code Status:    Code Status Orders  (From admission, onward)           Start     Ordered   04/22/21 0812  Do not attempt resuscitation (DNR)  Continuous       Question Answer Comment  In the event of cardiac or respiratory ARREST Do not call a "code blue"   In the event of cardiac or respiratory ARREST Do not perform Intubation, CPR, defibrillation or ACLS   In the event of cardiac or respiratory ARREST Use medication by any route, position, wound care, and other measures to relive pain and suffering. May use oxygen, suction and manual treatment of airway obstruction as needed for comfort.      04/22/21 0811           Code Status History     Date Active Date Inactive Code Status Order ID Comments User Context   04/21/2021 1101 04/22/2021 0811 Full Code [id number]  Reubin Milan, MD ED   04/08/2021 1506 04/09/2021 2123 Full Code [id number]  Little Ishikawa, MD ED   03/20/2021 0122 03/22/2021 2032 Full Code [id number]  Mansy, Arvella Merles, MD ED   01/28/2016 1845 01/29/2016 1528 Full Code [id number]  Raynelle Bring, MD Inpatient   12/07/2013 1610 12/09/2013 1530 Full Code [id number]  Jacqulynn Cadet, MD Inpatient   12/05/2013 2211 12/07/2013 1610 Full Code [id number]  Toy Baker, MD Inpatient       Prognosis:  < 2 weeks  Discharge Planning: Hospice facility  Care plan was discussed with  patient and wife.   Thank you for allowing the Palliative Medicine Team to assist in the care of this patient.   Time In: 9  Time Out: 9.25 Total Time 25  Prolonged Time Billed  no       Greater than 50%  of this time was spent counseling and coordinating care related to the above assessment and plan.  Loistine Chance, MD  Please contact Palliative Medicine Team phone at [phone] for questions and concerns.

## 2021-04-26 LAB — LIPASE, FLUID: Lipase-Fluid: 8 U/L

## 2021-04-26 LAB — BODY FLUID CULTURE W GRAM STAIN
Culture: NO GROWTH
Gram Stain: NONE SEEN

## 2021-04-26 LAB — CULTURE, BLOOD (ROUTINE X 2)
Culture: NO GROWTH
Special Requests: ADEQUATE

## 2021-04-30 ENCOUNTER — Ambulatory Visit: Payer: 59 | Admitting: Oncology

## 2021-04-30 ENCOUNTER — Ambulatory Visit: Payer: 59

## 2021-04-30 ENCOUNTER — Other Ambulatory Visit: Payer: 59

## 2021-04-30 DEATH — deceased

## 2021-05-21 ENCOUNTER — Other Ambulatory Visit: Payer: 59

## 2021-05-30 ENCOUNTER — Other Ambulatory Visit (HOSPITAL_COMMUNITY): Payer: 59

## 2021-06-25 ENCOUNTER — Encounter: Payer: Self-pay | Admitting: Oncology

## 2021-06-28 ENCOUNTER — Other Ambulatory Visit: Payer: Self-pay | Admitting: *Deleted

## 2021-06-28 DIAGNOSIS — C61 Malignant neoplasm of prostate: Secondary | ICD-10-CM

## 2021-07-02 ENCOUNTER — Other Ambulatory Visit: Payer: 59

## 2021-07-11 ENCOUNTER — Other Ambulatory Visit (HOSPITAL_COMMUNITY): Payer: 59

## 2021-08-13 ENCOUNTER — Inpatient Hospital Stay: Payer: Self-pay | Attending: Oncology

## 2021-09-24 ENCOUNTER — Inpatient Hospital Stay: Payer: Self-pay | Attending: Oncology

## 2021-11-05 ENCOUNTER — Inpatient Hospital Stay: Payer: Self-pay

## 2021-12-09 ENCOUNTER — Other Ambulatory Visit (HOSPITAL_COMMUNITY): Payer: Self-pay

## 2022-09-30 IMAGING — CR DG CHEST 2V
2 series · 2 of 2 positions shown · non-contrast
Comparison: 01/24/2016

CLINICAL DATA: Cough

EXAM:
CHEST - 2 VIEW

[w chest pa]
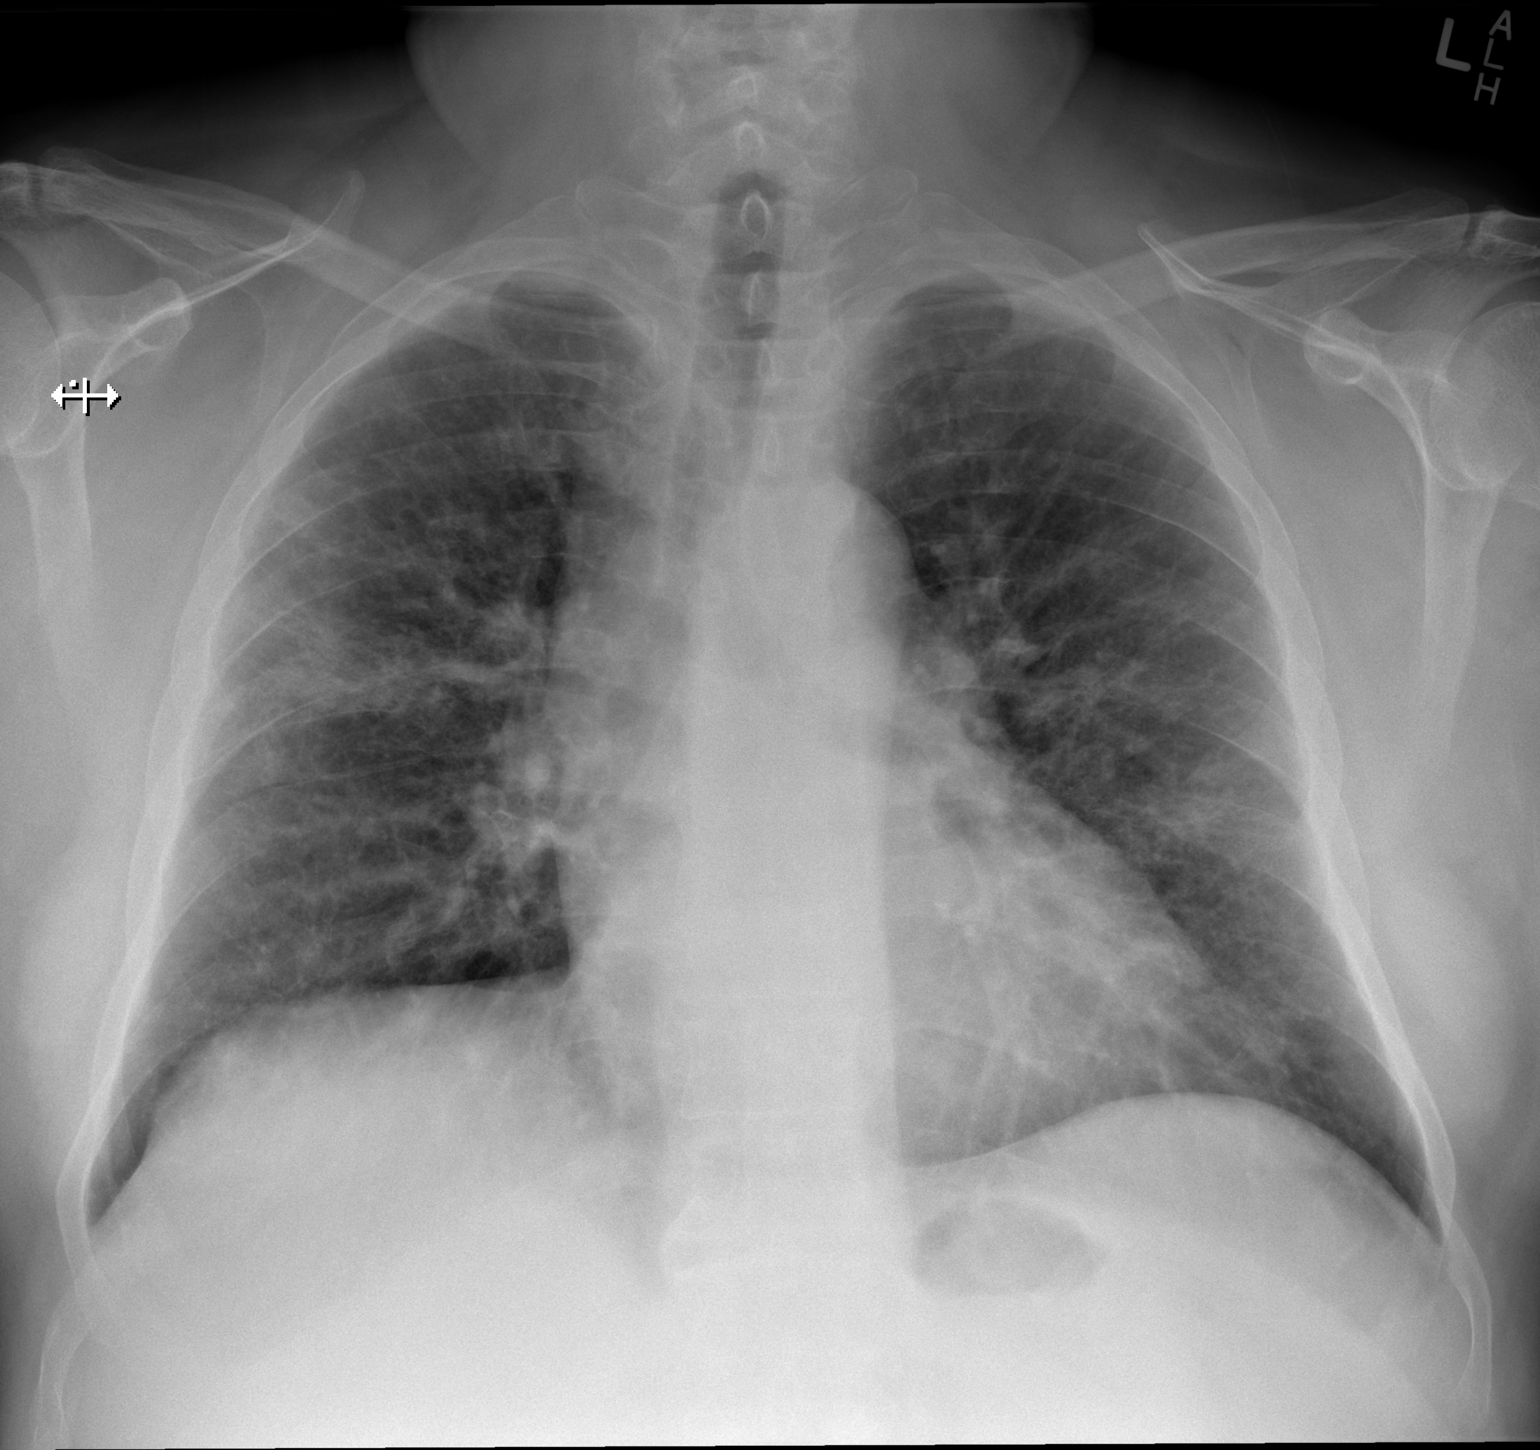

[w chest lat]
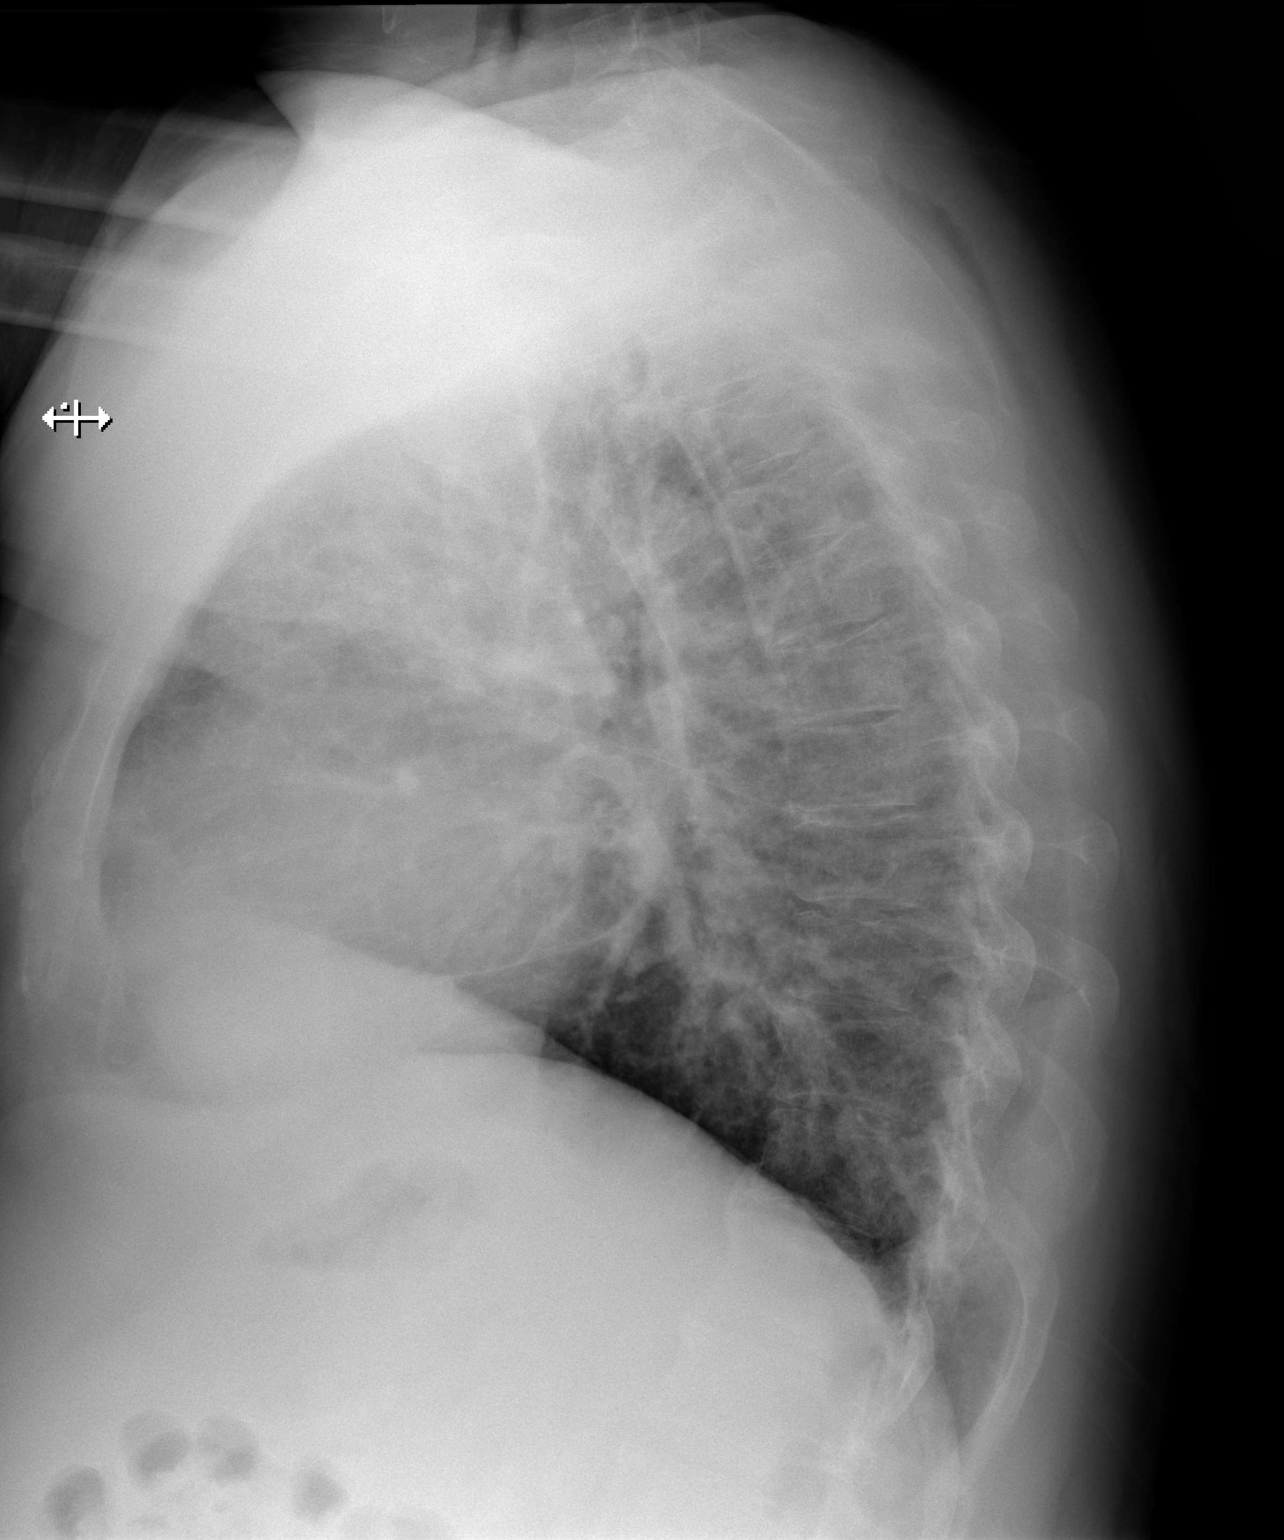

[2 of 2 positions shown; findings below may reference images not displayed]

FINDINGS: The cardiomediastinal silhouette is unremarkable.

Patchy airspace opacities within both lungs noted, likely
representing infection/pneumonia.

Mild elevation of the RIGHT hemidiaphragm again noted.

No pleural effusion pneumothorax.
IMPRESSION: Patchy airspace opacities within both lungs likely representing
infection/pneumonia. Radiographic follow-up to resolution is
recommended.
